# Patient Record
Sex: Male | Born: 1935 | Race: White | Hispanic: No | Smoking: Former smoker
Health system: Southern US, Community
[De-identification: ages and names within clinical notes are randomized; demographics above are authoritative.]

## PROBLEM LIST (undated history)

## (undated) DIAGNOSIS — M47812 Spondylosis without myelopathy or radiculopathy, cervical region: Secondary | ICD-10-CM

## (undated) DIAGNOSIS — M1990 Unspecified osteoarthritis, unspecified site: Secondary | ICD-10-CM

## (undated) DIAGNOSIS — I10 Essential (primary) hypertension: Secondary | ICD-10-CM

## (undated) DIAGNOSIS — K219 Gastro-esophageal reflux disease without esophagitis: Secondary | ICD-10-CM

## (undated) DIAGNOSIS — G4733 Obstructive sleep apnea (adult) (pediatric): Secondary | ICD-10-CM

## (undated) DIAGNOSIS — J449 Chronic obstructive pulmonary disease, unspecified: Secondary | ICD-10-CM

## (undated) DIAGNOSIS — I4891 Unspecified atrial fibrillation: Secondary | ICD-10-CM

## (undated) DIAGNOSIS — K5730 Diverticulosis of large intestine without perforation or abscess without bleeding: Secondary | ICD-10-CM

## (undated) DIAGNOSIS — C61 Malignant neoplasm of prostate: Secondary | ICD-10-CM

## (undated) HISTORY — DX: Spondylosis without myelopathy or radiculopathy, cervical region: M47.812

## (undated) HISTORY — DX: Essential (primary) hypertension: I10

## (undated) HISTORY — PX: COLONOSCOPY: SHX174

## (undated) HISTORY — PX: TOTAL KNEE ARTHROPLASTY: SHX125

## (undated) HISTORY — DX: Unspecified atrial fibrillation: I48.91

## (undated) HISTORY — DX: Chronic obstructive pulmonary disease, unspecified: J44.9

## (undated) HISTORY — DX: Malignant neoplasm of prostate: C61

## (undated) HISTORY — DX: Unspecified osteoarthritis, unspecified site: M19.90

## (undated) HISTORY — DX: Obstructive sleep apnea (adult) (pediatric): G47.33

## (undated) HISTORY — DX: Diverticulosis of large intestine without perforation or abscess without bleeding: K57.30

## (undated) HISTORY — DX: Gastro-esophageal reflux disease without esophagitis: K21.9

## (undated) HISTORY — PX: ROTATOR CUFF REPAIR: SHX139

---

## 2000-05-10 ENCOUNTER — Encounter: Payer: Self-pay | Admitting: Orthopedic Surgery

## 2000-05-13 ENCOUNTER — Inpatient Hospital Stay (HOSPITAL_COMMUNITY): Admission: RE | Admit: 2000-05-13 | Discharge: 2000-05-17 | Payer: Self-pay | Admitting: Orthopedic Surgery

## 2000-05-13 ENCOUNTER — Encounter: Payer: Self-pay | Admitting: Orthopedic Surgery

## 2004-06-05 ENCOUNTER — Ambulatory Visit: Payer: Self-pay | Admitting: Cardiology

## 2004-06-09 ENCOUNTER — Ambulatory Visit: Payer: Self-pay | Admitting: Cardiology

## 2004-06-11 ENCOUNTER — Ambulatory Visit: Payer: Self-pay | Admitting: Cardiology

## 2004-06-12 ENCOUNTER — Inpatient Hospital Stay (HOSPITAL_BASED_OUTPATIENT_CLINIC_OR_DEPARTMENT_OTHER): Admission: RE | Admit: 2004-06-12 | Discharge: 2004-06-12 | Payer: Self-pay | Admitting: Cardiology

## 2004-06-17 ENCOUNTER — Ambulatory Visit: Payer: Self-pay | Admitting: Cardiology

## 2004-07-09 ENCOUNTER — Inpatient Hospital Stay (HOSPITAL_COMMUNITY): Admission: RE | Admit: 2004-07-09 | Discharge: 2004-07-13 | Payer: Self-pay | Admitting: Orthopedic Surgery

## 2004-07-10 ENCOUNTER — Ambulatory Visit: Payer: Self-pay | Admitting: Physical Medicine & Rehabilitation

## 2007-03-07 ENCOUNTER — Ambulatory Visit: Payer: Self-pay | Admitting: Cardiology

## 2007-03-13 ENCOUNTER — Ambulatory Visit: Payer: Self-pay | Admitting: Cardiology

## 2007-04-20 ENCOUNTER — Ambulatory Visit: Payer: Self-pay | Admitting: Cardiology

## 2007-05-15 ENCOUNTER — Encounter: Payer: Self-pay | Admitting: Cardiology

## 2007-06-13 ENCOUNTER — Ambulatory Visit: Payer: Self-pay | Admitting: Cardiology

## 2007-07-21 ENCOUNTER — Encounter: Admission: RE | Admit: 2007-07-21 | Discharge: 2007-07-21 | Payer: Self-pay | Admitting: Orthopedic Surgery

## 2007-08-10 ENCOUNTER — Ambulatory Visit: Payer: Self-pay | Admitting: Cardiology

## 2007-08-16 ENCOUNTER — Encounter: Admission: RE | Admit: 2007-08-16 | Discharge: 2007-08-16 | Payer: Self-pay | Admitting: Orthopedic Surgery

## 2007-08-16 ENCOUNTER — Encounter: Payer: Self-pay | Admitting: Cardiology

## 2007-08-17 ENCOUNTER — Ambulatory Visit (HOSPITAL_BASED_OUTPATIENT_CLINIC_OR_DEPARTMENT_OTHER): Admission: RE | Admit: 2007-08-17 | Discharge: 2007-08-18 | Payer: Self-pay | Admitting: Orthopedic Surgery

## 2008-02-26 ENCOUNTER — Ambulatory Visit: Payer: Self-pay | Admitting: Cardiology

## 2008-09-09 ENCOUNTER — Encounter: Payer: Self-pay | Admitting: Cardiology

## 2008-10-11 ENCOUNTER — Ambulatory Visit: Payer: Self-pay | Admitting: Cardiology

## 2008-10-16 ENCOUNTER — Ambulatory Visit: Payer: Self-pay | Admitting: Cardiology

## 2008-10-21 ENCOUNTER — Encounter: Payer: Self-pay | Admitting: Cardiology

## 2008-10-30 ENCOUNTER — Encounter: Payer: Self-pay | Admitting: Cardiology

## 2008-11-01 ENCOUNTER — Encounter: Payer: Self-pay | Admitting: Physician Assistant

## 2008-12-26 ENCOUNTER — Ambulatory Visit (HOSPITAL_COMMUNITY): Admission: RE | Admit: 2008-12-26 | Discharge: 2008-12-26 | Payer: Self-pay | Admitting: Ophthalmology

## 2009-01-03 ENCOUNTER — Encounter: Payer: Self-pay | Admitting: Cardiology

## 2009-03-08 ENCOUNTER — Encounter: Payer: Self-pay | Admitting: Cardiology

## 2009-03-28 DIAGNOSIS — I4891 Unspecified atrial fibrillation: Secondary | ICD-10-CM

## 2009-04-28 ENCOUNTER — Ambulatory Visit: Payer: Self-pay | Admitting: Cardiology

## 2009-04-28 ENCOUNTER — Encounter: Payer: Self-pay | Admitting: Physician Assistant

## 2009-05-19 ENCOUNTER — Encounter: Payer: Self-pay | Admitting: Cardiology

## 2009-05-21 ENCOUNTER — Encounter: Payer: Self-pay | Admitting: Cardiology

## 2009-07-17 ENCOUNTER — Encounter: Payer: Self-pay | Admitting: Cardiology

## 2009-10-02 ENCOUNTER — Encounter: Payer: Self-pay | Admitting: Cardiology

## 2009-10-20 ENCOUNTER — Encounter: Payer: Self-pay | Admitting: Cardiology

## 2009-10-28 ENCOUNTER — Ambulatory Visit: Payer: Self-pay | Admitting: Cardiology

## 2009-11-20 ENCOUNTER — Encounter: Payer: Self-pay | Admitting: Cardiology

## 2009-12-03 ENCOUNTER — Encounter: Payer: Self-pay | Admitting: Cardiology

## 2010-02-16 ENCOUNTER — Ambulatory Visit: Payer: Self-pay | Admitting: Cardiology

## 2010-04-29 ENCOUNTER — Encounter: Payer: Self-pay | Admitting: Cardiology

## 2010-05-07 ENCOUNTER — Ambulatory Visit (HOSPITAL_COMMUNITY): Admission: RE | Admit: 2010-05-07 | Discharge: 2010-05-07 | Payer: Self-pay | Admitting: Ophthalmology

## 2010-07-27 ENCOUNTER — Ambulatory Visit
Admission: RE | Admit: 2010-07-27 | Discharge: 2010-07-27 | Payer: Self-pay | Source: Home / Self Care | Attending: Cardiology | Admitting: Cardiology

## 2010-07-27 ENCOUNTER — Encounter: Payer: Self-pay | Admitting: Cardiology

## 2010-07-28 NOTE — Assessment & Plan Note (Signed)
Summary: 3 MO FU AUG REMINDER-SRS   Visit Type:  Follow-up Primary Izaiha Lo:  Dr. Kirstie Peri   History of Present Illness: 77 year old male presents for followup. At his last visit, Lopressor was advanced for more optimal heart rate control. He has tolerated this change. Home blood pressure and heart rate checks show systolics ranging from 100 -110 in general, and heart rates in the 70s. He reports NYHA class II dyspnea on exertion, no chest pain, or progressive palpitations.  No reported bleeding problems on Coumadin.  He states he had pulmonary function tests done through the [facility] system, reportedly "okay," although Spiriva was suggested. He has preferred not to take this however, using Combivent as needed.  Mr. Bardon states that his wife passed away 3 weeks ago with cancer. We talked about this some day.   Preventive Screening-Counseling & Management  Alcohol-Tobacco     Smoking Status: quit     Year Quit: 1970  Current Medications (verified): 1)  Metoprolol Tartrate 50 Mg Tabs (Metoprolol Tartrate) .... Take 1/2 Tablet By Mouth Two Times A Day 2)  Allopurinol 300 Mg Tabs (Allopurinol) .... Take 1 Tablet By Mouth Once A Day 3)  Warfarin Sodium 5 Mg Tabs (Warfarin Sodium) .... Use As Directed 4)  Vitamin B-12 1000 Mcg Tabs (Cyanocobalamin) .... Take 1 Tablet By Mouth Once A Day 5)  Preservision Areds  Caps (Multiple Vitamins-Minerals) .... Take 1 Tablet By Mouth Once A Day 6)  Nexium 40 Mg Cpdr (Esomeprazole Magnesium) .... Take 1 Tablet By Mouth Once A Day 7)  Metoprolol Tartrate 100 Mg Tabs (Metoprolol Tartrate) .... Take 1/2 Tablet By Mouth Twice A Day in Addition To 1/2 of 50mg  Tablet Two Times A Day. 8)  Combivent 18-103 Mcg/act Aero (Ipratropium-Albuterol) .... Use As Directed As Needed  Allergies (verified): 1)  ! Sulfa  Comments:  Nurse/Medical Assistant: The patient's medication list and allergies were reviewed with the patient and were  updated in the Medication and Allergy Lists.  Past History:  Past Medical History: Last updated: 10/28/2009 Mild obstructive sleep apnea Probable COPD Osteoarthritis Atrial Fibrillation Normal coronaries by catheterization 2005  Social History: Last updated: 03/28/2009 Retired  Married  Tobacco Use - Former.  Alcohol Use - no  Review of Systems       The patient complains of dyspnea on exertion.  The patient denies anorexia, fever, chest pain, syncope, peripheral edema, hemoptysis, melena, and hematochezia.         Otherwise reviewed and negative.  Vital Signs:  Patient profile:   77 year old male Height:      70 inches Weight:      235 pounds BMI:     33.84 Pulse rate:   60 / minute BP sitting:   103 / 69  (left arm) Cuff size:   large  Vitals Entered By: Carlye Grippe (February 16, 2010 8:43 AM)  Nutrition Counseling: Patient's BMI is greater than 25 and therefore counseled on weight management options.  Physical Exam  Additional Exam:  Obese male in no acute distress.  HEENT: Conjunctiva and lids normal, oropharynx with moist mucosa. Neck: Supple, no elevated JVP. Lungs: Clear to auscultation, diminished, no wheezing. Cardiac: Irregularly irregular, no S3 or rub. Abdomen: Nontender, bowel sounds present, unable to palpate liver edge. Extremities: No pitting edema. Distal pulses are 2+.   Impression & Recommendations:  Problem # 1:  ATRIAL FIBRILLATION (ICD-427.31)  Symptomatically stable. Plan to continue Lopressor at 75 mg p.o. b.i.d. Refill given. No  other medication changes are made. I will see him back in 6 months.  His updated medication list for this problem includes:    Metoprolol Tartrate 25 Mg Tabs (Metoprolol tartrate) .[name]... Take one tablet by mouth twice a day    Warfarin Sodium 5 Mg Tabs (Warfarin sodium) ..... Use as directed    Metoprolol Tartrate 100 Mg Tabs (Metoprolol tartrate) .[name]... Take 1/2 tablet by mouth twice a day in addition to 1/2  of 50mg  tablet two times a day.  Patient Instructions: 1)  Your physician wants you to follow-up in: 6 months. You will receive a reminder letter in the mail one-two months in advance. If you don't receive a letter, please call our office to schedule the follow-up appointment. 2)  Your physician recommends that you continue on your current medications as directed. Please refer to the Current Medication list given to you today. Prescriptions: METOPROLOL TARTRATE 25 MG TABS (METOPROLOL TARTRATE) Take one tablet by mouth twice a day  #180 x 3   Entered by:   Cyril Loosen, RN, BSN   Authorized by:   Loreli Slot, MD, [facility]   Signed by:   Cyril Loosen, RN, BSN on 02/16/2010   Method used:   Electronically to        [facility]* (mail-order)             , [place]         Ph: [phone]       Fax: [phone]   RxID:   [id number]

## 2010-07-28 NOTE — Assessment & Plan Note (Signed)
Summary: 6 MO FU PER MAY REMINDER   Visit Type:  Follow-up Primary Provider:  Dr. Kirstie Peri   History of Present Illness: 77 year old male presents for a followup visit. He was seen at the [facility] system for followup evaluation, and is undergoing a battery of testing related to exertional fatigue. We have been managing him for persistent atrial fibrillation with the strategy of heart rate control and anticoagulation. He has previously documented normal coronary arteries.  He is to undergo pulmonary function testing. I note that he was referred for a standard treadmill test, with limited results indicating relatively rapid ventricular response in stage I limiting his functional capacity, however done off his beta blocker. Echocardiography results are not completely available, however preliminary findings suggest no significant left ventricular dysfunction. It was recommended that he increase his Lopressor to 100 mg p.o. b.i.d. He has not done this yet, being concerned about making such an adjustment.  Followup labs from 7 April revealed BUN 12, creatinine 0.8, AST 21, ALT 9, total cholesterol 141, triglycerides 83, HDL 37, LDL 87, potassium 3.8, TSH 1.2, hemoglobin 13.8.  Today I spoke with Nicholas [name] at length about his atrial fibrillation. It is certainly possible that rapid ventricular response with exertion it is at least partially responsible for his sensation of fatigue and shortness of breath. Over time he has not been shown to have any significant ischemic heart disease or left ventricular dysfunction. I asked him to go ahead with pulmonary function tests as planned, and we arrived at a plan to increase his Lopressor to 75 mg p.o. b.i.d. as an intermediate dose. He will keep an eye on his heart rate and blood pressure.   Current Medications (verified): 1)  Metoprolol Tartrate 50 Mg Tabs (Metoprolol Tartrate) .... Take 1 Tablet By Mouth Two Times A Day 2)   Allopurinol 300 Mg Tabs (Allopurinol) .... Take 1 Tablet By Mouth Once A Day 3)  Warfarin Sodium 5 Mg Tabs (Warfarin Sodium) .... Use As Directed 4)  Vitamin B-12 1000 Mcg Tabs (Cyanocobalamin) .... Take 1 Tablet By Mouth Once A Day 5)  Preservision Areds  Caps (Multiple Vitamins-Minerals) .... Take 1 Tablet By Mouth Once A Day 6)  Nexium 40 Mg Cpdr (Esomeprazole Magnesium) .... Take 1 Tablet By Mouth Once A Day 7)  Symbicort 80-4.5 Mcg/act Aero (Budesonide-Formoterol Fumarate) .... As Needed  Allergies (verified): 1)  ! Sulfa  Past History:  Social History: Last updated: 03/28/2009 Retired  Married  Tobacco Use - Former.  Alcohol Use - no  Past Medical History: Mild obstructive sleep apnea Probable COPD Osteoarthritis Atrial Fibrillation Normal coronaries by catheterization 2005  Review of Systems       The patient complains of dyspnea on exertion.  The patient denies anorexia, fever, chest pain, syncope, peripheral edema, prolonged cough, melena, and hematochezia.         Otherwise reviewed and negative.  Vital Signs:  Patient profile:   77 year old male Weight:      240 pounds Pulse rate:   82 / minute BP sitting:   101 / 70  (right arm)  Vitals Entered By: Dreama Saa, CNA (Oct 28, 2009 2:38 PM)  Physical Exam  Additional Exam:  Obese male in no acute distress.  HEENT: Conjunctiva and lids normal, oropharynx with moist mucosa. Neck: Supple, no elevated JVP. Lungs: Clear to auscultation, diminished, no wheezing. Cardiac: Irregularly irregular, no S3 or rub. Abdomen: Nontender, bowel sounds present, unable to palpate liver edge.  Extremities: No pitting edema. Distal pulses are 2+.   EKG  Procedure date:  10/28/2009  Findings:      Atrial fibrillation at 77 beats per minute with leftward axis, small R prime in lead V1, and nonspecific T-wave changes.  Impression & Recommendations:  Problem # 1:  ATRIAL FIBRILLATION (ICD-427.31)  Persistent/permanent  atrial fibrillation, rate control reasonable at rest, however possibly with rapid ventricular response on exertion contributing to fatigue and breathlessness. We have discussed this in the past. Recent testing done through the [facility] was reviewed. He does have pulmonary function tests pending. I asked him to increase his Lopressor to 75 mg p.o. b.i.d. using a combination of one half pills of his 50 mg and 100 mg doses, and keeping an eye on his blood pressure and heart rate over the next few weeks. He will let us know how he is doing, otherwise we will see him back in 3 months. He also plans to inform us of his final test results when available.  His updated medication list for this problem includes:    Metoprolol Tartrate 50 Mg Tabs (Metoprolol tartrate) .[name]... Take 1/2 tablet by mouth two times a day    Warfarin Sodium 5 Mg Tabs (Warfarin sodium) ..... Use as directed    Metoprolol Tartrate 100 Mg Tabs (Metoprolol tartrate) .[name]... Take 1/2 tablet by mouth twice a day in addition to 1/2 of 50mg  tablet two times a day.  Patient Instructions: 1)  Your physician wants you to follow-up in: 3 months. You will receive a reminder letter in the mail one-two months in advance. If you don't receive a letter, please call our office to schedule the follow-up appointment. 2)  Take Metoprolol 50mg  1/2 tablet two times a day in addition to 100mg  1/2 tablet two times a day for a total of 75mg  by mouth two times a day.

## 2010-07-28 NOTE — Letter (Signed)
Summary: [facility] Cardiology Consult  [facility] Cardiology Consult   Imported By: Cyril Loosen, RN, BSN 10/28/2009 15:36:04  _____________________________________________________________________  External Attachment:    Type:   Image     Comment:   External Document

## 2010-08-05 NOTE — Assessment & Plan Note (Signed)
Summary: 6 mo ful   Visit Type:  Follow-up Primary Provider:  Dr. Kirstie Peri   History of Present Illness: 77 year old male presents for followup. He was seen in August 2011. Continues to report somewhat decreased energy, however has stable NYHA class II dyspnea on exertion, no progressive palpitations, and no chest pain.  He reports no problems with bleeding on Coumadin, followed by Dr. Sherryll Burger. Also continue to followup with the [facility] medical system.  He presents home blood pressure and heart rate checks, no significant tachycardia or bradycardia. Generally good blood pressure control.  Current Medications (verified): 1)  Metoprolol Tartrate 25 Mg Tabs (Metoprolol Tartrate) .... Take One Tablet By Mouth Twice A Day 2)  Allopurinol 300 Mg Tabs (Allopurinol) .... Take 1 Tablet By Mouth Once A Day 3)  Warfarin Sodium 5 Mg Tabs (Warfarin Sodium) .... Use As Directed 4)  Vitamin B-12 1000 Mcg Tabs (Cyanocobalamin) .... Take 1 Tablet By Mouth Once A Day 5)  Preservision Areds  Caps (Multiple Vitamins-Minerals) .... Take 1 Tablet By Mouth Once A Day 6)  Nexium 40 Mg Cpdr (Esomeprazole Magnesium) .... Take 1 Tablet By Mouth Once A Day 7)  Metoprolol Tartrate 100 Mg Tabs (Metoprolol Tartrate) .... Take 1/2 Tablet By Mouth Twice A Day in Addition To 1/2 of 50mg  Tablet Two Times A Day. 8)  Combivent 18-103 Mcg/act Aero (Ipratropium-Albuterol) .... Use As Directed As Needed 9)  Vitamin D3 1000 Unit Tabs (Cholecalciferol) .... Take 1 Tablet By Mouth Once A Day 10)  Drisdol 54098 Unit Caps (Ergocalciferol) .... Take One By Mouth Weekly  Allergies (verified): 1)  ! Sulfa  Comments:  Nurse/Medical Assistant: The patient's medications and allergies were verbally reviewed with the patient and were updated in the Medication and Allergy Lists.  Past History:  Past Medical History: Last updated: 10/28/2009 Mild obstructive sleep apnea Probable COPD Osteoarthritis Atrial Fibrillation Normal  coronaries by catheterization 2005  Social History: Last updated: 03/28/2009 Retired  Married  Tobacco Use - Former.  Alcohol Use - no  Review of Systems       The patient complains of dyspnea on exertion.  The patient denies anorexia, fever, chest pain, syncope, peripheral edema, prolonged cough, hemoptysis, melena, hematochezia, and severe indigestion/heartburn.         Some knee pain status post bilateral knee replacements. Otherwise reviewed and negative.  Vital Signs:  Patient profile:   77 year old male Height:      70 inches Weight:      240 pounds Pulse rate:   90 / minute BP sitting:   118 / 83  (left arm) Cuff size:   large  Vitals Entered By: Carlye Grippe (July 27, 2010 8:43 AM)  Physical Exam  Additional Exam:  Obese male in no acute distress.  HEENT: Conjunctiva and lids normal, oropharynx with moist mucosa. Neck: Supple, no elevated JVP. Lungs: Clear to auscultation, diminished, no wheezing. Cardiac: Irregularly irregular, no S3 or rub. Abdomen: Nontender, bowel sounds present, unable to palpate liver edge. Extremities: No pitting edema. Distal pulses are 2+.   Impression & Recommendations:  Problem # 1:  ATRIAL FIBRILLATION (ICD-427.31)  Continue strategy of heart rate control and anticoagulation. No change in present dose of metoprolol. Followup in 6 months.  His updated medication list for this problem includes:    Metoprolol Tartrate 25 Mg Tabs (Metoprolol tartrate) .[name]... Take one tablet by mouth twice a day    Warfarin Sodium 5 Mg Tabs (Warfarin sodium) ..... Use as directed  Metoprolol Tartrate 100 Mg Tabs (Metoprolol tartrate) .[name]... Take 1/2 tablet by mouth twice a day in addition to 1/2 of 50mg  tablet two times a day.  Patient Instructions: 1)  Your physician wants you to follow-up in: 6 months. You will receive a reminder letter in the mail one-two months in advance. If you don't receive a letter, please call our office to schedule the  follow-up appointment. 2)  Your physician recommends that you continue on your current medications as directed. Please refer to the Current Medication list given to you today.

## 2010-08-19 NOTE — Progress Notes (Signed)
Summary: Office Visit/ BLOOD PRESURE READINGS  Office Visit/ BLOOD PRESURE READINGS   Imported By: Dorise Hiss 08/12/2010 15:35:46  _____________________________________________________________________  External Attachment:    Type:   Image     Comment:   External Document

## 2010-09-08 LAB — BASIC METABOLIC PANEL
CO2: 27 mEq/L (ref 19–32)
Chloride: 103 mEq/L (ref 96–112)
Creatinine, Ser: 0.93 mg/dL (ref 0.4–1.5)
GFR calc Af Amer: >60 mL/min (ref >60)
Potassium: 4.3 mEq/L (ref 3.5–5.1)

## 2010-09-08 LAB — HEMOGLOBIN AND HEMATOCRIT, BLOOD
HCT: 40.1 % (ref 39.0–52.0)
Hemoglobin: 13.3 g/dL (ref 13.0–17.0)

## 2010-10-05 LAB — BASIC METABOLIC PANEL
BUN: 14 mg/dL (ref 6–23)
Chloride: 102 mEq/L (ref 96–112)
GFR calc Af Amer: >60 mL/min (ref >60)
GFR calc non Af Amer: >60 mL/min (ref >60)
Potassium: 4.7 mEq/L (ref 3.5–5.1)
Sodium: 138 mEq/L (ref 135–145)

## 2010-10-05 LAB — HEMOGLOBIN AND HEMATOCRIT, BLOOD: Hemoglobin: 13.8 g/dL (ref 13.0–17.0)

## 2010-11-10 NOTE — Assessment & Plan Note (Signed)
[facility]                          [facility]ICE NOTE   Nicholas [name], Nicholas [name]                        MRN:          [id number]  DATE:02/26/2008                            DOB:          11/01/1928    REASON FOR VISIT:  Followup atrial fibrillation.   HISTORY OF PRESENT ILLNESS:  Nicholas [name] was last seen in the office back  in February.  He had a preoperative evaluation at that time prior to  right shoulder rotator cuff repair.  This was performed in the interim  and he is doing well.  He has NYHA class II dyspnea on exertion, which  has not changed and no significant chest pain.  He seems to be  tolerating his medicines well.  His electrocardiogram today shows atrial  fibrillation at 75 beats per minute with an old leftward axis and  decreased voltage.   ALLERGIES:  SULFA drugs.   MEDICATIONS:  1. Allopurinol 3 mg p.o. daily.  2. Coumadin as directed by Dr. Clelia Croft.  3. Vitamin B12 1000 mg p.o. daily.  4. PreserVision daily.  5. Nexium 40 mg p.o. daily.  6. Metoprolol 25 mg p.o. b.i.d.   REVIEW OF SYSTEMS:  As described in the history of present illness.  Otherwise, negative.  He is not having any bleeding problems.   PHYSICAL EXAMINATION:  VITAL SIGNS:  Blood pressure 112/71, heart rate  is 57 and irregular.  Weight is 249 pounds.  GENERAL:  He is an oversize and obese male, in no acute distress.  NECK:  No elevated jugular venous pressure.  No audible/loud bruits.  No  thyromegaly is noted.  LUNGS:  Clear without labored breathing at rest.  CARDIAC:  Irregularly irregular rhythm without pathologic murmur.  No S3  gallop.  EXTREMITIES:  Exhibit trace edema   IMPRESSION AND RECOMMENDATIONS:  Permanent atrial fibrillation with  preserved ejection fraction and previous documentation of normal  coronary arteries at catheterization in 2005.  He is doing well without  any major progressive symptomatology.  We will plan to continue  a  strategy of heart rate control and anticoagulation and I will see him  back over the next 6 months.     Jonelle Sidle, MD  Electronically Signed    SGM/MedQ  DD: 02/26/2008  DT: 02/27/2008  Job #: [id number]   cc:   Kirstie Peri, MD

## 2010-11-10 NOTE — Assessment & Plan Note (Signed)
[facility]                          [facility] NOTE   NAME:Nicholas [name], Nicholas [name]                        MRN:          [id number]  DATE:04/20/2007                            DOB:          Mar 08, 1929    PRIMARY CARE PHYSICIAN:  Dr. Kirstie Peri.   REASON FOR VISIT:  Followup atrial fibrillation.   HISTORY OF PRESENT ILLNESS:  Mr. Azzara comes back in for a routine  visit.  He has atrial fibrillation of uncertain duration, as discussed  in my previous note.  He continues on Coumadin and fairly low dose  metoprolol.  He tells me that, since his last visit, he saw a  cardiologist through the [facility], and  it was recommended that his metoprolol be increased.  They also  discussed the possibility of a cardioversion, although the patient was  really not willing to consider this.  He states that he still has  dyspnea on exertion, typically NYHA class II, but no chest pain.  I  spoke with him again about atrial fibrillation and discussed with him  more aggressive heart rate control.  We also reviewed the possibility of  cardioversion, which I think is not unreasonable, particularly if we are  not able to help his symptoms with heart rate control alone.  His  electrocardiogram today shows atrial fibrillation at 79 beats per minute  at rest, although his heart rate was over 100 after walking into the  room.   ALLERGIES:  Possibly SULFA drugs.   PRESENT MEDICATIONS:  1. Coumadin as directed for an INR of 2.0 to 3.0.  2. Allopurinol 300 mg p.o. daily.  3. Metoprolol 12.5 mg p.o. b.i.d.  4. Vitamin B12.  5. PreserVision.  6. Nexium 40 mg p.o. daily.   REVIEW OF SYSTEMS:  As described in the history of present illness.   EXAMINATION:  Blood pressure 132/84, heart rate 106 after walking into  the room, down to about 80 at rest.  Weight is 247 pounds.  The patient is comfortable and in no acute distress.  Examination of  the neck reveals no elevated jugular venous pressure.  LUNGS:  Clear without labored breathing.  CARDIAC:  Reveals an irregularly irregular rhythm without loud murmur or  gallop.  EXTREMITIES:  Exhibit no pitting edema.   IMPRESSION/RECOMMENDATIONS:  1. Persistent atrial fibrillation, not optimally rate-controlled,      associated with New York Heart Association class II dyspnea on      exertion.  I have asked Mr. Rands to increase his metoprolol to 25      mg p.o. b.i.d.  He already has a prescription for his.  He will      otherwise continue on Coumadin, and I would like to see him back      over the next few months for symptom review.  We could entertain an      elective cardioversion attempt if we are unable to get his symptoms      better controlled.  He is willing to consider this.  2. Otherwise, continue regular followup with Dr.  Vyas.  Of note, he      does mention sleeping poorly and also some degree of daytime      somnolence.  I wonder if a sleep study might be in order to exclude      obstructive sleep apnea.  This may also be contributing to problem      #1.  I asked him to discuss this with Dr. Sherryll Burger.     Jonelle Sidle, MD  Electronically Signed    SGM/MedQ  DD: 04/20/2007  DT: 04/21/2007  Job #: [id number]   cc:   Kirstie Peri, MD

## 2010-11-10 NOTE — Op Note (Signed)
NAME:  Nicholas [name], Nicholas [name] NO.:  [id number]   MEDICAL RECORD NO.:  [id number]          PATIENT TYPE:  AMB   LOCATION:  [facility]                          FACILITY:  [facility]   PHYSICIAN:  Loreta Ave, M.D. 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  Extensive,  chronic and retracted probably irreparable rotator cuff tear.   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  Extensive,  chronic and retracted probably irreparable rotator cuff tear with also  chronic long head biceps tendon rupture, grade II chondral changes  glenohumeral joint with degenerative labral tears of loose bodies.   PROCEDURE:  Right shoulder exam under anesthesia, arthroscopy,  debridement glenohumeral joint, rotator cuff labrum.  Acromioplasty CA  (coracoacromial) ligament release.  Open excision distal clavicle with  exuberant periarticular spurs from the acromion above and below as well  as clavicle.   SURGEON:  Loreta Ave, M.D.   ASSISTANT:  Genene Churn. Denton Meek.   ANESTHESIA:  General.   ESTIMATED BLOOD LOSS:  Minimal.   SPECIMENS:  None.   CULTURES:  None.   COMPLICATIONS:  None.   DRESSINGS:  Soft compressive sling.   PROCEDURE IN DETAIL:  The patient was brought to the operating room and  placed on the operating table in supine position.  After adequate  anesthesia had been obtained the right shoulder was examined.  Good  motion and good stability.  Placed in beach chair position on the  shoulder positioner, prepped and draped in the usual sterile fashion.  Three portals, anterior, posterior and lateral.  Shoulder entered with  blunt obturator.  Arthroscope introduced.  Shoulder  distended and  inspected.  Complete irreparable chronic retracted cuff tear entire  supraspinatus of the top off of the subscap and infraspinatus.  Debrided  back to healthy tissue level of the glenoid.  Grade II changes  throughout the shoulder debrided.  Chondral loose bodies, labral tears,  stump of the biceps removed from above type III acromion.  Bursa  resected.  Acromioplasty to a type I acromion releasing CA ligament with  cautery.  Distal clavicle grade IV changes.  Under side periarticular  spurs removed.  Adequacy of decompression confirmed.  Once that was  completed a longitudinal incision was made over the [facility] joint.  Deltopectoral fascia opened exposing the [facility] joint, distal clavicle as  well as the top the acromion.  Exuberant spurs all over the top of the  acromion.  They were pointing upward.  They were symptomatic.  They were  all removed.  Distal 1 cm of clavicle removed along with all  periarticular spurs there.  Adequacy of decompression and spur excision  confirmed.  The wound irrigated.  Deltopectoral fascia oversewn with  Vicryl.  Skin closed with subcutaneous subcuticular Vicryl.  Portals  closed with nylon.  Margins were injected with Marcaine.  Sterile  compressive dressing applied.  Sling applied.  Anesthesia reversed.  Brought to the recovery room.  Tolerated surgery well.  No  complications.      Loreta Ave, M.D.  Electronically Signed     DFM/MEDQ  D:  08/17/2007  T:  08/18/2007  Job:  [id number]

## 2010-11-10 NOTE — Assessment & Plan Note (Signed)
[facility]                          [facility]   NAME:[name], [name]                        MRN:          [id number]  DATE:03/07/2007                            DOB:          February 20, 1929    PRIMARY CARE PHYSICIAN:  Dr. Kirstie Peri.   REASON FOR VISIT:  Atrial fibrillation.   HISTORY OF PRESENT ILLNESS:  [name] is a pleasant 77 year old male,  last seen in our office back in December of 2005.  He was referred at  that time following an abnormal Cardiolite, and ultimately underwent a  cardiac catheterization demonstrating normal coronary arteries.  He was  felt to have, perhaps, some minimal degree of hypokinesis in the  inferior wall corresponding with his abnormality on Cardiolite, although  there was no evidence of obstructive disease and he was treated with  aspirin.  He has done well and we have not seen him back since that  time.   [name] is now referred following the diagnosis of atrial  fibrillation picked up during a routine visit back in June.  He has  noted some shortness of breath and irregularity to his heart rate at  times, but not consistently.  He has had no significant chest pain.  He  was initiated on Coumadin earlier in the summer, and also metoprolol.  He had an echocardiogram obtained through [facility] in July  demonstrating an ejection fraction of approximately 60% with no  segmental wall motion abnormalities.  Right ventricle was described as  being mildly hypokinetic.  There was mildly elevated pulmonary artery  systolic pressure.  Mild mitral and aortic regurgitation were noted as  well.   [name] has had no obvious bleeding problems on Coumadin, seems to be  tolerating it fairly well.  Today we discussed the pathophysiology of  atrial fibrillation, including thromboembolic risk factors, symptoms,  and potential for cardiomyopathy without adequate rate control.  I  reviewed his  medications which are fairly reasonable.  He did not take  his metoprolol this morning, and his heart rate at rest is in the 90-100  range.   ALLERGIES:  Possibly SULFA drugs.   PRESENT MEDICATIONS:  1. Coumadin as directed by Dr. Sherryll Burger.  2. Metoprolol 12.5 mg p.o. b.i.d.  3. Vitamin B12 supplements.  4. PreserVision.  5. Zantac p.r.n.   REVIEW OF SYSTEMS:  As described in the history of present illness,  otherwise negative.   PHYSICAL EXAMINATION:  Blood pressure is 148/87, heart rate is 100 and  irregular, weight is 244 pounds.  This is an obese male in no acute distress.  HEENT:  Conjunctivae are normal, oropharynx is clear.  NECK:  Supple, no elevated jugular venous pressure, no loud bruits, no  thyromegaly or thyroid tenderness.  LUNGS:  Clear without labored breathing.  CARDIAC EXAM:  Reveals an irregularly irregular rhythm with soft  systolic murmur, no S3 gallop or pericardial rub.  ABDOMEN:  Obese.  Normal bowel sounds, no tenderness.  EXTREMITIES:  Exhibit marked pitting edema, distal pulses are 1+.  SKIN:  Warm and dry.  MUSCULOSKELETAL:  No kyphosis is noted.  NEURO/PSYCHIATRIC:  The patient is alert and oriented x3.   A 12-lead electrocardiogram today shows atrial fibrillation with  decreased voltage, a heart rate of 99 beats per minute, decreased  anterior R-wave progression.   IMPRESSION AND RECOMMENDATIONS:  1. Atrial fibrillation of uncertain duration, diagnosed back in June      of this year.  Strategy of heart rate control and anticoagulation      is reasonable at this time.  I discussed this with the patient      today.  His heart rate control is not adequate although he did not      take his metoprolol today.  I have asked him to resume prior dose      of his metoprolol and come back in for a nurse check later in the      week.  We may need to advance his metoprolol further.      Symptomatically, he has noted an improvement in his shortness of       breath while on metoprolol.  Otherwise, he had previously normal      coronary arteries in 2005, so I doubt that we need to proceed with      any ischemic evaluation at this point.  His ejection fraction was      recently documented in the 60% range.  2. We will continue with the present plan and I will see him back over      the next month.     Jonelle Sidle, MD  Electronically Signed    SGM/MedQ  DD: 03/07/2007  DT: 03/07/2007  Job #: [id number]   cc:   Kirstie Peri, MD

## 2010-11-10 NOTE — Assessment & Plan Note (Signed)
[facility]                          [facility]   Lido, Maske AHMAAD NEIDHARDT                        MRN:          [id number]  DATE:10/11/2008                            DOB:          08-25-1928    PRIMARY CARE PHYSICIAN:  Kirstie Peri, MD   REASON FOR VISIT:  Followup atrial fibrillation.   HISTORY OF PRESENT ILLNESS:  Mr. Delillo returns for followup of his  permanent atrial fibrillation.  He continues on Coumadin without major  bleeding problems.  He has occasional bruising.  He reports generally  having decreased energy and fatigue, but no chest pain or major sense of  palpitations.  He has had previously documented normal coronary arteries  as of 2005.  He also reports some trouble with his balance, but he  stands up quickly.  He is remained on the present dose of metoprolol  with seemingly fairly good heart rate control.   ALLERGIES:  SULFA DRUGS.   PRESENT MEDICATIONS:  1. Allopurinol 300 mg p.o. daily.  2. Coumadin as directed by Dr. Sherryll Burger to achieve a therapeutic INR of      2.0-3.0.  3. Vitamin B12 1000 mg p.o. daily.  4. PreserVision once daily.  5. Nexium 40 mg p.o. daily.  6. Metoprolol 25 mg p.o. b.i.d.  7. Symbicort p.r.n.   REVIEW OF SYSTEMS:  Outlined above.  No dizziness or syncope.  Otherwise  reviewed and negative.   PHYSICAL EXAMINATION:  VITAL SIGNS:  Blood pressure is 128/79, heart  rate is 61 and irregular, weight is 251 pounds.  GENERAL:  An obese male in no acute distress.  HEENT:  Conjunctiva is normal.  Oropharynx is clear.  NECK:  Supple.  No elevated jugular venous pressure, no loud bruits, no  thyromegaly.  LUNGS:  Clear without labored breathing at rest.  CARDIOVASCULAR:  Irregularly irregular rhythm.  No S3 gallop.  PMI  indistinct.  ABDOMEN:  Obese.  Unable to palpate liver edge.  Bowel sounds present.  EXTREMITIES:  No Vonzell pitting edema.  Distal pulses are 1+.  SKIN:  Warm and dry.  MUSCULOSKELETAL:  No kyphosis noted.  NEUROPSYCHIATRIC:  The patient alert and oriented x3.  Affect is  appropriate.   IMPRESSION AND RECOMMENDATIONS:  Permanent atrial fibrillation,  seemingly well rate controlled on present dose of metoprolol and without  major bleeding problems on Coumadin.  He does complain of fatigue and  some balance issues.  I would like to exclude transient bradycardia or  pauses.  We will place a 24-hour Holter monitor to objectively document  heart rate variability with typical activities and can let him know if  we need to adjust his beta-blocker therapy.  Otherwise, we will have him  followup with Dr. Sherryll Burger and plan to see him back in 6 months.     Jonelle Sidle, MD  Electronically Signed    SGM/MedQ  DD: 10/11/2008  DT: 10/12/2008  Job #: [id number]   cc:   Kirstie Peri, MD

## 2010-11-10 NOTE — Assessment & Plan Note (Signed)
[facility]                          [facility]   Festus, Pursel COBEN GODSHALL                        MRN:          [id number]  DATE:08/10/2007                            DOB:          22-Apr-1929    REFERRING PHYSICIAN:  Loreta Ave, M.D.   PRIMARY CARDIOLOGIST:  Dr. Simona Huh.   PRIMARY CARE PHYSICIAN:  Dr. Doreen Beam.   REASON FOR REFERRAL:  Surgical clearance.   HISTORY OF PRESENT ILLNESS:  Mr. Knaus is a 77 year old male patient  with a history of permanent atrial fibrillation on chronic Coumadin  therapy who presents for further evaluation for upcoming right shoulder  surgery. He has severe osteoarthritis and is status post bilateral total  knee replacements and status post left rotator cuff surgery. He also has  significant arthritic changes in his hands. He has apparently been  plagued by significant right shoulder pain and the plan is to proceed  with right rotator cuff repair.   Mr. Colucci underwent cardiac catheterization in December 2005 after he  had a Cardiolite study that suggested an inferior defect. The cardiac  catheterization revealed normal coronary arteries. He was found to be in  atrial fibrillation on routine physical exam at the [place] a couple of years  ago. He has recently been studied for possible sleep apnea. He has yet  to followup with his primary care physician but a sleep study done  November 2008 revealed mild features of obstructive sleep apnea.   The patient notes no intercurrent development of chest pain since last  being seen by Dr. Diona Browner in December 2008. He does note dyspnea with  exertion. He describes NYHA class 2 symptoms. He denies orthopnea, PND  or pedal edema. He denies any exertional chest heaviness or tightness.  He denies any syncope or near syncope. For the most part, he denies any  palpitations. Occasionally when he climbs into bed at night he will feel  his heart beat for a few  brief seconds.   MEDICATIONS:  1. Allopurinol 300 mg daily.  2. Warfarin as directed by Dr. Clelia Croft.  3. Metoprolol 25 mg 1/2-tablet b.i.d.  4. B12.  5. PreserVision vitamin.  6. Nexium 40 mg daily.  7. Zantac p.r.n.  8. Combivent p.r.n.  9. Darvocet p.r.n.   ALLERGIES:  No reported allergies but there is a question of a history  of sulfa allergy.   SOCIAL HISTORY:  He is an ex-smoker. He quit many many years ago and  smoked for about 10 years.   FAMILY HISTORY:  Insignificant for premature CAD.   REVIEW OF SYSTEMS:  He denies fevers, chills, headache, sore throat,  melena, hematochezia, hematuria. The rest of his review of systems are  negative.   PHYSICAL EXAMINATION:  He is a well-nourished, well-nourished male in no  distress.  Blood pressure is 138/88, pulse 89, weight 251 pounds.  HEENT:  Normal.  NECK:  Without JVD.  LYMPH:  Without lymphadenopathy.  ENDOCRINE:  Without thyromegaly.  CARDIAC:  Normal S1, S2. Regular rate and rhythm without murmurs.  LUNGS:  With decreased breath  sounds bilaterally. No wheezing, no  rhonchi, no rales.  ABDOMEN:  Soft, nontender with normal active bowel sounds. No  organomegaly.  EXTREMITIES:  With trace ankle edema bilaterally.  NEUROLOGIC:  He is alert and oriented x3. Cranial nerves II-XII grossly  intact.  MUSCULOSKELETAL:  Reveals ulnar deviation at the metacarpal phalangeal  joints of the bilateral hands.    Electrocardiogram  reveals atrial fibrillation with a heart rate of 89.  Poor R wave progression, inferior Q waves. No significant change since  previous tracing dated April 20, 2007.   IMPRESSION:  1. Permanent atrial fibrillation.      a.     Fairly asymptomatic.      b.     Chronic Coumadin therapy - followed by Dr. Sherril Croon.  2. Good left ventricular function.  3. Normal coronary arteries by catheterization December 2005.      a.     Catheterization performed after a Cardiolite study revealing       an inferior  defect.  4. Mild obstructive sleep apnea.  5. Probable chronic obstructive pulmonary disease.  6. Osteoarthritis.      a.     Needs right rotator cuff surgery.   PLAN:  Mr. Beeney presents for preoperative clearance. From a  cardiovascular standpoint, he is doing well. He is having no symptoms  suggestive of ischemic heart disease. As noted above, he had a fairly  normal heart catheterization 3 years ago. He is treated with Coumadin  for permanent atrial fibrillation but is fairly asymptomatic. He has  chronic dyspnea with exertion that is probably multifactorial and  related to his obesity, debilitating arthritis and probable underlying  COPD. According to [facility] and AHA guidelines, he requires no further  cardiac workup prior to his noncardiac surgery. He should be at  acceptable risk. Regarding his Coumadin, there is no major indication  for bridging Lovenox. His Coumadin can be held preoperatively. This  should be restarted postoperatively when felt to be safe from a bleeding  standpoint by surgery. Our cardiology service will certainly be  available in the perioperative period as necessary. The patient was also  seen and examined by Dr. Diona Browner today. He will return to see Dr.  Diona Browner in this office in the next 6 months or sooner p.r.n.      Tereso Newcomer, PA-C  Electronically Signed      Jonelle Sidle, MD  Electronically Signed   SW/MedQ  DD: 08/10/2007  DT: 08/10/2007  Job #: [id number]   cc:   Doreen Beam, MD  Loreta Ave, M.D.  Lynne Logan, Surgery Coordinator

## 2010-11-10 NOTE — Assessment & Plan Note (Signed)
[facility]                          [facility]   Ervie, Mccard CAD[facility]                        MRN:          [id number]  DATE:06/13/2007                            DOB:          [birth year]    PRIMARY CARE PHYSICIAN:  Dr. Doreen Beam.   REASON FOR VISIT:  Follow up atrial fibrillation.   HISTORY OF PRESENT ILLNESS:  I saw Mr. O'Dell back in October.  He  remains in atrial fibrillation on examination today, although with a  controlled rate.  He has NYHA class-2 dyspnea on exertion which is  stable, and he has had no chest pain, as well as no sense of  palpitations.  He seems to be tolerating Coumadin and metoprolol at this  point.  We talked about the possibility of a cardioversion before,  although on revisiting this today, I do not get a strong sense that we  need to press forward, given his fairly mild symptoms.  He was  comfortable with this.   ALLERGIES:  Possibly to SULFA DRUGS.   CURRENT MEDICATIONS:  1. Allopurinol 300 mg p.o. daily.  2. Coumadin as directed by Dr. Sherril Croon.  3. Metoprolol 25 mg p.o. twice daily.  4. Vitamin B12, 1000 mg daily.  5. PreserVision.  6. Nexium 40 mg p.o. daily.   REVIEW OF SYSTEMS:  As described in the history of present illness.  The  patient states that he did undergo a sleep study and was told that this  was borderline.  He plans to follow up with Dr. Sherril Croon later this week  for a routine physical.   PHYSICAL EXAMINATION:  VITAL SIGNS:  Blood pressure 122/86, heart rate  78 and irregular, weight 250 pounds.  GENERAL:  This is an overweight male, in no acute distress.  NECK:  No elevated jugular venous pressure.  No loud bruits, no  thyromegaly, no nodes.  LUNGS:  Clear without labored breathing at rest.  HEART:  An irregularly irregular rhythm without S3 gallop.  EXTREMITIES:  No pitting edema.   IMPRESSION/PLAN:  !.  Persistent atrial fibrillation:  Heart rate is  better controlled on 25 mg  twice daily of metoprolol.  He will continue  his present regimen.  We will proceed with observation alone at this  point.  I do not anticipate an electrocardioversion at this time.  I  will see  him back in six months.  1. Continue followup with Dr. Sherril Croon.  He will plan to discuss his sleep      study at that time.     Jonelle Sidle, MD  Electronically Signed    SGM/MedQ  DD: 06/13/2007  DT: 06/13/2007  Job #: [id number]   cc:   Doreen Beam

## 2010-11-13 NOTE — Cardiovascular Report (Signed)
NAME:  Nicholas [name], Nicholas [name] NO.:  [id number]   MEDICAL RECORD NO.:  [id number]          PATIENT TYPE:  OIB   LOCATION:  6501                         FACILITY:  [facility]   PHYSICIAN:  Charlies Constable, M.D. [facility] DATE OF BIRTH:  14-Jan-1929   DATE OF PROCEDURE:  06/12/2004  DATE OF DISCHARGE:  06/12/2004                              CARDIAC CATHETERIZATION   CLINICAL HISTORY:  [name] is 77 years old and has a history of  hypertension and questionable history of remote heart attack by ECG.  He was  scheduled for a knee replacement by Dr. Richardson Landry and his EKG was abnormal  and he was referred for further evaluation.  Had a Cardiolite scan which  suggested and inferior defect with an overall ejection fraction of 57%.  He  was seen in consultation by Dr. Simona Huh and was set up for evaluation  with coronary angiography in the outpatient laboratory.   PROCEDURE:  The procedure was performed via the right femoral artery using  arterial sheath and 6 French preformed coronary catheters. A front wall  arterial puncture was performed and Omnipaque contrast was used.  Because of  dilated aortic root as assessed by passage of the catheters, we performed an  aortic root injection.  The patient tolerated the procedure well and left  the laboratory in satisfactory condition.   RESULTS:  Left main coronary artery:  The left main coronary was free of  significant disease.   Left anterior descending artery:  The left anterior descending artery gave  rise to diagonal branch and two septal perforators.  These and the LAD  proper were free of significant disease.   Circumflex artery:  The circumflex artery gave rise to a ramus branch,  marginal branch, and a posterior lateral branch.  These vessels were free of  significant disease.   Right coronary artery:  The right coronary artery was a small vessel that  gave rise to a conus branch, right ventricular branch, small posterior  descending branch, and small posterior lateral branch.  These vessels were  free of significant disease.   LEFT VENTRICULOGRAM:  The left ventriculogram performed in the RAO  projection showed overall very good wall motion.  There was very  questionable slight hypokinesis of the inferior wall although this could  have been normal.   HEMODYNAMIC DATA:  The aortic pressure was 111/66 with mean of 85 and left  ventricular pressure was 111/11.   CONCLUSIONS:  1.  Normal coronary angiography.  2.  Questionable very mild hypokinesis of the inferior wall.   RECOMMENDATIONS:  The patient has an ECG and a Cardiolite scan which  suggests and inferior infarction, but his coronary arteries do not have any  obstructive disease or visible plaque, and his inferior wall moves fairly  well.  There might be some very minimal hypokinesis.  I am not certain for the  reasons for his abnormal ECG scan, but I think his outlook after the  information we have from the catheterization is quite good and his cardiac  risk for knee surgery should be very  low.  I think it is okay to proceed  with knee surgery.       BB/MEDQ  D:  06/12/2004  T:  06/13/2004  Job:  [id number]   cc:   Jonelle Sidle, M.D. [facility], MD  Fax: [phone]   Regina Eck   Loreta Ave, M.D.  [address] [place]  [place] [address]  Fax: [phone]

## 2010-11-13 NOTE — Discharge Summary (Signed)
[place]  Patient:    Nicholas [name], Nicholas [name]                        MRN: [id number] Adm. Date:  [id number] Disc. Date: [id number] Attending:  Colbert Ewing Dictator:   Oris Drone. Petrarca, P.A.C.                           Discharge Summary  ADMISSION DIAGNOSIS:  Advanced degenerative joint disease of the right knee.  DISCHARGE DIAGNOSES: 1. Advanced degenerative joint disease of the right knee. 2. Hypertension.  PROCEDURE:  Right total knee replacement.  HISTORY OF PRESENT ILLNESS:  This is a 77 year old white male with long-standing pain in both knees, right greater than left.  He has failed conservative treatment.  Now has pain with activities of daily living as well as rest and nighttime pain.  Now indicated for right total knee replacement.  HOSPITAL COURSE:  A 77 year old white male admitted on May 13, 2000. After appropriate laboratory studies were obtained was taken to the operating room where he underwent a right total knee replacement.  He tolerated the procedure well.  He was placed pre and postoperatively on Ancef prophylaxis. Coumadin and heparin were started for antithrombotic prophylaxis.  Consults with PT, OT, and rehab were ordered.  Physical therapy for ambulation and weightbearing as tolerated on the right with his walker.  He had an unremarkable hospital course.  He had his PCA pump discontinued on May 15, 2000.  Case management was consulted for discharge planning.  Had an unremarkable hospital course, and was discharged on May 17, 2000, to return back to the office in 10 days for followup.  LABORATORY DATA:  Radiographic studies revealed a satisfactory appearance of post right total knee replacement.  Laboratory studies preoperatively showed a hemoglobin of 14.7, hematocrit 41.4%, white count 6800, platelets 249,000. Discharge hemoglobin was 10.5, hematocrit 30.6%, white count 7000, platelets 183,000.   Preoperative chemistries showed a sodium of 137, potassium 4.2, chloride 102, CO2 30, glucose 88, BUN 14, creatinine 0.9, calcium 9.2, total protein 6.3, albumin 3.8, AST 19, ALT 11, ALP 66, total bilirubin 0.8. Discharge chemistries showed a sodium of 139, potassium 4.0, chloride 100, CO2 32, glucose 106, BUN 9, creatinine 0.9, calcium 8.6.  Urinalysis was benign for voided urine.  Blood type was A+.  Antibody screen negative.  DISCHARGE MEDICATIONS: 1. Prescription for iron sulfate one tablet daily with meals. 2. Ambien 5 mg one p.o. q.h.s. p.r.n. sleep. 3. Percocet one or two q.4h. p.r.n. pain.  ACTIVITY:  Weightbearing as tolerated on the right leg with his walker.  DIET:  No restrictions.  WOUND CARE:  May change the dressing to the right knee on a daily basis.  FOLLOWUP:  In the office in 10 days for followup.  CONDITION ON DISCHARGE:  Improved. DD:  05/25/00 TD:  05/25/00 Job: [id number]

## 2010-11-13 NOTE — Op Note (Signed)
NAME:  Nicholas [name], Nicholas [name] NO.:  [id number]   MEDICAL RECORD NO.:  [id number]          PATIENT TYPE:  INP   LOCATION:  2899                         FACILITY:  [facility]   PHYSICIAN:  Loreta Ave, M.D. DATE OF BIRTH:  06/02/1929   DATE OF PROCEDURE:  07/09/2004  DATE OF DISCHARGE:                                 OPERATIVE REPORT   PREOPERATIVE DIAGNOSES:  End stage degenerative arthritis of left knee with  varus alignment and flexion contracture.   POSTOPERATIVE DIAGNOSES:  End stage degenerative arthritis of left knee with  varus alignment and flexion contracture.   OPERATION PERFORMED:  Left total knee replacement with soft tissue balancing  and medial capsular release.  Cemented #9 posterior stabilized Osteonics  femoral component.  Cemented #9 tibial component with a 12 mm posterior  stabilized Flex polyethylene insert.  Cemented recessed 28 mm patellar  component.   SURGEON:  Loreta Ave, M.D.   ASSISTANT:  Genene Churn. Denton Meek.   ANESTHESIA:  General.   ESTIMATED BLOOD LOSS:  Minimal.   TOURNIQUET TIME:  One hour and 25 minutes.   SPECIMENS:  Excised bone and soft tissue.   CULTURES:  None.   COMPLICATIONS:  None.   DRESSING:  Soft compressive with knee immobilizer.   DRAINS:  Hemovac times two.   DESCRIPTION OF PROCEDURE:  The patient was brought to the operating room and  after adequate anesthesia had been obtained, the left knee examined.  5  degree flexion contracture, 5 degrees varus correctable to neutral.  Flexion  about 100 degrees.  Stable ligaments.  Marked crepitus.  Tourniquet applied.  Leg prepped and draped in the usual sterile fashion.  Exsanguinated with  elevation and Esmarch.  Tourniquet inflated to 350 mmHg.  Straight incision  above the patella down to the tibial tubercle.  Medial parapatellar  arthrotomy.  Hemostasis with cautery.  Knee exposed.  Remnants of menisci,  cruciate ligaments, periarticular spurs, loose  bodies all removed.  Distal  femur exposed.  Intramedullary guide placed.  Distal cut removing 10 mm set  at 5 degrees of valgus.  Sized to a number 9 component.  Jigs put in place,  definitive cuts made.  Trial put in place and found to fit well.  Trial  removed.  Attention turned to the tibia.  The tibial spine removed with a  saw.  Intramedullary guide placed.  Proximal cut perpendicular to the shaft  with a 5 degree posterior slope cut removing 5 mm off the medial deficient  side.  This gave me a nice bed of good bone throughout.  All spurs removed.  Sized with #9 component.  Patella was then cleared of osteophytes, sized,  reamed and drilled for 28 mm recessed component.  All trials put in place.  With the 12 mm Flex insert on the tibia and medial capsular release, I had  full extension, full flexion, good stability, good patellofemoral tracking,  full flexion without liftoff.  Tibia was marked for rotation then hand  reamed.  All trials removed.  Copious irrigation with a pulse irrigating  device.  All recesses cleared of debris, spurs, loose fragments.  Cement was  prepared, placed on all components which was then firmly seated with  excessive cement removed.  Knee was reduced.  Once the cement had hardened,  the knee was re-examined.  Full extension, full flexion, good stability,  good patellofemoral tracking.  Hemovac was placed and brought out through  separate stab wound.  Arthrotomy was closed with #1 Vicryl.  Skin and  subcutaneous tissue with Vicryl and staples.  Margins of the wound and the  knee injected with Marcaine.  Hemovac clamped.  Sterile compressive dressing  applied.  Tourniquet deflated and removed.  Knee immobilizer applied.  Anesthesia reversed.  Brought to recovery room.  Tolerated surgery well.  No  complications.      Dani   DFM/MEDQ  D:  07/09/2004  T:  07/09/2004  Job:  [id number]

## 2010-11-13 NOTE — Op Note (Signed)
[place]  Patient:    Nicholas [name], Nicholas [name]                        MRN: [id number] Proc. Date: 05/13/00 Adm. Date:  [id number] Attending:  Colbert Ewing                           Operative Report  PREOPERATIVE DIAGNOSIS:  End-stage degenerative joint disease right knee with varus alignment and flexion contracture.  POSTOPERATIVE DIAGNOSIS:  End-stage degenerative joint disease right knee with varus alignment and flexion contracture.  PROCEDURE:  Right total knee replacement utilizing Osteonics prosthesis. Pressfit #9 femoral component, posterior-stabilizing type; cemented #9 tibial component with 12 mm polyethylene insert, posterior-stabilizing type; cemented recessed nonmetal-backed 26 mm patellar component; appropriate soft tissue balancing.  SURGEON:  Loreta Ave, M.D.  ASSISTANT:  Arlys John D. Petrarca, P.A.-C.  ANESTHESIA:  General.  ESTIMATED BLOOD LOSS:  Minimal.  SPECIMENS:  Excised bone and soft tissue.  CULTURES:  None.  COMPLICATIONS:  None.  DRESSING:  Soft compressive.  DRAIN:  x 2.  TOURNIQUET TIME:  1 hour 10 minutes.  DESCRIPTION OF PROCEDURE:  Patient brought to the operating room and placed on the operating table in the supine position.  After adequate anesthesia had been obtained, right knee examined.  A 5 degree flexion contracture, further flexion to 100.  Alignment in varus with grossly stable ligaments and relatively stiff knee.  Tourniquet applied, prepped and draped in the usual sterile fashion.  Exsanguinated with elevation and Esmarch, tourniquet inflated to 350 mmHg.  Straight incision above the patella, down to the tibial tubercle.  Medial parapatellar arthrotomy.  Knee exposed.  Marked grade 4 changes throughout.  Extensive periarticular spurring, remnants of cruciate ligament and menisci and excessive fat pad all removed.  Hypertrophic synovitis was removed.  Popliteal tendon preserved.  Distal  femur exposed. Intramedullary guide placed.  Distal cut removing 10 mm.  Sized for a #9 component.  Jigs put in place, definitive cuts made, protecting surrounding structures.  Cuts made for the posterior stabilizing component.  Trials put in place and found to fit well.  Recess was all examined to be sure all spurs had been removed.  Trials removed.  Tibia exposed.  Tibial spine removed with a saw.  Intramedullary guide placed.  Proximal cut set with a 5 degree posterior slope cut, removing 4 mm off the deficient medial sides.  All spurs removed. Sized for a #9 component.  Trials put in place on the tibia and femur.  With the 12 mm insert, I had excellent stability, alignment, and no lift-off with flexion.  Tibia was marked for rotation and then hand-reamed.  Patella was then sized, reamed, and drilled for a 26 mm component.  All trials put in place.  Excellent stability, alignment, and patellofemoral tracking after appropriate soft tissue release on the medial side to correct for the varus seen preoperatively.  All trials removed.  Copious irrigation with the pulse irrigating device.  Cement prepared, placed on tibial and patellar components, which were well-seated.  The excessive cement removed.  Polyethylene attached to the tibia.  Femoral component seated.  Knee reduced.  Good stability, alignment, and fixation.  Once the cement had hardened, the knee was re-examined with good alignment and stability and motion.  Wound irrigated. Hemovac was placed, brought out through a separate stab wounds.  Arthrotomy closed with #1 Vicryl, skin and  subcutaneous tissue with Vicryl and staples. Margins of the wound and knee injected with Marcaine.  Sterile compressive dressing applied.  Tourniquet deflated and removed.  Hemovac was clamped. Knee immobilizer applied.  Anesthesia reversed, brought to recovery room. Tolerated the surgery well with no complications. DD:  05/13/00 TD:  05/13/00 Job:  [id number]

## 2010-11-13 NOTE — Discharge Summary (Signed)
NAME:  Nicholas [name], Nicholas [name] NO.:  [id number]   MEDICAL RECORD NO.:  [id number]          PATIENT TYPE:  INP   LOCATION:  5036                         FACILITY:  [facility]   PHYSICIAN:  Loreta Ave, M.D. DATE OF BIRTH:  [birth year]   DATE OF ADMISSION:  07/09/2004  DATE OF DISCHARGE:  07/13/2004                                 DISCHARGE SUMMARY   DISCHARGE DIAGNOSES:  1.  Status post left total knee replacement on July 09, 2004.  2.  Hypertension.  3.  Gastroesophageal reflux disease.  4.  Gout.  5.  History of tobacco use.   HISTORY OF PRESENT ILLNESS:  This 77 year old white male with a history of  end-stage degenerative joint disease of the left knee and chronic pain,  presented to our office for a preoperative evaluation for a left total knee  replacement.  He had progressive worsening pain with a failed response to  conservative treatment.  A significant decrease in his daily activities due  to the ongoing complaint.  Preoperative x-rays showed an end-stage  degenerative joint disease with joint space narrowing, marginal spur  formation and subchondral sclerotic changes.   PRE-ADMISSION LABORATORY DATA:  WBC 6.8, hemoglobin 14.8, hematocrit 44.2,  platelets 241.  PT 13.0, INR 1.0, PTT 27.  Sodium 139, potassium 3.9,  chloride 101, CO2 of 29, glucose 87, BUN 15, creatinine 1.0.  Calcium 11.4,  total protein 6.8, albumin 4.0, AST 19, ALT 11, alkaline phosphatase 66, T-  bilirubin 0.9.  Urinalysis negative.   HOSPITAL COURSE:  On July 09, 2004, the patient was taken to the [facility] Operating Room, and a left total knee replacement  procedure was performed.  The surgeon was Loreta Ave, M.D. with  assistant Charlott Rakes, P.A.-C.  Anesthesia was general.  The EBL was  minimal.  The tourniquet time was 85 minutes.  Two Hemovac drains were  placed.  There were no surgical or anesthesia complications, and the patient  was transferred  to the recovery room in stable condition.   On July 10, 2004, the patient had good pain control.  No specific  complaints.  Temperature 98.8 degrees, pulse 87, respirations 20, blood  pressure 105/67.  Hemoglobin 11.6.  Electrolytes stable.  INR 1.1.  The  dressing was clean, dry and intact.  Neurovascularly intact.  The calf was  nontender.  Started on pharmacy protocol Coumadin.  Evaluated by PT and OT.  On July 11, 2004, the patient doing well.  No specific complaints.  Temperature 100.1 degrees, pulse 90, respirations 20, blood pressure 113/59.  Hemoglobin 10.5, lytes stable.  Minimal calf discomfort.  Dressing changed.  The Hemovac pulled.  The wound looks good.  No signs of infection.  Discontinue Foley and the morphine PCA.  On July 12, 2004, the patient  doing well.  Some nausea.  Voiding without difficulty.  Temperature 98.9  degrees, pulse 80, respirations 20, blood pressure 100/54.  INR 1.4.  Hemoglobin 10.2, lytes stable.  Discontinue Percocet and started on [employer], one tab p.o. q.4-6h. p.r.n.  Started K-Dur.  On July 13, 2004, good pain control.  No complaints of nausea.  Ambulated  down the hall.  No BM yet.  Temperature 98.6 degrees, respirations 20, pulse  80, blood pressure 116/58.  Sodium 131, K of 3.2, chloride 90, CO2 of 33,  BUN 8, creatinine 0.9, glucose 105.  INR 1.9.  The wound looked good.  Staples intact.  No signs of infection.  The calf is nontender.  Dulcolax  suppository given.  He is ready for discharge home after completing stairs.   DISCHARGE MEDICATIONS:  1.  Mepergan Forte one tab p.o. q.4-6h. p.r.n.  2.  Kay-Ciel 20 mEq p.o. daily.  3.  Coumadin x4 weeks for postoperative deep venous thrombosis prophylaxis.  4.  Resume previous home medications.   DISPOSITION:  Discharge home.   CONDITION ON DISCHARGE:  Good and stable.   DISCHARGE INSTRUCTIONS:  The patient will work with home health PT to  improve range of motion and  strengthening and ambulation.  Home health R.N.  to monitor PT and INR.  Dressing changes p.r.n.  Staples to be removed two  weeks postoperatively.   FOLLOWUP:  Will follow him up in the office in two weeks for a recheck.  Return sooner if needed.      DFM/MEDQ  D:  10/05/2004  T:  10/05/2004  Job:  [id number]

## 2010-12-09 ENCOUNTER — Encounter: Payer: Self-pay | Admitting: Cardiology

## 2011-01-06 ENCOUNTER — Encounter: Payer: Self-pay | Admitting: Cardiology

## 2011-01-06 ENCOUNTER — Ambulatory Visit (INDEPENDENT_AMBULATORY_CARE_PROVIDER_SITE_OTHER): Payer: Medicare Other | Admitting: Cardiology

## 2011-01-06 VITALS — BP 120/83 | HR 73 | Ht 69.0 in | Wt 240.8 lb

## 2011-01-06 DIAGNOSIS — I359 Nonrheumatic aortic valve disorder, unspecified: Secondary | ICD-10-CM

## 2011-01-06 DIAGNOSIS — R0602 Shortness of breath: Secondary | ICD-10-CM | POA: Insufficient documentation

## 2011-01-06 DIAGNOSIS — I4891 Unspecified atrial fibrillation: Secondary | ICD-10-CM

## 2011-01-06 MED ORDER — METOPROLOL TARTRATE 25 MG PO TABS: 25.0000 mg | ORAL_TABLET | Freq: Two times a day (BID) | ORAL | Status: DC

## 2011-01-06 NOTE — Assessment & Plan Note (Signed)
Overall stable. LVEF is normal. Mildly increased RVSP is not clearly implicated in his symptoms. Also some degree of COPD as likely. Coronaries were normal as of 2005.

## 2011-01-06 NOTE — Progress Notes (Signed)
Clinical Summary [name] is a 77 y.o.male presenting for followup. He was seen in January.  He reports general fatigue, stable despite exertion. No significant palpitations or chest pain. States he tries to go to [employer] and walk 3 laps around the store, at least 3 times a week for exercise.  Denies any major bleeding problems on Coumadin.  Recent lab work with Dr. Sherril Croon showed LDL 100, sodium 141, potassium 4.6, TSH 1.6, hemoglobin 13.7, platelets 179, BUN 14, creatinine 0.8.   Allergies  Allergen Reactions  . Sulfonamide Derivatives     REACTION: rash    Current outpatient prescriptions:albuterol-ipratropium (COMBIVENT) 18-103 MCG/ACT inhaler, Inhale 2 puffs into the lungs every 6 (six) hours as needed. UAD PRN , Disp: , Rfl: ;  allopurinol (ZYLOPRIM) 300 MG tablet, Take 300 mg by mouth daily.  , Disp: , Rfl: ;  Cholecalciferol (VITAMIN D3) 1000 UNITS tablet, Take 1,000 Units by mouth daily.  , Disp: , Rfl: ;  esomeprazole (NEXIUM) 40 MG capsule, Take 40 mg by mouth daily.  , Disp: , Rfl:  metoprolol (LOPRESSOR) 50 MG tablet, Take 50 mg by mouth 2 (two) times daily. Takes along with the 25mg  tablet to equal 75mg  total so he does not have to break in half. , Disp: , Rfl: ;  metoprolol tartrate (LOPRESSOR) 25 MG tablet, Take 1 tablet (25 mg total) by mouth 2 (two) times daily. Takes along with 50mg  tablet to equal 75mg  total., Disp: 180 tablet, Rfl: 3 Multiple Vitamins-Minerals (PRESERVISION AREDS) CAPS, Take 1 capsule by mouth daily.  , Disp: , Rfl: ;  vitamin B-12 (CYANOCOBALAMIN) 1000 MCG tablet, Take 1,000 mcg by mouth daily.  , Disp: , Rfl: ;  warfarin (COUMADIN) 5 MG tablet, Take 5 mg by mouth daily. UAD - managed by Dr. Sherryll Burger, Disp: , Rfl:   Past Medical History  Diagnosis Date  . Obstructive sleep apnea   . COPD (chronic obstructive pulmonary disease)   . Osteoarthritis   . Atrial fibrillation   . Dyspnea     Normal coronaries 2005    Social History Mr. Giannone reports that he  quit smoking about 45 years ago. His smoking use included Cigarettes. He has never used smokeless tobacco. Mr. Hackenberg reports that he does not drink alcohol.  Review of Systems Arthritic complaints, no orthopnea or PND, no significant peripheral edema. Otherwise negative.  Physical Examination Filed Vitals:   01/06/11 1258  BP: 120/83  Pulse: 73   Obese male in no acute distress.  HEENT: Conjunctiva and lids normal, oropharynx with moist mucosa.  Neck: Supple, no elevated JVP.  Lungs: Clear to auscultation, diminished, no wheezing.  Cardiac: Irregularly irregular, no S3 or rub.  Abdomen: Nontender, bowel sounds present, unable to palpate liver edge.  Extremities: No pitting edema. Distal pulses are 2+.   ECG Tracing from 12/10/2010 showed atrial fibrillation at 73 beats per minute with decreased R-wave progression, nonspecific T-wave changes.  Studies Echocardiogram through [facility] internal medicine 11/16/2010: LVEF 60-65%, mildly thickened mitral valve with mild mitral regurgitation, mild aortic valve sclerosis with mild aortic regurgitation, mild right atrial enlargement, mild tricuspid and pulmonic regurgitation with RVSP 40 mm mercury, mildly dilated ascending aorta, no effusion.  Problem List and Plan

## 2011-01-06 NOTE — Patient Instructions (Signed)
Your physician wants you to follow-up in: 6 months. You will receive a reminder letter in the mail one-two months in advance. If you don't receive a letter, please call our office to schedule the follow-up appointment. Your physician recommends that you continue on your current medications as directed. Please refer to the Current Medication list given to you today. 

## 2011-01-06 NOTE — Assessment & Plan Note (Signed)
Continue strategy of heart rate control and anticoagulation. No significant change in symptomatology.

## 2011-01-06 NOTE — Assessment & Plan Note (Signed)
Mildly sclerotic aortic valve without stenosis, mild aortic regurgitation noted with mild ascending aortic dilatation. Followup echocardiogram in one year.

## 2011-03-19 LAB — BASIC METABOLIC PANEL
BUN: 14
GFR calc non Af Amer: >60
Glucose, Bld: 95
Potassium: 4.4

## 2011-03-19 LAB — POCT HEMOGLOBIN-HEMACUE: Hemoglobin: 14

## 2011-07-29 ENCOUNTER — Encounter (INDEPENDENT_AMBULATORY_CARE_PROVIDER_SITE_OTHER): Payer: Self-pay | Admitting: *Deleted

## 2011-08-03 ENCOUNTER — Encounter: Payer: Self-pay | Admitting: Cardiology

## 2011-08-03 ENCOUNTER — Ambulatory Visit (INDEPENDENT_AMBULATORY_CARE_PROVIDER_SITE_OTHER): Payer: Medicare Other | Admitting: Cardiology

## 2011-08-03 VITALS — BP 110/73 | HR 63 | Ht 69.0 in | Wt 242.0 lb

## 2011-08-03 DIAGNOSIS — I4891 Unspecified atrial fibrillation: Secondary | ICD-10-CM

## 2011-08-03 MED ORDER — METOPROLOL TARTRATE 50 MG PO TABS: 50.0000 mg | ORAL_TABLET | Freq: Two times a day (BID) | ORAL | Status: DC

## 2011-08-03 MED ORDER — METOPROLOL TARTRATE 25 MG PO TABS: 25.0000 mg | ORAL_TABLET | Freq: Two times a day (BID) | ORAL | Status: DC

## 2011-08-03 NOTE — Assessment & Plan Note (Signed)
Symptomatically stable, heart rate control good, continues on Coumadin. No major changes to current regimen. Continue observation.

## 2011-08-03 NOTE — Patient Instructions (Signed)
Your physician wants you to follow-up in: 6 months. You will receive a reminder letter in the mail one-two months in advance. If you don't receive a letter, please call our office to schedule the follow-up appointment. Your physician recommends that you continue on your current medications as directed. Please refer to the Current Medication list given to you today. 

## 2011-08-03 NOTE — Progress Notes (Signed)
Clinical Summary Mr. Matsuura is a 78 y.o.male presenting for followup. He was seen in July. He denies any sense of palpitations, has had no exertional chest pain. Reports stable NYHA class II-III dyspnea on exertion at times. He admits that he has been less active during the winter months.  Reports a recent cough and cold-like symptoms, no fevers or chills. Reports compliance with medications, no major bleeding problems on Coumadin.  He does state that he has had difficulty sleeping, this is been a chronic problem by report. States he had a sleep study a few years ago with inconclusive results. He plans to discuss this further with Dr. Sherryll Burger.   Allergies  Allergen Reactions  . Sulfonamide Derivatives     REACTION: rash    Current Outpatient Prescriptions  Medication Sig Dispense Refill  . albuterol-ipratropium (COMBIVENT) 18-103 MCG/ACT inhaler Inhale 2 puffs into the lungs every 6 (six) hours as needed. UAD PRN       . allopurinol (ZYLOPRIM) 300 MG tablet Take 300 mg by mouth daily.        . Cholecalciferol (VITAMIN D3) 1000 UNITS tablet Take 1,000 Units by mouth daily.        [name] esomeprazole (NEXIUM) 40 MG capsule Take 40 mg by mouth daily.        [name] loratadine (CLARITIN) 10 MG tablet Take 10 mg by mouth daily.      . metoprolol (LOPRESSOR) 50 MG tablet Take 1 tablet (50 mg total) by mouth 2 (two) times daily. Take in addition to 25 mg tablets for total of 75 mg.  180 tablet  3  . metoprolol tartrate (LOPRESSOR) 25 MG tablet Take 1 tablet (25 mg total) by mouth 2 (two) times daily. Take in addition to 50 mg tablets for total of 75 mg.  180 tablet  3  . Multiple Vitamins-Minerals (PRESERVISION AREDS) CAPS Take 1 capsule by mouth 2 (two) times daily.       . vitamin B-12 (CYANOCOBALAMIN) 1000 MCG tablet Take 1,000 mcg by mouth daily.        [name] warfarin (COUMADIN) 5 MG tablet Take 5 mg by mouth daily. UAD - managed by Dr. Sherryll Burger        Past Medical History  Diagnosis Date  . Obstructive  sleep apnea   . COPD (chronic obstructive pulmonary disease)   . Osteoarthritis   . Atrial fibrillation   . Dyspnea     Normal coronaries 2005    Social History Mr. Mullany reports that he quit smoking about 46 years ago. His smoking use included Cigarettes. He has a 18 pack-year smoking history. He quit smokeless tobacco use about 44 years ago. His smokeless tobacco use included Chew. Mr. Galentine reports that he does not drink alcohol.  Review of Systems Otherwise negative except as outlined.  Physical Examination Filed Vitals:   08/03/11 0852  BP: 110/73  Pulse: 63    Obese male in no acute distress.  HEENT: Conjunctiva and lids normal, oropharynx with moist mucosa.  Neck: Supple, no elevated JVP.  Lungs: Clear to auscultation, diminished, no wheezing.  Cardiac: Irregularly irregular, no S3 or rub.  Abdomen: Nontender, bowel sounds present, unable to palpate liver edge.  Extremities: No pitting edema. Distal pulses are 2+.   ECG Atrial fibrillation at 57 with low voltage, nonspecific T wave changes.  Studies  Echocardiogram through [facility] internal medicine 11/16/2010:  LVEF 60-65%, mildly thickened mitral valve with mild mitral regurgitation, mild aortic valve sclerosis with mild aortic regurgitation,  mild right atrial enlargement, mild tricuspid and pulmonic regurgitation with RVSP 40 mm mercury, mildly dilated ascending aorta, no effusion.   Problem List and Plan

## 2011-08-19 ENCOUNTER — Encounter (INDEPENDENT_AMBULATORY_CARE_PROVIDER_SITE_OTHER): Payer: Self-pay | Admitting: *Deleted

## 2011-08-31 ENCOUNTER — Other Ambulatory Visit: Payer: Self-pay

## 2011-09-21 ENCOUNTER — Other Ambulatory Visit: Payer: Self-pay | Admitting: Urology

## 2011-09-21 ENCOUNTER — Ambulatory Visit (INDEPENDENT_AMBULATORY_CARE_PROVIDER_SITE_OTHER): Payer: Medicare Other | Admitting: Urology

## 2011-09-21 DIAGNOSIS — C61 Malignant neoplasm of prostate: Secondary | ICD-10-CM

## 2011-10-12 ENCOUNTER — Ambulatory Visit (HOSPITAL_COMMUNITY)
Admission: RE | Admit: 2011-10-12 | Discharge: 2011-10-12 | Disposition: A | Discharge status: Home / Self Care | Payer: Medicare Other | Source: Ambulatory Visit | Attending: Urology | Admitting: Urology

## 2011-10-12 ENCOUNTER — Other Ambulatory Visit: Payer: Self-pay | Admitting: Urology

## 2011-10-12 ENCOUNTER — Encounter: Payer: Self-pay | Admitting: Urology

## 2011-10-12 DIAGNOSIS — C61 Malignant neoplasm of prostate: Secondary | ICD-10-CM

## 2011-10-12 NOTE — Progress Notes (Unsigned)
Patient ID: Nicholas [name], male   DOB: December 14, 1928, 78 y.o.   MRN: [id number] The patient presents for transrectal ultrasound and biopsy of the prostate to followup prostate cancer/provide active surveillance. He has received preoperative instructions, fleets enema and has been off of his Coumadin. He has been prepared with Levaquin as well. He is aware of risks and complications of the procedure.  The patient was placed in the left lateral decubitus position with his knees flexed. Transrectal ultrasound probe was admitted into his rectum and serial scanning was performed. Prostate was approximately 68 cc in size with scattered cysts and hypoechoic areas. Seminal vesicles were normal bilaterally. Following scanning and infiltration of 12 cc of 2% plain lidocaine just lateral to his seminal vesicles, biopsies were taken x12 in the usual manner, 6 from each prostatic side, with 2biopsies each on the base of the prostate, in the mid and apex of the prostate. The patient tolerated the procedure well.

## 2011-10-12 NOTE — Discharge Instructions (Signed)
Prostate Biopsy  TRUS Biopsy  BEFORE THE TEST    Do not take aspirin. Do not take any medicine that has aspirin in it 7 days before your biopsy.    You may be given a medicine to take on the day of your biopsy.    You may also be given a medicine or treatment to help you go poop (laxative or enema).   AFTER THE TEST   Only take medicine as told by your doctor.    It is normal to have some bleeding from your rectum for the first 5 days.    You may have blood in your pee (urine) or sperm.   Finding out the results of your test  Ask when your test results will be ready. Make sure you get your test results.  GET HELP RIGHT AWAY IF:   You have a temperature by mouth above 102 F (38.9 C), not controlled by medicine.    You have blood in your pee for more than 5 days.    You have a lot of blood in your pee.    You have bleeding from your rectum for more than 5 days or have a lot of blood in your poop (feces).    You have severe pain.   Document Released: 06/02/2009 Document Revised: 06/03/2011 Document Reviewed: 06/02/2009  [employer] Patient Information 2012 [employer].

## 2011-10-12 NOTE — Progress Notes (Signed)
Twelve prostate biopsies taken by Dr. Retta Diones

## 2011-10-15 ENCOUNTER — Ambulatory Visit (INDEPENDENT_AMBULATORY_CARE_PROVIDER_SITE_OTHER): Payer: Medicare Other | Admitting: Urology

## 2011-10-15 DIAGNOSIS — R310 Gross hematuria: Secondary | ICD-10-CM

## 2011-10-15 DIAGNOSIS — N401 Enlarged prostate with lower urinary tract symptoms: Secondary | ICD-10-CM

## 2011-10-15 DIAGNOSIS — C61 Malignant neoplasm of prostate: Secondary | ICD-10-CM

## 2011-10-20 ENCOUNTER — Other Ambulatory Visit: Payer: Self-pay | Admitting: Radiation Oncology

## 2011-11-09 ENCOUNTER — Encounter: Payer: Self-pay | Admitting: Cardiology

## 2011-12-29 ENCOUNTER — Other Ambulatory Visit: Payer: Self-pay | Admitting: Orthopaedic Surgery

## 2011-12-29 DIAGNOSIS — M545 Low back pain: Secondary | ICD-10-CM

## 2012-01-07 ENCOUNTER — Ambulatory Visit
Admission: RE | Admit: 2012-01-07 | Discharge: 2012-01-07 | Disposition: A | Discharge status: Home / Self Care | Payer: Medicare Other | Source: Ambulatory Visit | Attending: Orthopaedic Surgery | Admitting: Orthopaedic Surgery

## 2012-01-07 DIAGNOSIS — M545 Low back pain: Secondary | ICD-10-CM

## 2012-02-11 ENCOUNTER — Encounter: Payer: Self-pay | Admitting: Cardiology

## 2012-02-11 ENCOUNTER — Ambulatory Visit (INDEPENDENT_AMBULATORY_CARE_PROVIDER_SITE_OTHER): Payer: Medicare Other | Admitting: Cardiology

## 2012-02-11 VITALS — BP 117/76 | HR 60 | Ht 69.0 in | Wt 242.8 lb

## 2012-02-11 DIAGNOSIS — I4891 Unspecified atrial fibrillation: Secondary | ICD-10-CM

## 2012-02-11 NOTE — Assessment & Plan Note (Signed)
Symptomatically stable on current regimen including beta blocker and Coumadin. Continue observation. No changes made today.

## 2012-02-11 NOTE — Progress Notes (Signed)
Clinical Summary [name] is an 78 y.o.male presenting for followup. He was seen in February. He reports no problems with progressive palpitations or shortness of breath over baseline. No reported bleeding problems on Coumadin.  He is currently completing radiation treatments for prostate cancer. He was given Flomax to use, has not started as yet. He does check his blood pressure at home, typically is in the normal range. We discussed possibility that his blood pressure may decrease on Flomax, possibility of orthostasis.   Allergies  Allergen Reactions  . Sulfonamide Derivatives     REACTION: rash    Current Outpatient Prescriptions  Medication Sig Dispense Refill  . albuterol-ipratropium (COMBIVENT) 18-103 MCG/ACT inhaler Inhale 2 puffs into the lungs every 6 (six) hours as needed. UAD PRN       . allopurinol (ZYLOPRIM) 300 MG tablet Take 300 mg by mouth daily.        . Cholecalciferol (VITAMIN D3) 1000 UNITS tablet Take 1,000 Units by mouth daily.        [name] esomeprazole (NEXIUM) 40 MG capsule Take 40 mg by mouth daily.        [name] loratadine (CLARITIN) 10 MG tablet Take 10 mg by mouth daily.      . metoprolol (LOPRESSOR) 50 MG tablet Take 1 tablet (50 mg total) by mouth 2 (two) times daily. Take in addition to 25 mg tablets for total of 75 mg.  180 tablet  3  . metoprolol tartrate (LOPRESSOR) 25 MG tablet Take 1 tablet (25 mg total) by mouth 2 (two) times daily. Take in addition to 50 mg tablets for total of 75 mg.  180 tablet  3  . Multiple Vitamins-Minerals (PRESERVISION AREDS) CAPS Take 1 capsule by mouth 2 (two) times daily.       . Tamsulosin HCl (FLOMAX) 0.4 MG CAPS Take 0.4 mg by mouth daily after supper.      . vitamin B-12 (CYANOCOBALAMIN) 1000 MCG tablet Take 1,000 mcg by mouth daily.        [name] warfarin (COUMADIN) 5 MG tablet Take 5 mg by mouth daily. UAD - managed by Dr. Sherryll Burger        Past Medical History  Diagnosis Date  . Obstructive sleep apnea   . COPD (chronic  obstructive pulmonary disease)   . Osteoarthritis   . Atrial fibrillation   . Malignant neoplasm of prostate   . Essential hypertension, benign   . Esophageal reflux   . Cervical spondylosis without myelopathy   . Diverticulosis of colon (without mention of hemorrhage)     Social History Mr. Emily reports that he quit smoking about 46 years ago. His smoking use included Cigarettes. He has a 18 pack-year smoking history. He quit smokeless tobacco use about 44 years ago. His smokeless tobacco use included Chew. Mr. Seehafer reports that he does not drink alcohol.  Review of Systems Has had trouble with leg pain and weakness, although states this has been better with physical therapy. Otherwise reviewed and negative.  Physical Examination Filed Vitals:   02/11/12 1432  BP: 117/76  Pulse: 60    Obese male in no acute distress.  HEENT: Conjunctiva and lids normal, oropharynx with moist mucosa.  Neck: Supple, no elevated JVP.  Lungs: Clear to auscultation, diminished, no wheezing.  Cardiac: Irregularly irregular, no S3 or rub.  Abdomen: Nontender, bowel sounds present, unable to palpate liver edge.  Extremities: No pitting edema. Distal pulses are 2+.    Problem List and Plan  ATRIAL FIBRILLATION Symptomatically stable on current regimen including beta blocker and Coumadin. Continue observation. No changes made today.    Jonelle Sidle, M.D., F.A.C.C.

## 2012-02-11 NOTE — Patient Instructions (Addendum)

## 2012-03-14 ENCOUNTER — Ambulatory Visit (INDEPENDENT_AMBULATORY_CARE_PROVIDER_SITE_OTHER): Payer: Medicare Other | Admitting: Urology

## 2012-03-14 DIAGNOSIS — R300 Dysuria: Secondary | ICD-10-CM

## 2012-03-28 ENCOUNTER — Ambulatory Visit (INDEPENDENT_AMBULATORY_CARE_PROVIDER_SITE_OTHER): Payer: Medicare Other | Admitting: Urology

## 2012-03-28 DIAGNOSIS — R300 Dysuria: Secondary | ICD-10-CM

## 2012-03-28 DIAGNOSIS — C61 Malignant neoplasm of prostate: Secondary | ICD-10-CM

## 2012-07-11 ENCOUNTER — Ambulatory Visit (INDEPENDENT_AMBULATORY_CARE_PROVIDER_SITE_OTHER): Payer: Medicare Other | Admitting: Urology

## 2012-07-11 DIAGNOSIS — N529 Male erectile dysfunction, unspecified: Secondary | ICD-10-CM

## 2012-07-11 DIAGNOSIS — C61 Malignant neoplasm of prostate: Secondary | ICD-10-CM

## 2012-07-28 ENCOUNTER — Other Ambulatory Visit: Payer: Self-pay | Admitting: Cardiology

## 2012-08-10 ENCOUNTER — Ambulatory Visit: Payer: Medicare Other | Admitting: Cardiology

## 2012-08-17 ENCOUNTER — Encounter: Payer: Self-pay | Admitting: Physician Assistant

## 2012-08-17 ENCOUNTER — Encounter: Payer: Self-pay | Admitting: *Deleted

## 2012-08-17 ENCOUNTER — Ambulatory Visit (INDEPENDENT_AMBULATORY_CARE_PROVIDER_SITE_OTHER): Payer: Medicare Other | Admitting: Physician Assistant

## 2012-08-17 ENCOUNTER — Telehealth: Payer: Self-pay | Admitting: Physician Assistant

## 2012-08-17 VITALS — BP 131/77 | HR 72 | Ht 69.0 in | Wt 243.4 lb

## 2012-08-17 DIAGNOSIS — R0602 Shortness of breath: Secondary | ICD-10-CM

## 2012-08-17 DIAGNOSIS — I4891 Unspecified atrial fibrillation: Secondary | ICD-10-CM

## 2012-08-17 DIAGNOSIS — R5381 Other malaise: Secondary | ICD-10-CM

## 2012-08-17 NOTE — Patient Instructions (Signed)
   Labs for CMET, CBC, TSH   Echo  GXT (Stress) Echo  Office will contact with results Continue all current medications. Follow up in  1-2 weeks

## 2012-08-17 NOTE — Assessment & Plan Note (Signed)
Adequately rate controlled on current medication regimen. On Coumadin anticoagulation, followed by Dr. Sherryll Burger.

## 2012-08-17 NOTE — Assessment & Plan Note (Signed)
Will evaluate further with a baseline echocardiogram as well as a GXT echocardiogram stress test. Patient has history of normal coronary arteries by prior cardiac catheterization in 2005. However, he has not had a repeat ischemic evaluation. Although he denies Chirag exertional CP, his exertional fatigue and dyspnea may suggest an anginal equivalent. On the other hand, it may be suggestive of chronotropic incompetence, which we will assess during the treadmill phase of the study. We'll also order extensive blood work, including TSH level, and arrange early followup with Dr. Diona Browner in the following 1-2 weeks.

## 2012-08-17 NOTE — Telephone Encounter (Signed)
GXT (Stress) Echo Weight 243  Diagnosis 427.31, 786.05, 780.79 Checking percert

## 2012-08-17 NOTE — Telephone Encounter (Signed)
Pt has Medicare and [employer] no precert required

## 2012-08-17 NOTE — Progress Notes (Signed)
Primary Cardiologist: Simona Huh, MD   HPI: Patient presents for 6 month followup.  He denies any interim development of exertional CP. However, he suggests progressive DOE and easy fatigueability. He denies significant palpitations.   12 lead EKG today, reviewed by me, reveals AF 70 bpm. No significant change from prior study, 07/2011  Allergies  Allergen Reactions  . Sulfonamide Derivatives     REACTION: rash    Current Outpatient Prescriptions  Medication Sig Dispense Refill  . albuterol-ipratropium (COMBIVENT) 18-103 MCG/ACT inhaler Inhale 2 puffs into the lungs every 6 (six) hours as needed. UAD PRN       . allopurinol (ZYLOPRIM) 300 MG tablet Take 300 mg by mouth daily.        . Cholecalciferol (VITAMIN D3) 1000 UNITS tablet Take 1,000 Units by mouth daily.        [name] esomeprazole (NEXIUM) 40 MG capsule Take 40 mg by mouth daily.        [name] loratadine (CLARITIN) 10 MG tablet Take 10 mg by mouth daily.      . metoprolol (LOPRESSOR) 50 MG tablet TAKE 1 TABLET BY MOUTH TWICE DAILY IN ADDITION TO 25 MG TABLET FOR A TOTAL OF 75 MG  180 tablet  1  . metoprolol tartrate (LOPRESSOR) 25 MG tablet Take 1 tablet (25 mg total) by mouth 2 (two) times daily. Take in addition to 50 mg tablets for total of 75 mg.  180 tablet  3  . Multiple Vitamins-Minerals (PRESERVISION AREDS) CAPS Take 1 capsule by mouth 2 (two) times daily.       . Tamsulosin HCl (FLOMAX) 0.4 MG CAPS Take 0.4 mg by mouth at bedtime.       . vitamin B-12 (CYANOCOBALAMIN) 1000 MCG tablet Take 1,000 mcg by mouth daily.        [name] warfarin (COUMADIN) 5 MG tablet Take 5 mg by mouth daily. UAD - managed by Dr. Sherryll Burger       No current facility-administered medications for this visit.    Past Medical History  Diagnosis Date  . Obstructive sleep apnea   . COPD (chronic obstructive pulmonary disease)   . Osteoarthritis   . Atrial fibrillation   . Malignant neoplasm of prostate   . Essential hypertension, benign   . Esophageal reflux    . Cervical spondylosis without myelopathy   . Diverticulosis of colon (without mention of hemorrhage)     Past Surgical History  Procedure Laterality Date  . Total knee arthroplasty    . Rotator cuff repair      History   Social History  . Marital Status: Widowed    Spouse Name: N/A    Number of Children: N/A  . Years of Education: N/A   Occupational History  . Retired    Social History Main Topics  . Smoking status: Former Smoker -- 1.00 packs/day for 18 years    Types: Cigarettes    Quit date: 06/28/1965  . Smokeless tobacco: Former Neurosurgeon    Types: Chew    Quit date: 06/29/1967  . Alcohol Use: No  . Drug Use: No  . Sexually Active: Not on file   Other Topics Concern  . Not on file   Social History Narrative  . No narrative on file    No family history on file.  ROS: no nausea, vomiting; no fever, chills; no melena, hematochezia; no claudication  PHYSICAL EXAM: BP 131/77  Pulse 72  Ht 5\' 9"  (1.753 m)  Wt 243 lb 6.4 oz (  110.406 kg)  BMI 35.93 kg/m2 GENERAL: 79 yo male; NAD HEENT: NCAT, PERRLA, EOMI; sclera clear; no xanthelasma NECK: palpable bilateral carotid pulses, no bruits; no JVD; no TM LUNGS: CTA bilaterally CARDIAC: RRR (S1, S2); no significant murmurs; no rubs or gallops ABDOMEN: soft, non-tender; intact BS EXTREMETIES: 2+ FPs, no femoral bruits; 2+ bilateral pitting edema SKIN: warm/dry; no obvious rash/lesions MUSCULOSKELETAL: no joint deformity NEURO: no focal deficit; NL affect   EKG: reviewed and available in Electronic Records   ASSESSMENT & PLAN:  Shortness of breath Will evaluate further with a baseline echocardiogram as well as a GXT echocardiogram stress test. Patient has history of normal coronary arteries by prior cardiac catheterization in 2005. However, he has not had a repeat ischemic evaluation. Although he denies Dencil exertional CP, his exertional fatigue and dyspnea may suggest an anginal equivalent. On the other hand,  it may be suggestive of chronotropic incompetence, which we will assess during the treadmill phase of the study. We'll also order extensive blood work, including TSH level, and arrange early followup with Dr. Diona Browner in the following 1-2 weeks.  ATRIAL FIBRILLATION Adequately rate controlled on current medication regimen. On Coumadin anticoagulation, followed by Dr. Sherryll Burger.    Gene Margree Gimbel, PAC

## 2012-08-24 ENCOUNTER — Other Ambulatory Visit (INDEPENDENT_AMBULATORY_CARE_PROVIDER_SITE_OTHER): Payer: Medicare Other

## 2012-08-24 ENCOUNTER — Other Ambulatory Visit: Payer: Self-pay

## 2012-08-24 DIAGNOSIS — I4891 Unspecified atrial fibrillation: Secondary | ICD-10-CM

## 2012-08-28 ENCOUNTER — Encounter (INDEPENDENT_AMBULATORY_CARE_PROVIDER_SITE_OTHER): Payer: Self-pay

## 2012-08-28 ENCOUNTER — Encounter (INDEPENDENT_AMBULATORY_CARE_PROVIDER_SITE_OTHER): Payer: Self-pay | Admitting: *Deleted

## 2012-08-28 ENCOUNTER — Telehealth: Payer: Self-pay | Admitting: *Deleted

## 2012-08-28 NOTE — Telephone Encounter (Signed)
Message copied by Lesle Chris on Mon Aug 28, 2012 11:58 AM ------      Message from: Rande Brunt      Created: Fri Aug 25, 2012  3:18 PM       EF 60-65%; no WMAs; mild MR/AR ------

## 2012-08-28 NOTE — Telephone Encounter (Signed)
Notes Recorded by Lesle Chris, LPN on 07/01/863 at 11:58 AM Patient notified. OV scheduled this Friday, 3/7 with Dr. Diona Browner.

## 2012-09-01 ENCOUNTER — Ambulatory Visit: Payer: Medicare Other | Admitting: Cardiology

## 2012-09-28 ENCOUNTER — Encounter: Payer: Self-pay | Admitting: Cardiology

## 2012-09-28 ENCOUNTER — Ambulatory Visit (INDEPENDENT_AMBULATORY_CARE_PROVIDER_SITE_OTHER): Payer: Medicare Other | Admitting: Cardiology

## 2012-09-28 VITALS — BP 122/72 | HR 68 | Ht 69.0 in | Wt 241.0 lb

## 2012-09-28 DIAGNOSIS — R0602 Shortness of breath: Secondary | ICD-10-CM

## 2012-09-28 DIAGNOSIS — I4891 Unspecified atrial fibrillation: Secondary | ICD-10-CM

## 2012-09-28 MED ORDER — METOPROLOL TARTRATE 25 MG PO TABS: 25.0000 mg | ORAL_TABLET | Freq: Two times a day (BID) | ORAL | Status: DC

## 2012-09-28 MED ORDER — METOPROLOL TARTRATE 50 MG PO TABS: ORAL_TABLET | ORAL | Status: DC

## 2012-09-28 NOTE — Assessment & Plan Note (Signed)
Continue strategy of heart rate control and anticoagulation. 

## 2012-09-28 NOTE — Assessment & Plan Note (Signed)
Chronic in nature. Recent ischemic workup reviewed, LVEF stable without major valvular abnormalities either. Lab work reviewed. Recommend continued activity as tolerated. No specific change in medical regimen.

## 2012-09-28 NOTE — Patient Instructions (Addendum)

## 2012-09-28 NOTE — Progress Notes (Signed)
Clinical Summary Mr. Eriksson is an 79 y.o.male last seen in the office in February by Mr. Serpe. At that time an echocardiogram was arranged as well as lab work.  Echocardiogram on 2/27 revealed LVEF 60-65%, mild aortic regurgitation, mildly dilated aortic root and ascending aorta, mild mitral regurgitation, moderate left atrial enlargement, moderately dilated right ventricle with normal function, moderate to severe right atrial enlargement without obvious PFO, mild tricuspid regurgitation, PASP 31 mm mercury. Exercise echocardiogram revealed limited exercise tolerance at adequate heart rate response with no diagnostic ST segment depression. Echocardiographic images revealed no ischemia in the apical 2 chamber view, other views were not adequate. Generally felt to be low risk on review.  Lab work at that time showed BUN 13, creatinine 0.9, normal AST and ALT, TSH 1.6, hemoglobin 14.1, platelets 157.  He reports chronic fatigue and stable dyspnea on exertion. He admits that there really has been no significant change over the last few years. We discussed his tests. He states that he is able to function in his ADLs, just has to take breaks with some chores. He is not reporting any major limiting palpitations or chest pain.   Allergies  Allergen Reactions  . Sulfonamide Derivatives     REACTION: rash    Current Outpatient Prescriptions  Medication Sig Dispense Refill  . albuterol-ipratropium (COMBIVENT) 18-103 MCG/ACT inhaler Inhale 2 puffs into the lungs every 6 (six) hours as needed. UAD PRN       . allopurinol (ZYLOPRIM) 300 MG tablet Take 300 mg by mouth daily.        . Cholecalciferol (VITAMIN D3) 1000 UNITS tablet Take 1,000 Units by mouth daily.        [name] esomeprazole (NEXIUM) 40 MG capsule Take 40 mg by mouth daily.        [name] loratadine (CLARITIN) 10 MG tablet Take 10 mg by mouth daily.      . metoprolol (LOPRESSOR) 50 MG tablet TAKE 1 TABLET BY MOUTH TWICE DAILY IN ADDITION TO 25 MG  TABLET FOR A TOTAL OF 75 MG  180 tablet  3  . metoprolol tartrate (LOPRESSOR) 25 MG tablet Take 1 tablet (25 mg total) by mouth 2 (two) times daily. Take in addition to 50 mg tablets for total of 75 mg.  180 tablet  3  . Multiple Vitamins-Minerals (PRESERVISION AREDS) CAPS Take 1 capsule by mouth 2 (two) times daily.       . Tamsulosin HCl (FLOMAX) 0.4 MG CAPS Take 0.4 mg by mouth at bedtime.       . vitamin B-12 (CYANOCOBALAMIN) 1000 MCG tablet Take 1,000 mcg by mouth daily.        [name] warfarin (COUMADIN) 5 MG tablet Take 5 mg by mouth daily. UAD - managed by Dr. Sherryll Burger       No current facility-administered medications for this visit.    Past Medical History  Diagnosis Date  . Obstructive sleep apnea   . COPD (chronic obstructive pulmonary disease)   . Osteoarthritis   . Atrial fibrillation   . Malignant neoplasm of prostate   . Essential hypertension, benign   . Esophageal reflux   . Cervical spondylosis without myelopathy   . Diverticulosis of colon (without mention of hemorrhage)     Social History Mr. Vanderweide reports that he quit smoking about 47 years ago. His smoking use included Cigarettes. He has a 18 pack-year smoking history. He quit smokeless tobacco use about 45 years ago. His smokeless tobacco use included Chew. Mr.  Elster reports that he does not drink alcohol.  Review of Systems Arthritic pains. Stable appetite. Trouble sleeping. Otherwise negative.  Physical Examination Filed Vitals:   09/28/12 0816  BP: 122/72  Pulse: 68   Filed Weights   09/28/12 0816  Weight: 241 lb (109.317 kg)    Obese male in no acute distress.  HEENT: Conjunctiva and lids normal, oropharynx with moist mucosa.  Neck: Supple, no elevated JVP.  Lungs: Clear to auscultation, diminished, no wheezing.  Cardiac: Irregularly irregular, no S3 or rub.  Abdomen: Nontender, bowel sounds present, unable to palpate liver edge.  Extremities: No pitting edema. Distal pulses are 2+.   Problem List  and Plan   ATRIAL FIBRILLATION Continue strategy of heart rate control and anticoagulation.  Shortness of breath Chronic in nature. Recent ischemic workup reviewed, LVEF stable without major valvular abnormalities either. Lab work reviewed. Recommend continued activity as tolerated. No specific change in medical regimen.    Jonelle Sidle, M.D., F.A.C.C.

## 2012-10-10 ENCOUNTER — Ambulatory Visit (INDEPENDENT_AMBULATORY_CARE_PROVIDER_SITE_OTHER): Payer: Medicare Other | Admitting: Urology

## 2012-10-10 DIAGNOSIS — C61 Malignant neoplasm of prostate: Secondary | ICD-10-CM

## 2012-10-10 DIAGNOSIS — N529 Male erectile dysfunction, unspecified: Secondary | ICD-10-CM

## 2013-02-13 ENCOUNTER — Ambulatory Visit (INDEPENDENT_AMBULATORY_CARE_PROVIDER_SITE_OTHER): Payer: Medicare Other | Admitting: Urology

## 2013-02-13 DIAGNOSIS — N529 Male erectile dysfunction, unspecified: Secondary | ICD-10-CM

## 2013-02-13 DIAGNOSIS — C61 Malignant neoplasm of prostate: Secondary | ICD-10-CM

## 2013-02-13 DIAGNOSIS — R39198 Other difficulties with micturition: Secondary | ICD-10-CM

## 2013-03-19 ENCOUNTER — Encounter: Payer: Self-pay | Admitting: Cardiology

## 2013-03-19 ENCOUNTER — Ambulatory Visit (INDEPENDENT_AMBULATORY_CARE_PROVIDER_SITE_OTHER): Payer: Medicare Other | Admitting: Cardiology

## 2013-03-19 VITALS — BP 118/76 | HR 62 | Ht 69.0 in | Wt 244.0 lb

## 2013-03-19 DIAGNOSIS — I4891 Unspecified atrial fibrillation: Secondary | ICD-10-CM

## 2013-03-19 DIAGNOSIS — I359 Nonrheumatic aortic valve disorder, unspecified: Secondary | ICD-10-CM

## 2013-03-19 MED ORDER — METOPROLOL TARTRATE 50 MG PO TABS: 50.0000 mg | ORAL_TABLET | Freq: Two times a day (BID) | ORAL | Status: DC

## 2013-03-19 NOTE — Progress Notes (Signed)
Clinical Summary Nicholas [name] is an 79 y.o.male last seen in April. He still reports fatigue and chronic shortness of breath. States that he had an incomplete sleep test through the [place] system, plans to have a followup study to investigate possible sleep apnea. He indicates that he does not sleep well. Interestingly, also reports seems to feel worse after he takes his medications, including Lopressor. Question whether some of his fatigue could be medication related.  Echocardiogram on 2/27 revealed LVEF 60-65%, mild aortic regurgitation, mildly dilated aortic root and ascending aorta, mild mitral regurgitation, moderate left atrial enlargement, moderately dilated right ventricle with normal function, moderate to severe right atrial enlargement without obvious PFO, mild tricuspid regurgitation, PASP 31 mm mercury. Exercise echocardiogram revealed limited exercise tolerance at adequate heart rate response with no diagnostic ST segment depression. Echocardiographic images revealed no ischemia in the apical 2 chamber view, other views were not adequate. Generally felt to be low risk on review.   Allergies  Allergen Reactions  . Sulfonamide Derivatives     REACTION: rash    Current Outpatient Prescriptions  Medication Sig Dispense Refill  . albuterol-ipratropium (COMBIVENT) 18-103 MCG/ACT inhaler Inhale 2 puffs into the lungs every 6 (six) hours as needed. UAD PRN       . allopurinol (ZYLOPRIM) 300 MG tablet Take 300 mg by mouth daily.        . Cholecalciferol (VITAMIN D3) 1000 UNITS tablet Take 1,000 Units by mouth daily.        [name] esomeprazole (NEXIUM) 40 MG capsule Take 40 mg by mouth daily.        [name] loratadine (CLARITIN) 10 MG tablet Take 10 mg by mouth daily.      . metoprolol (LOPRESSOR) 50 MG tablet TAKE 1 TABLET BY MOUTH TWICE DAILY IN ADDITION TO 25 MG TABLET FOR A TOTAL OF 75 MG  180 tablet  3  . metoprolol tartrate (LOPRESSOR) 25 MG tablet Take 1 tablet (25 mg total) by mouth 2 (two) times  daily. Take in addition to 50 mg tablets for total of 75 mg.  180 tablet  3  . Multiple Vitamins-Minerals (PRESERVISION AREDS) CAPS Take 1 capsule by mouth 2 (two) times daily.       . Tamsulosin HCl (FLOMAX) 0.4 MG CAPS Take 0.4 mg by mouth at bedtime.       . vitamin B-12 (CYANOCOBALAMIN) 1000 MCG tablet Take 1,000 mcg by mouth daily.        [name] warfarin (COUMADIN) 5 MG tablet Take 5 mg by mouth daily. UAD - managed by Dr. Sherryll Burger       No current facility-administered medications for this visit.    Past Medical History  Diagnosis Date  . Obstructive sleep apnea   . COPD (chronic obstructive pulmonary disease)   . Osteoarthritis   . Atrial fibrillation   . Malignant neoplasm of prostate   . Essential hypertension, benign   . Esophageal reflux   . Cervical spondylosis without myelopathy   . Diverticulosis of colon (without mention of hemorrhage)     Social History Nicholas [name] reports that he quit smoking about 47 years ago. His smoking use included Cigarettes. He has a 18 pack-year smoking history. He quit smokeless tobacco use about 45 years ago. His smokeless tobacco use included Chew. Nicholas [name] reports that he does not drink alcohol.  Review of Systems Otherwise negative.  Physical Examination Filed Vitals:   03/19/13 0950  BP: 118/76  Pulse: 62   Filed Weights  03/19/13 0950  Weight: 244 lb (110.678 kg)    Obese male in no acute distress.  HEENT: Conjunctiva and lids normal, oropharynx with moist mucosa.  Neck: Supple, no elevated JVP.  Lungs: Clear to auscultation, diminished, no wheezing.  Cardiac: Irregularly irregular, no S3 or rub.  Abdomen: Nontender, bowel sounds present,.  Extremities: No pitting edema. Distal pulses are 2+.   Problem List and Plan   Atrial fibrillation Will continue current regimen and observation, except reduce Lopressor to 50 mg twice daily from 75 mg twice daily. Question whether some of his fatigue may be medication related. I asked him  to call us back to let us know how he was doing. Continue anticoagulation. Followup arranged.  Aortic valve disorders Mild aortic regurgitation.    Jonelle Sidle, M.D., F.A.C.C.

## 2013-03-19 NOTE — Assessment & Plan Note (Signed)
Mild aortic regurgitation

## 2013-03-19 NOTE — Patient Instructions (Addendum)
   Decrease Lopressor to 50mg  twice a day   Continue all other medications.   Call office in 2 weeks to report how doing with medication change Isabelle Course - Dr.McDowell nurse) Your physician wants you to follow up in: 6 months.  You will receive a reminder letter in the mail one-two months in advance.  If you don't receive a letter, please call our office to schedule the follow up appointment

## 2013-03-19 NOTE — Assessment & Plan Note (Signed)
Will continue current regimen and observation, except reduce Lopressor to 50 mg twice daily from 75 mg twice daily. Question whether some of his fatigue may be medication related. I asked him to call us back to let us know how he was doing. Continue anticoagulation. Followup arranged.

## 2013-04-03 ENCOUNTER — Telehealth: Payer: Self-pay | Admitting: *Deleted

## 2013-04-03 NOTE — Telephone Encounter (Signed)
Patient called to inform MD how is doing since decrease in lopressor. Patient states that he feels good with dose decrease and hasn't had any problems with his blood pressure or heart rate. Nurse advised patient to continue monitoring symptoms and BP, HR numbers and if he notice any changes to give our office a call. Patient verbalized understanding.

## 2013-04-13 ENCOUNTER — Encounter (INDEPENDENT_AMBULATORY_CARE_PROVIDER_SITE_OTHER): Payer: Self-pay | Admitting: *Deleted

## 2013-05-21 ENCOUNTER — Encounter (INDEPENDENT_AMBULATORY_CARE_PROVIDER_SITE_OTHER): Payer: Self-pay | Admitting: Internal Medicine

## 2013-05-21 ENCOUNTER — Other Ambulatory Visit (INDEPENDENT_AMBULATORY_CARE_PROVIDER_SITE_OTHER): Payer: Self-pay | Admitting: *Deleted

## 2013-05-21 ENCOUNTER — Ambulatory Visit (INDEPENDENT_AMBULATORY_CARE_PROVIDER_SITE_OTHER): Payer: Medicare Other | Admitting: Internal Medicine

## 2013-05-21 VITALS — BP 120/70 | HR 68 | Temp 98.0°F | Ht 69.0 in | Wt 240.7 lb

## 2013-05-21 DIAGNOSIS — R1310 Dysphagia, unspecified: Secondary | ICD-10-CM

## 2013-05-21 NOTE — Progress Notes (Signed)
Subjective:     Patient ID: Nicholas [name], male   DOB: 06/26/1929, 79 y.o.   MRN: [id number]  HPI  Referred to our office by Dr. Sherryll Burger for dysphagia.  He says he feels raw in his esophagus. He has a lot of gas.  He says foods are lodging in his esophagus. He will stand up and walk around for the bolus to go down. Chicken breast and crackers feel like they are lodging. Beef also feels like it is lodging.  Hx of same and underwent an EGD in 2011.  See below. Appetite is good. No weight loss.  He usually has a BM 2-3 times a day. No melena or bright red rectal bleeding.   His last EGD/ED was in 2011. Small sliding hiatal hernia with wide open esophageal lumen and small hypoplastic appearing gastric polyps at fundus.  Dilated but no mucosal disruption noted. Suspect esophageal motility disorder.  Family hx of colon cancer in a sister age 85 or 34. 01/17/2013 Colonoscopy: Dr. Daphene Calamity colonic or rectal polyps. Pandiverticulosis. Redundant colon.   09/04/2008 Colonoscopy Dr Cleotis Nipper:  Family hx of colon cancer. Personal hx of colonic polyps. 5mm sessile polyp in the sigmoid colon. 8mm four polyps in the ascending colon. 8mm sessile polyp in the cecum. Repeat in 2 yrs. Biopsy: Tubular adenoma.  Review of Systems   Retired from [employer]. One son. Wife deceased from cancer.  Current Outpatient Prescriptions  Medication Sig Dispense Refill  . albuterol-ipratropium (COMBIVENT) 18-103 MCG/ACT inhaler Inhale 2 puffs into the lungs every 6 (six) hours as needed. UAD PRN       . allopurinol (ZYLOPRIM) 300 MG tablet Take 300 mg by mouth daily.        . Cholecalciferol (VITAMIN D3) 1000 UNITS tablet Take 1,000 Units by mouth daily.        [name] esomeprazole (NEXIUM) 40 MG capsule Take 40 mg by mouth daily.        [name] loratadine (CLARITIN) 10 MG tablet Take 10 mg by mouth daily.      . metoprolol (LOPRESSOR) 50 MG tablet Take 1 tablet (50 mg total) by mouth 2 (two) times daily. Decreased dose 03/19/2013       . Multiple Vitamins-Minerals (PRESERVISION AREDS) CAPS Take 1 capsule by mouth 2 (two) times daily.       . Tamsulosin HCl (FLOMAX) 0.4 MG CAPS Take 0.4 mg by mouth at bedtime.       . vitamin B-12 (CYANOCOBALAMIN) 1000 MCG tablet Take 1,000 mcg by mouth daily.        [name] warfarin (COUMADIN) 5 MG tablet Take 5 mg by mouth daily. UAD - managed by Dr. Sherryll Burger       No current facility-administered medications for this visit.   Past Medical History  Diagnosis Date  . Obstructive sleep apnea   . COPD (chronic obstructive pulmonary disease)   . Osteoarthritis   . Atrial fibrillation   . Malignant neoplasm of prostate   . Essential hypertension, benign   . Esophageal reflux   . Cervical spondylosis without myelopathy   . Diverticulosis of colon (without mention of hemorrhage)    Past Surgical History  Procedure Laterality Date  . Total knee arthroplasty    . Rotator cuff repair     Allergies  Allergen Reactions  . Sulfonamide Derivatives     REACTION: rash       Objective:   Physical Exam  Filed Vitals:   05/21/13 1447  BP: 120/70  Pulse:  68  Temp: 98 F (36.7 C)  Height: 5\' 9"  (1.753 m)  Weight: 240 lb 11.2 oz (109.181 kg)  Alert and oriented. Skin warm and dry. Oral mucosa is moist.   . Sclera anicteric, conjunctivae is pink. Thyroid not enlarged. No cervical lymphadenopathy. Lungs clear. Heart regular rate and rhythm.  Abdomen is soft. Bowel sounds are positive. No hepatomegaly. No abdominal masses felt. No tenderness.  No edema to lower extremities. Patient is alert and oriented.      Assessment:   Dysphagia to solids. Esophageal web needs to be ruled out.     Plan:    EGD/ED with Dr. Karilyn Cota. The risks and benefits such as perforation, bleeding, and infection were reviewed with the patient and is agreeable.

## 2013-05-21 NOTE — Patient Instructions (Signed)
EGD/ED. The risks and benefits such as perforation, bleeding, and infection were reviewed with the patient and is agreeable. 

## 2013-06-07 ENCOUNTER — Encounter (HOSPITAL_COMMUNITY): Payer: Self-pay | Admitting: Pharmacy Technician

## 2013-06-18 ENCOUNTER — Encounter (HOSPITAL_COMMUNITY): Payer: Self-pay

## 2013-06-18 ENCOUNTER — Ambulatory Visit (HOSPITAL_COMMUNITY)
Admission: RE | Admit: 2013-06-18 | Discharge: 2013-06-18 | Disposition: A | Discharge status: Home / Self Care | Payer: Medicare Other | Source: Ambulatory Visit | Attending: Internal Medicine | Admitting: Internal Medicine

## 2013-06-18 ENCOUNTER — Encounter (HOSPITAL_COMMUNITY)
Admission: RE | Disposition: A | Discharge status: Home / Self Care | Payer: Self-pay | Source: Ambulatory Visit | Attending: Internal Medicine

## 2013-06-18 DIAGNOSIS — K449 Diaphragmatic hernia without obstruction or gangrene: Secondary | ICD-10-CM

## 2013-06-18 DIAGNOSIS — D131 Benign neoplasm of stomach: Secondary | ICD-10-CM | POA: Insufficient documentation

## 2013-06-18 DIAGNOSIS — R1310 Dysphagia, unspecified: Secondary | ICD-10-CM

## 2013-06-18 DIAGNOSIS — K219 Gastro-esophageal reflux disease without esophagitis: Secondary | ICD-10-CM | POA: Insufficient documentation

## 2013-06-18 HISTORY — PX: ESOPHAGOGASTRODUODENOSCOPY (EGD) WITH ESOPHAGEAL DILATION: SHX5812

## 2013-06-18 SURGERY — ESOPHAGOGASTRODUODENOSCOPY (EGD) WITH ESOPHAGEAL DILATION
Anesthesia: Moderate Sedation

## 2013-06-18 MED ORDER — ONDANSETRON HCL 4 MG/2ML IJ SOLN
INTRAMUSCULAR | Status: DC | PRN
  Administered 2013-06-18: 4 mg via INTRAVENOUS

## 2013-06-18 MED ORDER — MIDAZOLAM HCL 5 MG/5ML IJ SOLN
INTRAMUSCULAR | Status: DC | PRN
  Administered 2013-06-18: 2 mg via INTRAVENOUS
  Administered 2013-06-18 (×3): 1 mg via INTRAVENOUS

## 2013-06-18 MED ORDER — BUTAMBEN-TETRACAINE-BENZOCAINE 2-2-14 % EX AERO
INHALATION_SPRAY | CUTANEOUS | Status: DC | PRN
  Administered 2013-06-18: 2 via TOPICAL

## 2013-06-18 MED ORDER — MEPERIDINE HCL 50 MG/ML IJ SOLN
INTRAMUSCULAR | Status: AC
  Filled 2013-06-18: qty 1

## 2013-06-18 MED ORDER — MEPERIDINE HCL 25 MG/ML IJ SOLN
INTRAMUSCULAR | Status: DC | PRN
  Administered 2013-06-18 (×2): 25 mg via INTRAVENOUS

## 2013-06-18 MED ORDER — SODIUM CHLORIDE 0.9 % IV SOLN
INTRAVENOUS | Status: DC
  Administered 2013-06-18: 07:00:00 via INTRAVENOUS

## 2013-06-18 MED ORDER — ONDANSETRON HCL 4 MG/2ML IJ SOLN
INTRAMUSCULAR | Status: AC
  Filled 2013-06-18: qty 2

## 2013-06-18 MED ORDER — STERILE WATER FOR IRRIGATION IR SOLN
Status: DC | PRN
  Administered 2013-06-18: 08:00:00

## 2013-06-18 MED ORDER — MIDAZOLAM HCL 5 MG/5ML IJ SOLN
INTRAMUSCULAR | Status: AC
  Filled 2013-06-18: qty 10

## 2013-06-18 NOTE — Op Note (Addendum)
EGD PROCEDURE REPORT  PATIENT:  Nicholas [name]  MR#:  [id number] Birthdate:  1928-08-18, 79 y.o., male Endoscopist:  Dr. Malissa Hippo, MD Referred By:  Dr. Kirstie Peri, MD Procedure Date: 06/18/2013  Procedure:   EGD with ED.  Indications:  Patient is a 79 year old Caucasian male who has chronic GERD who presents with intermittent solid food dysphagia.            Informed Consent:  The risks, benefits, alternatives & imponderables which include, but are not limited to, bleeding, infection, perforation, drug reaction and potential missed lesion have been reviewed.  The potential for biopsy, lesion removal, esophageal dilation, etc. have also been discussed.  Questions have been answered.  All parties agreeable.  Please see history & physical in medical record for more information.  Medications:  Demerol 50 mg IV Versed 5 mg IV Ondansetron 4 mg IV for nausea. Cetacaine spray topically for oropharyngeal anesthesia  Description of procedure:  The endoscope was introduced through the mouth and advanced to the second portion of the duodenum without difficulty or limitations. The mucosal surfaces were surveyed very carefully during advancement of the scope and upon withdrawal.  Findings:  Esophagus:  Mucosa of the esophagus was normal. GE junction was unremarkable without ring or stricture formation. GEJ:  38 cm Hiatus:  40 cm Stomach:  Stomach was empty and distended very well with insufflation. Few small hyperplastic appearing polyps are noted at gastric body along with one at fundus and one at antrum. No erosions or ulcers were present. Pyloric channel was patent. Angularis was unremarkable. Duodenum:  Normal bulbar and post bulbar mucosa.  Therapeutic/Diagnostic Maneuvers Performed:   Esophagus dilated by passing 56 French Maloney dilator to full insertion. As the dilator was withdrawn endoscope was passed again and mucosa was reexamined. No mucosal disruption noted.  Complications:   None  Impression: Small sliding hiatal hernia. No evidence of esophageal stricture or ring. Few small hyperplastic appearing gastric polyps. These were left alone. Esophagus dilated by passing 56 French Maloney dilator to full insertion but no mucosal disruption noted.  Comment; Since no esophageal ring or stricture identified he may have esophageal motility disorder. Need for further workup will depend on clinical course.  Recommendations:  Standard instructions given. Patient advised to resume warfarin at usual dose and get INR checked in 7-10 days. Patient advised to eat slowly and chew his food thoroughly. Patient advised to call office with progress report next week.  REHMAN,NAJEEB U  06/18/2013  8:26 AM  CC: Dr. Kirstie Peri, MD & Dr. Bonnetta Barry ref. provider found

## 2013-06-18 NOTE — H&P (Signed)
Nicholas [name] is an 79 y.o. male.   Chief Complaint: Patient's here for EGD and ED. HPI: Patient is a-year-old Caucasian male who presents with several month history of dysphagia primarily to solids particularly to meats. He points to mid sternal area as site of bolus obstruction. Patient states his heartburn is well controlled with Nexium. He denies nausea vomiting abdominal pain melena anorexia or weight loss. Patient has been off warfarin for 5 days. Patient's last EGD and ED was over 3 years at  [facility].  Past Medical History  Diagnosis Date  . Obstructive sleep apnea   . COPD (chronic obstructive pulmonary disease)   . Osteoarthritis   . Atrial fibrillation   . Malignant neoplasm of prostate   . Essential hypertension, benign   . Esophageal reflux   . Cervical spondylosis without myelopathy   . Diverticulosis of colon (without mention of hemorrhage)     Past Surgical History  Procedure Laterality Date  . Total knee arthroplasty    . Rotator cuff repair      History reviewed. No pertinent family history. Social History:  reports that he quit smoking about 48 years ago. His smoking use included Cigarettes. He has a 18 pack-year smoking history. He quit smokeless tobacco use about 46 years ago. His smokeless tobacco use included Chew. He reports that he does not drink alcohol or use illicit drugs.  Allergies:  Allergies  Allergen Reactions  . Sulfonamide Derivatives     REACTION: rash    Medications Prior to Admission  Medication Sig Dispense Refill  . albuterol-ipratropium (COMBIVENT) 18-103 MCG/ACT inhaler Inhale 2 puffs into the lungs every 6 (six) hours as needed. UAD PRN       . allopurinol (ZYLOPRIM) 300 MG tablet Take 300 mg by mouth daily.        . Cholecalciferol (VITAMIN D3) 1000 UNITS tablet Take 1,000 Units by mouth daily.        [name] esomeprazole (NEXIUM) 40 MG capsule Take 40 mg by mouth daily.        . metoprolol (LOPRESSOR) 50 MG tablet Take 1 tablet (50 mg total)  by mouth 2 (two) times daily. Decreased dose 03/19/2013      . Multiple Vitamins-Minerals (PRESERVISION AREDS) CAPS Take 1 capsule by mouth 2 (two) times daily.       . Tamsulosin HCl (FLOMAX) 0.4 MG CAPS Take 0.4 mg by mouth at bedtime.       . vitamin B-12 (CYANOCOBALAMIN) 1000 MCG tablet Take 1,000 mcg by mouth daily.        [name] warfarin (COUMADIN) 5 MG tablet Take 2.5-5 mg by mouth See admin instructions. Take 5 mg on Mondays and Fridays.  Take 2.5 mg all other days.  UAD - managed by Dr. Sherryll Burger        No results found for this or any previous visit (from the past 48 hour(s)). No results found.  ROS  Blood pressure 137/87, pulse 66, temperature 97.6 F (36.4 C), temperature source Oral, resp. rate 19, height 5\' 9"  (1.753 m), weight 240 lb (108.863 kg), SpO2 96.00%. Physical Exam  Constitutional: He appears well-developed and well-nourished.  HENT:  Mouth/Throat: Oropharynx is clear and moist.  Eyes: Conjunctivae are normal. No scleral icterus.  Neck: No thyromegaly present.  Cardiovascular:  !rregular rhythm normal S1 and S2 and no murmur noted   Respiratory: Effort normal and breath sounds normal.  GI: Soft. He exhibits no distension and no mass. There is no tenderness.  Musculoskeletal: He  exhibits no edema.  Lymphadenopathy:    He has no cervical adenopathy.  Neurological: He is alert.  Skin: Skin is warm and dry.     Assessment/Plan Solid food dysphagia. Chronic GERD. EGD an ED.  REHMAN,NAJEEB U 06/18/2013, 7:49 AM

## 2013-06-19 ENCOUNTER — Encounter (INDEPENDENT_AMBULATORY_CARE_PROVIDER_SITE_OTHER): Payer: Self-pay

## 2013-06-25 ENCOUNTER — Encounter (HOSPITAL_COMMUNITY): Payer: Self-pay | Admitting: Internal Medicine

## 2013-07-09 ENCOUNTER — Telehealth (INDEPENDENT_AMBULATORY_CARE_PROVIDER_SITE_OTHER): Payer: Self-pay | Admitting: *Deleted

## 2013-07-09 NOTE — Telephone Encounter (Signed)
Patient reports improvement in dysphagia since esophageal dilation. Patient complains of postprandial diarrhea not better with probiotic Advised he try Metamucil 4 g by mouth daily. Can take Imodium OTC 2 mg daily when necessary. If not better will need office visit.

## 2013-07-09 NOTE — Telephone Encounter (Signed)
Nicholas [name] would like to get the test results from his procedure done on 06/18/14. The return phone number is [phone].

## 2013-07-09 NOTE — Telephone Encounter (Signed)
Spoke with pt- informed him that NUR did not do any biopsies when his egd was done. Also informed him that NUR had wanted a progress report 1 week after his procedure. Pt stated he was doing good with his swallowing but he was having problems with diarrhea after eating, R side pain at times, and a lot of gas. No blood in his stool, no N/V, no fever. He stated this has been going on for at least a year. He wants to know if he should just come in for a follow up appointment with NUR?

## 2013-08-21 ENCOUNTER — Ambulatory Visit: Payer: Medicare Other | Admitting: Urology

## 2013-08-28 ENCOUNTER — Ambulatory Visit (INDEPENDENT_AMBULATORY_CARE_PROVIDER_SITE_OTHER): Payer: Medicare Other | Admitting: Urology

## 2013-08-28 DIAGNOSIS — C61 Malignant neoplasm of prostate: Secondary | ICD-10-CM

## 2013-09-04 ENCOUNTER — Other Ambulatory Visit: Payer: Self-pay | Admitting: Cardiology

## 2013-09-19 ENCOUNTER — Ambulatory Visit (INDEPENDENT_AMBULATORY_CARE_PROVIDER_SITE_OTHER): Payer: Medicare Other | Admitting: Cardiology

## 2013-09-19 ENCOUNTER — Encounter: Payer: Self-pay | Admitting: Cardiology

## 2013-09-19 VITALS — BP 118/77 | HR 72 | Ht 69.0 in | Wt 240.0 lb

## 2013-09-19 DIAGNOSIS — I359 Nonrheumatic aortic valve disorder, unspecified: Secondary | ICD-10-CM

## 2013-09-19 DIAGNOSIS — I4891 Unspecified atrial fibrillation: Secondary | ICD-10-CM

## 2013-09-19 NOTE — Assessment & Plan Note (Signed)
Mild aortic regurgitation. Asymptomatic. 

## 2013-09-19 NOTE — Assessment & Plan Note (Signed)
Continue current regimen including Lopressor and Coumadin. Followup in 6 months.

## 2013-09-19 NOTE — Progress Notes (Signed)
Clinical Summary Nicholas [name] is an 80 y.o.male last seen in September 2014. At the last visit we reduced Lopressor dose to see if this may have been contributing to any fatigue. He does report some improvement. Review of his blood pressure and heart rate check at home shows that he still has adequate control. No tachycardia or palpitations on lower dose Lopressor.  Denies any bleeding problems on Coumadin which is followed by [name].  Echocardiogram in February 2014 revealed LVEF 60-65%, mild aortic regurgitation, mildly dilated aortic root and ascending aorta, mild mitral regurgitation, moderate left atrial enlargement, moderately dilated right ventricle with normal function, moderate to severe right atrial enlargement without obvious PFO, mild tricuspid regurgitation, PASP 31 mm mercury. Exercise echocardiogram revealed limited exercise tolerance at adequate heart rate response with no diagnostic ST segment depression. Echocardiographic images revealed no ischemia in the apical 2 chamber view, other views were not adequate. Generally felt to be low risk on review.   Allergies  Allergen Reactions  . Sulfonamide Derivatives     REACTION: rash    Current Outpatient Prescriptions  Medication Sig Dispense Refill  . albuterol-ipratropium (COMBIVENT) 18-103 MCG/ACT inhaler Inhale 2 puffs into the lungs every 6 (six) hours as needed. UAD PRN       . allopurinol (ZYLOPRIM) 300 MG tablet Take 300 mg by mouth daily.        . Cholecalciferol (VITAMIN D3) 1000 UNITS tablet Take 1,000 Units by mouth daily.        [name] esomeprazole (NEXIUM) 40 MG capsule Take 40 mg by mouth daily.        [name] loratadine (CLARITIN) 10 MG tablet Take 10 mg by mouth daily.      . metoprolol (LOPRESSOR) 50 MG tablet Take 1 tablet (50 mg total) by mouth 2 (two) times daily.  180 tablet  3  . Multiple Vitamins-Minerals (PRESERVISION AREDS) CAPS Take 1 capsule by mouth 2 (two) times daily.       . Tamsulosin HCl (FLOMAX) 0.4 MG  CAPS Take 0.4 mg by mouth at bedtime.       . vitamin B-12 (CYANOCOBALAMIN) 1000 MCG tablet Take 1,000 mcg by mouth daily.        [name] warfarin (COUMADIN) 5 MG tablet Take 2.5-5 mg by mouth See admin instructions. Take 5 mg on Mondays and Fridays.  Take 2.5 mg all other days.  UAD - managed by [name]       No current facility-administered medications for this visit.    Past Medical History  Diagnosis Date  . Obstructive sleep apnea   . COPD (chronic obstructive pulmonary disease)   . Osteoarthritis   . Atrial fibrillation   . Malignant neoplasm of prostate   . Essential hypertension, benign   . Esophageal reflux   . Cervical spondylosis without myelopathy   . Diverticulosis of colon (without mention of hemorrhage)     Social History Nicholas [name] reports that he quit smoking about 48 years ago. His smoking use included Cigarettes. He has a 18 pack-year smoking history. He quit smokeless tobacco use about 46 years ago. His smokeless tobacco use included Chew. Nicholas [name] reports that he does not drink alcohol.  Review of Systems Negative except as outlined.  Physical Examination Filed Vitals:   09/19/13 1127  BP: 118/77  Pulse: 72   Filed Weights   09/19/13 1127  Weight: 240 lb (108.863 kg)    Obese male in no acute distress.  HEENT: Conjunctiva and lids  normal, oropharynx with moist mucosa.  Neck: Supple, no elevated JVP.  Lungs: Clear to auscultation, diminished, no wheezing.  Cardiac: Irregularly irregular, no S3 or rub.  Abdomen: Nontender, bowel sounds present,.  Extremities: No pitting edema. Distal pulses are 2+.   Problem List and Plan   Atrial fibrillation Continue current regimen including Lopressor and Coumadin. Followup in 6 months.  Aortic valve disorders Mild aortic regurgitation. Asymptomatic.    Satira Sark, M.D., F.A.C.C.

## 2013-09-19 NOTE — Patient Instructions (Signed)
Your physician recommends that you schedule a follow-up appointment in: 6 months with Dr. Domenic Polite. You should receive a letter in the mail in 4 months. If you do not receive this letter by July 2015 call our office to schedule this appointment.   Your physician recommends that you continue on your current medications as directed. Please refer to the Current Medication list given to you today.

## 2013-11-12 ENCOUNTER — Telehealth: Payer: Self-pay | Admitting: *Deleted

## 2013-11-12 NOTE — Telephone Encounter (Signed)
Home health nurse left message on nurse's vm r/e BP on patient this morning of 90/60. She also said that when patient went to hospital the last time, it was 90/60 as well. Called and spoke with patient to get more details. Patient said he felt fine. No c/o dizziness, chest pain or sob. Patient said that about 45 minutes after nurse took BP this morning, his BP went up to 130/70. Patient encouraged to continue monitoring his BP and that MD would be notified.

## 2013-11-20 ENCOUNTER — Telehealth: Payer: Self-pay | Admitting: *Deleted

## 2013-11-20 NOTE — Telephone Encounter (Signed)
Patient seen this morning by home health and nurse called to report BP left arm 100/58 & right arm 104/66. No c/o dizziness, chest pain or sob. Patient's only complaint today is that he felt tired. Per nurse, on 11/13/13, PCP decreased metoprolol tartrate to 25 mg bid.

## 2013-11-26 ENCOUNTER — Telehealth: Payer: Self-pay | Admitting: *Deleted

## 2013-11-26 NOTE — Telephone Encounter (Signed)
Spoke with patient to check on his symptoms. Patient said he does have dizziness at times about 2-3 times per day. Patient c/o sob sometimes after getting ready for bed. No chest pain. Nurse gave patient an appointment to come in for an evaluation.

## 2013-11-26 NOTE — Telephone Encounter (Signed)
BP right arm 88/50  HR 76 BP left arm    110/70 HR 76  BP recheck right arm 94/56    BP recheck right arm after wound care 88/58  Metoprolol 25 mg twice daily  Patient did c/o of dizziness sometimes.

## 2013-11-26 NOTE — Telephone Encounter (Signed)
Home Health nurse informed.

## 2013-12-12 ENCOUNTER — Encounter: Payer: Self-pay | Admitting: Cardiology

## 2013-12-12 ENCOUNTER — Ambulatory Visit (INDEPENDENT_AMBULATORY_CARE_PROVIDER_SITE_OTHER): Payer: Medicare Other | Admitting: Cardiology

## 2013-12-12 VITALS — BP 114/70 | HR 86 | Ht 69.0 in | Wt 240.4 lb

## 2013-12-12 DIAGNOSIS — I359 Nonrheumatic aortic valve disorder, unspecified: Secondary | ICD-10-CM

## 2013-12-12 DIAGNOSIS — I4891 Unspecified atrial fibrillation: Secondary | ICD-10-CM

## 2013-12-12 NOTE — Assessment & Plan Note (Signed)
Mild aortic regurgitation. Asymptomatic.

## 2013-12-12 NOTE — Patient Instructions (Signed)

## 2013-12-12 NOTE — Assessment & Plan Note (Signed)
Continue Lopressor 25 mg twice daily (prescription updated), and Coumadin. Keep an eye on blood pressure and heart rate at home.

## 2013-12-12 NOTE — Progress Notes (Signed)
Clinical Summary Nicholas [name] is an 80 y.o.male last seen in March of this year. He presents to the office with recorded blood pressures in the low normal to low range on medical therapy and being assessed by home PT/nursing. He's had chronic problems with weakness and fatigue, not necessarily any worse, and he tells me he tries to stay as active as possible with his ADLs. He did have a reduction in Lopressor dose down to 25 mg twice daily, and seems to be tolerating this well. I reviewed subsequent blood pressure checks finding normal ranges, and no significant tachycardia.  Today's blood pressure is normal, and he was not orthostatic.  Echocardiogram in February 2014 revealed LVEF 60-65%, mild aortic regurgitation, mildly dilated aortic root and ascending aorta, mild mitral regurgitation, moderate left atrial enlargement, moderately dilated right ventricle with normal function, moderate to severe right atrial enlargement without obvious PFO, mild tricuspid regurgitation, PASP 31 mm mercury. Exercise echocardiogram revealed limited exercise tolerance at adequate heart rate response with no diagnostic ST segment depression. Echocardiographic images revealed no ischemia in the apical 2 chamber view, other views were not adequate. Generally felt to be low risk on review.   Allergies  Allergen Reactions  . Sulfonamide Derivatives     REACTION: rash    Current Outpatient Prescriptions  Medication Sig Dispense Refill  . albuterol-ipratropium (COMBIVENT) 18-103 MCG/ACT inhaler Inhale 2 puffs into the lungs every 6 (six) hours as needed (uses rarely). UAD PRN      . allopurinol (ZYLOPRIM) 300 MG tablet Take 300 mg by mouth daily.        . Cholecalciferol (VITAMIN D3) 1000 UNITS tablet Take 1,000 Units by mouth daily.        [name] esomeprazole (NEXIUM) 40 MG capsule Take 40 mg by mouth daily.        [name] loratadine (CLARITIN) 10 MG tablet Take 10 mg by mouth daily.      . metoprolol tartrate (LOPRESSOR) 25  MG tablet Take 25 mg by mouth 2 (two) times daily.      . Multiple Vitamins-Minerals (PRESERVISION AREDS) CAPS Take 1 capsule by mouth 2 (two) times daily.       . vitamin B-12 (CYANOCOBALAMIN) 1000 MCG tablet Take 1,000 mcg by mouth daily.        [name] warfarin (COUMADIN) 5 MG tablet Take 2.5-5 mg by mouth See admin instructions. Take 5 mg on Mondays and Fridays.  Take 2.5 mg all other days.  UAD - managed by [name]       No current facility-administered medications for this visit.    Past Medical History  Diagnosis Date  . Obstructive sleep apnea   . COPD (chronic obstructive pulmonary disease)   . Osteoarthritis   . Atrial fibrillation   . Malignant neoplasm of prostate   . Essential hypertension, benign   . Esophageal reflux   . Cervical spondylosis without myelopathy   . Diverticulosis of colon (without mention of hemorrhage)     Social History Nicholas [name] reports that he quit smoking about 48 years ago. His smoking use included Cigarettes. He has a 18 pack-year smoking history. He quit smokeless tobacco use about 46 years ago. His smokeless tobacco use included Chew. Nicholas [name] reports that he does not drink alcohol.  Review of Systems He did have a fall, lost his balance on a step and injured his left arm. Stable appetite. No bleeding problems. Other systems reviewed and negative.  Physical Examination Filed Vitals:  12/12/13 1348  BP: 114/70  Pulse: 86   Filed Weights   12/12/13 1337  Weight: 240 lb 6.4 oz (109.045 kg)    Obese male in no acute distress.  HEENT: Conjunctiva and lids normal, oropharynx with moist mucosa.  Neck: Supple, no elevated JVP.  Lungs: Clear to auscultation, diminished, no wheezing.  Cardiac: Irregularly irregular, no S3 or rub.  Abdomen: Nontender, bowel sounds present,.  Extremities: No pitting edema. Distal pulses are 2+. Dressing on left forearm.   Problem List and Plan   Atrial fibrillation Continue Lopressor 25 mg twice daily  (prescription updated), and Coumadin. Keep an eye on blood pressure and heart rate at home.  Aortic valve disorders Mild aortic regurgitation. Asymptomatic.    Satira Sark, M.D., F.A.C.C.

## 2014-01-14 ENCOUNTER — Encounter (INDEPENDENT_AMBULATORY_CARE_PROVIDER_SITE_OTHER): Payer: Self-pay | Admitting: *Deleted

## 2014-01-14 ENCOUNTER — Ambulatory Visit (INDEPENDENT_AMBULATORY_CARE_PROVIDER_SITE_OTHER): Payer: Medicare Other | Admitting: Internal Medicine

## 2014-01-14 ENCOUNTER — Encounter (INDEPENDENT_AMBULATORY_CARE_PROVIDER_SITE_OTHER): Payer: Self-pay | Admitting: Internal Medicine

## 2014-01-14 VITALS — BP 118/70 | HR 68 | Temp 98.0°F | Ht 69.0 in | Wt 232.5 lb

## 2014-01-14 DIAGNOSIS — R110 Nausea: Secondary | ICD-10-CM

## 2014-01-14 DIAGNOSIS — R634 Abnormal weight loss: Secondary | ICD-10-CM

## 2014-01-14 MED ORDER — ONDANSETRON HCL 4 MG PO TABS: 4.0000 mg | ORAL_TABLET | Freq: Three times a day (TID) | ORAL | Status: DC | PRN

## 2014-01-14 NOTE — Progress Notes (Addendum)
Subjective:     Patient ID: Nicholas [name], male   DOB: 1928-07-30, 80 y.o.   MRN: [id number]  HPI Presents today with c/o of nausea. He has been nauseated for about 8 weeks. He makes himself eat. He tells me he fell about 8 weeks ago. Abrasion to his left arm.   He has lost 8 pounds since his last visit in November. In November his weight 240. Today his weight is 232.5.  He occasionally has a lot of flatus. Sometimes he has lower rt abdominal pain. Usually has a BM daily.  His appetite is okay. No dysphagia. Denies seeing blood or black stools.  He also tells me he gave a stool sample 3 months ago at Dr. Trena Platt and it was positive. His last colonoscopy was in July of 2014 for rectal bleeding and person hx of colonic polyps. This was a follow up colonoscopy. States previous colonoscopy was piece meal  .  No active bleeding noted. No colonic neoplasm noted. No colonic or rectal polyps, pan diverticulosis redundant sigmoid colon.  Small hemorrhoids. Dr. Truddie Hidden.   05/09/2012: Colonoscopy with snare polypectomy: No active bleeding noted. No colonic neoplasms noted. Internal hemorrhoids were found. Pandiverticulosis. A sessile polyp was found i the rt colon, removed via cold snare polypectomy. A sessile polyp was found in the rt rt colon, just proximal to hepatic flexure remove via ot snare. A sessile polyp was found in the proximal/mid transverse colon.  Sunday Shams assisted in removal of polyp via multiple passes of hot snare.  Polyp posterior to fold raised with saline injection and removed via hot snare. Endospot marked site. A sessile polyp was found in the proximal transverse colon just proximal to proximal  endospot removed via cold biopsy forceps. A sessile polyp was found in the distal transverse colon just proximal to distal endospot removed via hot snare. A tortuous and redundant colon was found. Retroflexed views in the rectum revealed small hemorrhoids.  Recently admitted to [facility] with chest.  Troponin's were normal.  12/19/2013 Glucose 109, BUN 10, Creatinine 0.83, NA 137, K 3.9, PT?INR 19.9 and 1.7  12/18/2013 H and H 12.1 and 35.9, MCV 102.7   06/18/2013 EGD/ED: GERD, dysphagia, chronic GERD Impression:  Small sliding hiatal hernia.  No evidence of esophageal stricture or ring.  Few small hyperplastic appearing gastric polyps. These were left alone.  Esophagus dilated by passing 56 French Maloney dilator to full insertion but no mucosal disruption noted.   12/17/2013 Chest Xray: Cardiac enlargement with possible early perihilar edema.    Review of Systems Past Medical History  Diagnosis Date  . Obstructive sleep apnea   . COPD (chronic obstructive pulmonary disease)   . Osteoarthritis   . Atrial fibrillation   . Malignant neoplasm of prostate   . Essential hypertension, benign   . Esophageal reflux   . Cervical spondylosis without myelopathy   . Diverticulosis of colon (without mention of hemorrhage)     Past Surgical History  Procedure Laterality Date  . Total knee arthroplasty      2000, 2005.   [name] Rotator cuff repair    . Esophagogastroduodenoscopy (egd) with esophageal dilation N/A 06/18/2013    Procedure: ESOPHAGOGASTRODUODENOSCOPY (EGD) WITH ESOPHAGEAL DILATION;  Surgeon: [name], MD;  Location: [facility];  Service: Endoscopy;  Laterality: N/A;  220-rescheduled to 730 Ann to notify pt    Allergies  Allergen Reactions  . Sulfonamide Derivatives     REACTION: rash    Current Outpatient Prescriptions  on File Prior to Visit  Medication Sig Dispense Refill  . albuterol-ipratropium (COMBIVENT) 18-103 MCG/ACT inhaler Inhale 2 puffs into the lungs every 6 (six) hours as needed (uses rarely). UAD PRN      . allopurinol (ZYLOPRIM) 300 MG tablet Take 300 mg by mouth daily.        . Cholecalciferol (VITAMIN D3) 1000 UNITS tablet Take 1,000 Units by mouth daily.        [name] esomeprazole (NEXIUM) 40 MG capsule Take 40 mg by mouth daily.        [name]  loratadine (CLARITIN) 10 MG tablet Take 10 mg by mouth daily.      . metoprolol tartrate (LOPRESSOR) 25 MG tablet Take 25 mg by mouth 2 (two) times daily.      . Multiple Vitamins-Minerals (PRESERVISION AREDS) CAPS Take 1 capsule by mouth 2 (two) times daily.       . vitamin B-12 (CYANOCOBALAMIN) 1000 MCG tablet Take 1,000 mcg by mouth daily.        [name] warfarin (COUMADIN) 5 MG tablet Take 2.5-5 mg by mouth See admin instructions. Take 5 mg on Mondays and Fridays.  Take 2.5 mg all other days.  UAD - managed by [name]       No current facility-administered medications on file prior to visit.        Objective:   Physical Exam  Filed Vitals:   01/14/14 1520  BP: 118/70  Pulse: 68  Temp: 98 F (36.7 C)  Height: 5\' 9"  (1.753 m)  Weight: 232 lb 8 oz (105.461 kg)  Alert and oriented. Skin warm and dry. Oral mucosa is moist.   . Sclera anicteric, conjunctivae is pink. Thyroid not enlarged. No cervical lymphadenopathy. Lungs clear. Heart regular rate and rhythm.  Abdomen is soft. Bowel sounds are positive. No hepatomegaly. No abdominal masses felt. No tenderness.  No edema to lower extremities. Stool brown and guaiac negative.       Assessment:     Nausea ? Etiology.  Weight loss. Recent EGD and colonoscopy were normal.      Plan:    Rx for Zofran, CT  Abdomen/pelvis with CM.  Further recommendations to follow. I am going to discuss with Dr. Laural Golden.

## 2014-01-14 NOTE — Patient Instructions (Signed)
CT abdomen/pelvis with CM.  

## 2014-01-15 ENCOUNTER — Ambulatory Visit (HOSPITAL_COMMUNITY)
Admission: RE | Admit: 2014-01-15 | Discharge: 2014-01-15 | Disposition: A | Discharge status: Home / Self Care | Payer: Medicare Other | Source: Ambulatory Visit | Attending: Internal Medicine | Admitting: Internal Medicine

## 2014-01-15 ENCOUNTER — Encounter (HOSPITAL_COMMUNITY): Payer: Self-pay

## 2014-01-15 DIAGNOSIS — K5730 Diverticulosis of large intestine without perforation or abscess without bleeding: Secondary | ICD-10-CM | POA: Insufficient documentation

## 2014-01-15 DIAGNOSIS — R634 Abnormal weight loss: Secondary | ICD-10-CM | POA: Insufficient documentation

## 2014-01-15 DIAGNOSIS — C61 Malignant neoplasm of prostate: Secondary | ICD-10-CM | POA: Insufficient documentation

## 2014-01-15 DIAGNOSIS — R110 Nausea: Secondary | ICD-10-CM | POA: Diagnosis not present

## 2014-01-15 MED ORDER — IOHEXOL 300 MG/ML  SOLN
100.0000 mL | Freq: Once | INTRAMUSCULAR | Status: AC | PRN
  Administered 2014-01-15: 100 mL via INTRAVENOUS

## 2014-01-18 ENCOUNTER — Telehealth (INDEPENDENT_AMBULATORY_CARE_PROVIDER_SITE_OTHER): Payer: Self-pay | Admitting: Internal Medicine

## 2014-01-18 DIAGNOSIS — R634 Abnormal weight loss: Secondary | ICD-10-CM

## 2014-01-18 DIAGNOSIS — R110 Nausea: Secondary | ICD-10-CM

## 2014-01-18 NOTE — Telephone Encounter (Signed)
I have spoken with patient concerning [place].  [place], US abdomen

## 2014-01-21 LAB — COMPREHENSIVE METABOLIC PANEL
ALBUMIN: 3.6 g/dL (ref 3.5–5.2)
ALK PHOS: 54 U/L (ref 39–117)
ALT: <8 U/L (ref 0–53)
AST: 17 U/L (ref 0–37)
BUN: 12 mg/dL (ref 6–23)
CHLORIDE: 104 meq/L (ref 96–112)
CO2: 28 mEq/L (ref 19–32)
CREATININE: 0.93 mg/dL (ref 0.50–1.35)
Calcium: 8.7 mg/dL (ref 8.4–10.5)
Glucose, Bld: 138 mg/dL — ABNORMAL HIGH (ref 70–99)
POTASSIUM: 4.6 meq/L (ref 3.5–5.3)
Sodium: 138 mEq/L (ref 135–145)
Total Bilirubin: 0.5 mg/dL (ref 0.2–1.2)
Total Protein: 5.6 g/dL — ABNORMAL LOW (ref 6.0–8.3)

## 2014-01-24 ENCOUNTER — Ambulatory Visit (HOSPITAL_COMMUNITY)
Admission: RE | Admit: 2014-01-24 | Discharge: 2014-01-24 | Disposition: A | Discharge status: Home / Self Care | Payer: Medicare Other | Source: Ambulatory Visit | Attending: Internal Medicine | Admitting: Internal Medicine

## 2014-01-24 DIAGNOSIS — R110 Nausea: Secondary | ICD-10-CM | POA: Insufficient documentation

## 2014-01-24 DIAGNOSIS — R634 Abnormal weight loss: Secondary | ICD-10-CM | POA: Insufficient documentation

## 2014-01-29 ENCOUNTER — Telehealth (INDEPENDENT_AMBULATORY_CARE_PROVIDER_SITE_OTHER): Payer: Self-pay | Admitting: *Deleted

## 2014-01-29 ENCOUNTER — Telehealth (INDEPENDENT_AMBULATORY_CARE_PROVIDER_SITE_OTHER): Payer: Self-pay | Admitting: Internal Medicine

## 2014-01-29 NOTE — Telephone Encounter (Signed)
He came to office. He has gained 2 pounds.  Nexium 20mg  Weight in 2 weeks.

## 2014-01-29 NOTE — Telephone Encounter (Signed)
This has been addressed.

## 2014-01-29 NOTE — Telephone Encounter (Signed)
Patient states that he was called by terri this morning. She ask about his weight at home 224 lbs.  She ask that he come to out office and weigh , he weighed 234.7lbs  An appointment was given for 1 month per Terri noted on his lab result.  Patient states that he has seen [name] for the same thing that he came to see her for. He put him on Nexium 40 mg daily. Since this time [name] called and told patient that he needed to take 80 mg daily. Patient does not nor has he done so. He would like to know if he could take 20 mg of Nexium , to see if this would help. He is concerned about his bones.

## 2014-01-29 NOTE — Telephone Encounter (Signed)
error 

## 2014-01-30 ENCOUNTER — Other Ambulatory Visit (INDEPENDENT_AMBULATORY_CARE_PROVIDER_SITE_OTHER): Payer: Self-pay | Admitting: Internal Medicine

## 2014-01-30 DIAGNOSIS — K219 Gastro-esophageal reflux disease without esophagitis: Secondary | ICD-10-CM

## 2014-01-30 MED ORDER — ESOMEPRAZOLE MAGNESIUM 20 MG PO CPDR: 20.0000 mg | DELAYED_RELEASE_CAPSULE | Freq: Every day | ORAL | Status: DC

## 2014-01-30 NOTE — Telephone Encounter (Signed)
Patient request we drop in Nexium to 20mg  so his insurance will pay. Worried about his bones. I told him we would try for a month to see how he does. He has an OV in 2 weeks.

## 2014-02-11 ENCOUNTER — Other Ambulatory Visit (INDEPENDENT_AMBULATORY_CARE_PROVIDER_SITE_OTHER): Payer: Self-pay | Admitting: Internal Medicine

## 2014-02-11 DIAGNOSIS — K219 Gastro-esophageal reflux disease without esophagitis: Secondary | ICD-10-CM

## 2014-02-11 MED ORDER — ESOMEPRAZOLE MAGNESIUM 20 MG PO CPDR: 20.0000 mg | DELAYED_RELEASE_CAPSULE | Freq: Two times a day (BID) | ORAL | Status: DC

## 2014-02-11 NOTE — Telephone Encounter (Signed)
Weight 234.2. No weight loss.

## 2014-02-28 ENCOUNTER — Ambulatory Visit (INDEPENDENT_AMBULATORY_CARE_PROVIDER_SITE_OTHER): Payer: Medicare Other | Admitting: Internal Medicine

## 2014-02-28 ENCOUNTER — Encounter (INDEPENDENT_AMBULATORY_CARE_PROVIDER_SITE_OTHER): Payer: Self-pay | Admitting: Internal Medicine

## 2014-02-28 VITALS — BP 104/54 | HR 64 | Temp 97.7°F | Ht 69.0 in | Wt 237.0 lb

## 2014-02-28 DIAGNOSIS — R110 Nausea: Secondary | ICD-10-CM

## 2014-02-28 LAB — CBC WITH DIFFERENTIAL/PLATELET
BASOS ABS: 0 10*3/uL (ref 0.0–0.1)
BASOS PCT: 0 % (ref 0–1)
EOS ABS: 0.1 10*3/uL (ref 0.0–0.7)
EOS PCT: 3 % (ref 0–5)
HEMATOCRIT: 38.5 % — AB (ref 39.0–52.0)
HEMOGLOBIN: 13.2 g/dL (ref 13.0–17.0)
Lymphocytes Relative: 29 % (ref 12–46)
Lymphs Abs: 1.4 10*3/uL (ref 0.7–4.0)
MCH: 33.2 pg (ref 26.0–34.0)
MCHC: 34.3 g/dL (ref 30.0–36.0)
MCV: 96.7 fL (ref 78.0–100.0)
MONOS PCT: 7 % (ref 3–12)
Monocytes Absolute: 0.3 10*3/uL (ref 0.1–1.0)
Neutro Abs: 3 10*3/uL (ref 1.7–7.7)
Neutrophils Relative %: 61 % (ref 43–77)
Platelets: 178 10*3/uL (ref 150–400)
RBC: 3.98 MIL/uL — ABNORMAL LOW (ref 4.22–5.81)
RDW: 15.7 % — AB (ref 11.5–15.5)
WBC: 4.9 10*3/uL (ref 4.0–10.5)

## 2014-02-28 LAB — HEPATIC FUNCTION PANEL
ALBUMIN: 3.6 g/dL (ref 3.5–5.2)
AST: 18 U/L (ref 0–37)
Alkaline Phosphatase: 60 U/L (ref 39–117)
Bilirubin, Direct: 0.1 mg/dL (ref 0.0–0.3)
Indirect Bilirubin: 0.5 mg/dL (ref 0.2–1.2)
TOTAL PROTEIN: 5.8 g/dL — AB (ref 6.0–8.3)
Total Bilirubin: 0.6 mg/dL (ref 0.2–1.2)

## 2014-02-28 NOTE — Patient Instructions (Signed)
Continue present medications. CBC and Hepatic function today

## 2014-02-28 NOTE — Progress Notes (Signed)
Subjective:    Patient ID: Nicholas [name], male    DOB: [birth year], 80 y.o.   MRN: [id number]  HPIHere today for f/u of his nausea. Seen in July for nausea which had been ongoing for 8 weeks. Nausea started after falling and had an laceration to his left  arm.  He has gained about 5 pounds since his last visit in July. He tells me he feels better. He does have nausea sometimes in the morning. He says after he eats, the nausea get better. Weight today is 237.  Appetite is good. No abdominal pain.  He usually has a BM daily. No melena or BRRB. 01/24/2014 US abdomen:  IMPRESSION:  Mild to moderate gallbladder sludge versus small non shadowing  stones. No additional sonographic evidence to suggest acute  cholecystitis.  Mild diffuse hepatic steatosis without focal mass.   His last colonoscopy was in July of 2014 for rectal bleeding and person hx of colonic polyps. This was a follow up colonoscopy. States previous colonoscopy was piece meal . No active bleeding noted. No colonic neoplasm noted. No colonic or rectal polyps, pan diverticulosis redundant sigmoid colon. Small hemorrhoids. Dr. Truddie Hidden.  05/09/2012: Colonoscopy with snare polypectomy: No active bleeding noted. No colonic neoplasms noted. Internal hemorrhoids were found. Pandiverticulosis. A sessile polyp was found i the rt colon, removed via cold snare polypectomy. A sessile polyp was found in the rt rt colon, just proximal to hepatic flexure remove via ot snare. A sessile polyp was found in the proximal/mid transverse colon. Nicholas [name] assisted in removal of polyp via multiple passes of hot snare. Polyp posterior to fold raised with saline injection and removed via hot snare. Endospot marked site. A sessile polyp was found in the proximal transverse colon just proximal to proximal endospot removed via cold biopsy forceps. A sessile polyp was found in the distal transverse colon just proximal to distal endospot removed via hot snare. A  tortuous and redundant colon was found. Retroflexed views in the rectum revealed small hemorrhoids.  Hx of prostate cancer and underwent radiation.   12/19/2013  Glucose 109, BUN 10, Creatinine 0.83, NA 137, K 3.9,  PT/INR 19.9 and 1.7  12/18/2013 H and H 12.1 and 35.9, MCV 102.7  06/18/2013 EGD/ED: GERD, dysphagia, chronic GERD  Impression:  Small sliding hiatal hernia.  No evidence of esophageal stricture or ring.  Few small hyperplastic appearing gastric polyps. These were left alone.  Esophagus dilated by passing 56 French Maloney dilator to full insertion but no mucosal disruption noted.     Review of Systems Past Medical History  Diagnosis Date  . Obstructive sleep apnea   . COPD (chronic obstructive pulmonary disease)   . Osteoarthritis   . Atrial fibrillation   . Malignant neoplasm of prostate   . Essential hypertension, benign   . Esophageal reflux   . Cervical spondylosis without myelopathy   . Diverticulosis of colon (without mention of hemorrhage)     Past Surgical History  Procedure Laterality Date  . Total knee arthroplasty      2000, 2005.   [name] Rotator cuff repair    . Esophagogastroduodenoscopy (egd) with esophageal dilation N/A 06/18/2013    Procedure: ESOPHAGOGASTRODUODENOSCOPY (EGD) WITH ESOPHAGEAL DILATION;  Surgeon: [name], MD;  Location: [facility];  Service: Endoscopy;  Laterality: N/A;  220-rescheduled to 730 Ann to notify pt    Allergies  Allergen Reactions  . Sulfonamide Derivatives     REACTION: rash    Current Outpatient  Prescriptions on File Prior to Visit  Medication Sig Dispense Refill  . albuterol-ipratropium (COMBIVENT) 18-103 MCG/ACT inhaler Inhale 2 puffs into the lungs every 6 (six) hours as needed (uses rarely). UAD PRN      . allopurinol (ZYLOPRIM) 300 MG tablet Take 300 mg by mouth daily.        . Cholecalciferol (VITAMIN D3) 1000 UNITS tablet Take 1,000 Units by mouth daily.        [name] loratadine (CLARITIN) 10 MG tablet  Take 10 mg by mouth daily.      . metoprolol tartrate (LOPRESSOR) 25 MG tablet Take 25 mg by mouth 2 (two) times daily.      . Multiple Vitamins-Minerals (PRESERVISION AREDS) CAPS Take 1 capsule by mouth 2 (two) times daily.       . ondansetron (ZOFRAN) 4 MG tablet Take 1 tablet (4 mg total) by mouth every 8 (eight) hours as needed for nausea or vomiting.  30 tablet  1  . vitamin B-12 (CYANOCOBALAMIN) 1000 MCG tablet Take 1,000 mcg by mouth daily.        [name] warfarin (COUMADIN) 5 MG tablet Take 2.5-5 mg by mouth See admin instructions. Take 5 mg on Mondays and Fridays.  Take 2.5 mg all other days.  UAD - managed by [name]       No current facility-administered medications on file prior to visit.        Objective:   Physical Exam  Filed Vitals:   02/28/14 1048  BP: 104/54  Pulse: 64  Temp: 97.7 F (36.5 C)  Height: 5\' 9"  (1.753 m)  Weight: 237 lb (107.502 kg)  Alert and oriented. Skin warm and dry. Oral mucosa is moist.   . Sclera anicteric, conjunctivae is pink. Thyroid not enlarged. No cervical lymphadenopathy. Lungs clear. Heart regular rate and rhythm.  Abdomen is soft. Bowel sounds are positive. No hepatomegaly. No abdominal masses felt. No tenderness.  No edema to lower extremities. Rectal exam negative for blood (no stool).         Assessment & Plan:  Nausea which is better now.  Nausea occurred after sustaining a laceration to left arm.  CBC and hepatic function today.  OV in 3 months with Dr. Laural Golden.

## 2014-03-05 ENCOUNTER — Ambulatory Visit (INDEPENDENT_AMBULATORY_CARE_PROVIDER_SITE_OTHER): Payer: Medicare Other | Admitting: Urology

## 2014-03-05 DIAGNOSIS — Z8546 Personal history of malignant neoplasm of prostate: Secondary | ICD-10-CM

## 2014-03-05 DIAGNOSIS — N529 Male erectile dysfunction, unspecified: Secondary | ICD-10-CM

## 2014-03-20 ENCOUNTER — Ambulatory Visit: Payer: Medicare Other | Admitting: Cardiology

## 2014-06-03 ENCOUNTER — Encounter (INDEPENDENT_AMBULATORY_CARE_PROVIDER_SITE_OTHER): Payer: Self-pay | Admitting: Internal Medicine

## 2014-06-03 ENCOUNTER — Encounter (INDEPENDENT_AMBULATORY_CARE_PROVIDER_SITE_OTHER): Payer: Self-pay | Admitting: *Deleted

## 2014-06-03 ENCOUNTER — Ambulatory Visit (INDEPENDENT_AMBULATORY_CARE_PROVIDER_SITE_OTHER): Payer: Medicare Other | Admitting: Internal Medicine

## 2014-06-03 VITALS — BP 108/66 | HR 64 | Temp 97.7°F | Resp 18 | Ht 69.0 in | Wt 236.2 lb

## 2014-06-03 DIAGNOSIS — R140 Abdominal distension (gaseous): Secondary | ICD-10-CM

## 2014-06-03 DIAGNOSIS — R110 Nausea: Secondary | ICD-10-CM

## 2014-06-03 MED ORDER — SIMETHICONE 125 MG PO CHEW: 125.0000 mg | CHEWABLE_TABLET | Freq: Four times a day (QID) | ORAL | Status: DC | PRN

## 2014-06-03 NOTE — Progress Notes (Signed)
Presenting complaint;  Nausea and bloating.  Subjective:  Patient is 80 year old Caucasian male who presents for reevaluation of nausea. His nausea started sometime in May this year. He was seen in her office in July and had upper abdominal ultrasound which reveals lodged there is question of small stones. No wall thickening was identified and bile duct was 4.3 mm. Return for follow visit 3 months ago and was having less nausea. LFTs were normal. He states nausea has gotten worse. He does not have nausea when he wakes up. Nausea occurs daily or every other day after meals and associated with bloating and burping and he also has right infrascapular pain. He feels like vomiting but never has been able to do so. Nausea subsides after 1-2 hours. He also complains of intermittent fleeting right lower quadrant abdominal pain which she describes as gas pain. He denies melena or rectal bleeding. He has occasional constipation. He has noted blood on wiping every now and then. He knows he has hemorrhoids.  He has dysphagia sometimes on bread. Last EGD with ED was in December 2014. He underwent colonoscopy in November 2013 with removal of 4 adenomas by Dr. Truddie Hidden in an colonoscopy in July 2014 was negative for polyps. He had pancolonic diverticulosis. He would like for me to do Hemoccult today.     Current Medications: Outpatient Encounter Prescriptions as of 06/03/2014  Medication Sig  . albuterol-ipratropium (COMBIVENT) 18-103 MCG/ACT inhaler Inhale 2 puffs into the lungs every 6 (six) hours as needed (uses rarely). UAD PRN  . allopurinol (ZYLOPRIM) 300 MG tablet Take 300 mg by mouth daily.    . Cholecalciferol (VITAMIN D3) 1000 UNITS tablet Take 1,000 Units by mouth daily.    [name] loratadine (CLARITIN) 10 MG tablet Take 10 mg by mouth daily.  . metoprolol tartrate (LOPRESSOR) 25 MG tablet Take 25 mg by mouth 2 (two) times daily.  . Multiple Vitamins-Minerals (PRESERVISION AREDS) CAPS Take 1 capsule by  mouth 2 (two) times daily.   . ondansetron (ZOFRAN) 4 MG tablet Take 1 tablet (4 mg total) by mouth every 8 (eight) hours as needed for nausea or vomiting.  . vitamin B-12 (CYANOCOBALAMIN) 1000 MCG tablet Take 1,000 mcg by mouth daily.    [name] warfarin (COUMADIN) 5 MG tablet Take 2.5-5 mg by mouth See admin instructions. Take 5 mg on Mondays . Take 2.5 mg all other days.  UAD - managed by [name]  . [DISCONTINUED] esomeprazole (NEXIUM) 20 MG capsule Take 20 mg by mouth daily.     Objective: Blood pressure 108/66, pulse 64, temperature 97.7 F (36.5 C), temperature source Oral, resp. rate 18, height 5\' 9"  (1.753 m), weight 236 lb 3.2 oz (107.14 kg).  Patient is alert and in no acute distress.  Conjunctiva is pink. Sclera is nonicteric Oropharyngeal mucosa is normal. No neck masses or thyromegaly noted. Cardiac exam with regular rhythm normal S1 and S2. No murmur or gallop noted. Lungs are clear to auscultation. Abdomen is full but soft and nontender without organomegaly or masses. No LE edema or clubbing noted.  Labs/studies Results: Ultrasound on 01/24/2014 revealed echogenic material in the gallbladder and small echogenic foci without shadowing likely sludge versus small stones. Bile duct measured 4.3 mm. Liver with diffuse hepatic steatosis.  LFTs normal and 02/28/2014   H&H was 13.2 and 38.5 on 02/28/2014. Assessment:  #1. Patient's symptom complex consisting of postprandial nausea and heaving bloating and right infrascapular pain are suspicious for papillary tract disease. Ultrasound 6 months ago suggested  sludge and possibly small stones. #2. History of colonic adenomas. Stool is guaiac negative today.   Plan:  Upper abdominal ultrasound. Simethicone 125 mg 4 times a day when necessary. Ondansetron 4 mg by mouth 3 times a day when necessary. Next surveillance colonoscopy would be in July 2017.

## 2014-06-03 NOTE — Patient Instructions (Addendum)
Physician will call with results of ultrasound and completed. Next colonoscopy in July 2017

## 2014-06-04 ENCOUNTER — Encounter: Payer: Self-pay | Admitting: Cardiology

## 2014-06-04 ENCOUNTER — Ambulatory Visit (INDEPENDENT_AMBULATORY_CARE_PROVIDER_SITE_OTHER): Payer: Medicare Other | Admitting: Cardiology

## 2014-06-04 VITALS — BP 110/70 | HR 80 | Ht 69.0 in | Wt 234.0 lb

## 2014-06-04 DIAGNOSIS — I359 Nonrheumatic aortic valve disorder, unspecified: Secondary | ICD-10-CM

## 2014-06-04 DIAGNOSIS — I482 Chronic atrial fibrillation, unspecified: Secondary | ICD-10-CM

## 2014-06-04 MED ORDER — METOPROLOL TARTRATE 25 MG PO TABS
25.0000 mg | ORAL_TABLET | Freq: Two times a day (BID) | ORAL | Status: DC
Start: 2014-06-04 — End: 2015-05-13

## 2014-06-04 NOTE — Assessment & Plan Note (Signed)
History of mild aortic regurgitation with mildly dilated aortic root.

## 2014-06-04 NOTE — Assessment & Plan Note (Signed)
Symptomatically stable on current medical regimen. Continue Lopressor 25 mg twice daily and Coumadin.

## 2014-06-04 NOTE — Progress Notes (Signed)
Reason for visit: Atrial fibrillation  Clinical Summary Nicholas [name] is an 80 y.o.male last seen in June. He presents for a routine visit. Recent follow-up with Dr. Laural Golden noted. He is undergoing workup for potential gallbladder disease. He reports no palpitations. I reviewed his home blood pressure and heart rate checks, no significant bradycardia or tachycardia. He continues to tolerate Lopressor at 25 mg twice daily. No bleeding issues with Coumadin.  Echocardiogram in February 2014 revealed LVEF 60-65%, mild aortic regurgitation, mildly dilated aortic root and ascending aorta, mild mitral regurgitation, moderate left atrial enlargement, moderately dilated right ventricle with normal function, moderate to severe right atrial enlargement without obvious PFO, mild tricuspid regurgitation, PASP 31 mm mercury. Exercise echocardiogram revealed limited exercise tolerance at adequate heart rate response with no diagnostic ST segment depression. Echocardiographic images revealed no ischemia in the apical 2 chamber view, other views were not adequate. Generally felt to be low risk on review.  Allergies  Allergen Reactions  . Sulfonamide Derivatives     REACTION: rash    Current Outpatient Prescriptions  Medication Sig Dispense Refill  . albuterol-ipratropium (COMBIVENT) 18-103 MCG/ACT inhaler Inhale 2 puffs into the lungs every 6 (six) hours as needed (uses rarely). UAD PRN    . allopurinol (ZYLOPRIM) 300 MG tablet Take 300 mg by mouth daily.      . Cholecalciferol (VITAMIN D3) 1000 UNITS tablet Take 1,000 Units by mouth daily.      . clonazePAM (KLONOPIN) 0.5 MG tablet Take 0.5 mg by mouth 2 (two) times daily as needed for anxiety.    [name] loratadine (CLARITIN) 10 MG tablet Take 10 mg by mouth daily.    . metoprolol tartrate (LOPRESSOR) 25 MG tablet Take 1 tablet (25 mg total) by mouth 2 (two) times daily. 180 tablet 3  . Multiple Vitamins-Minerals (PRESERVISION AREDS) CAPS Take 1 capsule by mouth 2  (two) times daily.     . ondansetron (ZOFRAN) 4 MG tablet Take 1 tablet (4 mg total) by mouth every 8 (eight) hours as needed for nausea or vomiting. 30 tablet 1  . simethicone (MYLICON) 659 MG chewable tablet Chew 125 mg by mouth every 6 (six) hours as needed for flatulence.    . vitamin B-12 (CYANOCOBALAMIN) 1000 MCG tablet Take 1,000 mcg by mouth daily.      [name] warfarin (COUMADIN) 5 MG tablet Take 2.5-5 mg by mouth See admin instructions. Take 5 mg on Mondays . Take 2.5 mg all other days.  UAD - managed by [name]     No current facility-administered medications for this visit.    Past Medical History  Diagnosis Date  . Obstructive sleep apnea   . COPD (chronic obstructive pulmonary disease)   . Osteoarthritis   . Atrial fibrillation   . Malignant neoplasm of prostate   . Essential hypertension, benign   . Esophageal reflux   . Cervical spondylosis without myelopathy   . Diverticulosis of colon (without mention of hemorrhage)     Social History Mr. Smestad reports that he quit smoking about 48 years ago. His smoking use included Cigarettes. He has a 18 pack-year smoking history. He quit smokeless tobacco use about 46 years ago. His smokeless tobacco use included Chew. Mr. Caudillo reports that he does not drink alcohol.  Review of Systems Complete review of systems negative except as otherwise outlined in the clinical summary and also the following. Intermittent abdominal bloating and postprandial nausea. No fevers or chills.  Physical Examination Filed Vitals:  06/04/14 1437  BP: 110/70  Pulse: 80   Filed Weights   06/04/14 1437  Weight: 234 lb (106.142 kg)    Obese male in no acute distress.  HEENT: Conjunctiva and lids normal, oropharynx with moist mucosa.  Neck: Supple, no elevated JVP.  Lungs: Clear to auscultation, diminished, no wheezing.  Cardiac: Irregularly irregular, no S3 or rub.  Abdomen: Nontender, bowel sounds present,.  Extremities: No pitting  edema. Distal pulses are 2+. Dressing on left forearm.   Problem List and Plan   Atrial fibrillation Symptomatically stable on current medical regimen. Continue Lopressor 25 mg twice daily and Coumadin.  Aortic valve disorder History of mild aortic regurgitation with mildly dilated aortic root.    Satira Sark, M.D., F.A.C.C.

## 2014-06-04 NOTE — Patient Instructions (Signed)

## 2014-06-12 ENCOUNTER — Ambulatory Visit (HOSPITAL_COMMUNITY)
Admission: RE | Admit: 2014-06-12 | Discharge: 2014-06-12 | Disposition: A | Discharge status: Home / Self Care | Payer: Medicare Other | Source: Ambulatory Visit | Attending: Internal Medicine | Admitting: Internal Medicine

## 2014-06-12 DIAGNOSIS — K8020 Calculus of gallbladder without cholecystitis without obstruction: Secondary | ICD-10-CM | POA: Insufficient documentation

## 2014-06-12 DIAGNOSIS — R112 Nausea with vomiting, unspecified: Secondary | ICD-10-CM | POA: Diagnosis present

## 2014-06-12 DIAGNOSIS — R110 Nausea: Secondary | ICD-10-CM

## 2014-06-14 ENCOUNTER — Encounter (INDEPENDENT_AMBULATORY_CARE_PROVIDER_SITE_OTHER): Payer: Self-pay

## 2014-06-27 ENCOUNTER — Encounter (HOSPITAL_COMMUNITY)
Admission: RE | Admit: 2014-06-27 | Discharge: 2014-06-27 | Disposition: A | Discharge status: Home / Self Care | Payer: Medicare Other | Source: Ambulatory Visit | Attending: General Surgery | Admitting: General Surgery

## 2014-06-27 ENCOUNTER — Encounter (HOSPITAL_COMMUNITY): Payer: Self-pay

## 2014-06-27 DIAGNOSIS — I10 Essential (primary) hypertension: Secondary | ICD-10-CM | POA: Diagnosis not present

## 2014-06-27 DIAGNOSIS — K811 Chronic cholecystitis: Secondary | ICD-10-CM | POA: Insufficient documentation

## 2014-06-27 DIAGNOSIS — Z01812 Encounter for preprocedural laboratory examination: Secondary | ICD-10-CM | POA: Insufficient documentation

## 2014-06-27 DIAGNOSIS — I4891 Unspecified atrial fibrillation: Secondary | ICD-10-CM | POA: Insufficient documentation

## 2014-06-27 DIAGNOSIS — Z7901 Long term (current) use of anticoagulants: Secondary | ICD-10-CM | POA: Diagnosis not present

## 2014-06-27 LAB — CBC WITH DIFFERENTIAL/PLATELET
Basophils Absolute: 0 10*3/uL (ref 0.0–0.1)
Basophils Relative: 1 % (ref 0–1)
Eosinophils Absolute: 0.1 10*3/uL (ref 0.0–0.7)
Eosinophils Relative: 1 % (ref 0–5)
HEMATOCRIT: 40.9 % (ref 39.0–52.0)
HEMOGLOBIN: 13.6 g/dL (ref 13.0–17.0)
LYMPHS ABS: 1.7 10*3/uL (ref 0.7–4.0)
Lymphocytes Relative: 26 % (ref 12–46)
MCH: 33.9 pg (ref 26.0–34.0)
MCHC: 33.3 g/dL (ref 30.0–36.0)
MCV: 102 fL — ABNORMAL HIGH (ref 78.0–100.0)
MONO ABS: 0.4 10*3/uL (ref 0.1–1.0)
MONOS PCT: 7 % (ref 3–12)
NEUTROS ABS: 4.2 10*3/uL (ref 1.7–7.7)
Neutrophils Relative %: 65 % (ref 43–77)
Platelets: 194 10*3/uL (ref 150–400)
RBC: 4.01 MIL/uL — AB (ref 4.22–5.81)
RDW: 14.6 % (ref 11.5–15.5)
WBC: 6.4 10*3/uL (ref 4.0–10.5)

## 2014-06-27 LAB — BASIC METABOLIC PANEL
Anion gap: 7 (ref 5–15)
BUN: 14 mg/dL (ref 6–23)
CALCIUM: 9.3 mg/dL (ref 8.4–10.5)
CO2: 28 mmol/L (ref 19–32)
Chloride: 104 mEq/L (ref 96–112)
Creatinine, Ser: 0.82 mg/dL (ref 0.50–1.35)
GFR calc Af Amer: >90 mL/min (ref >90)
GFR, EST NON AFRICAN AMERICAN: 80 mL/min — AB (ref >90)
Glucose, Bld: 88 mg/dL (ref 70–99)
Potassium: 4.4 mmol/L (ref 3.5–5.1)
Sodium: 139 mmol/L (ref 135–145)

## 2014-06-27 LAB — HEPATIC FUNCTION PANEL
ALBUMIN: 4 g/dL (ref 3.5–5.2)
ALT: 11 U/L (ref 0–53)
AST: 26 U/L (ref 0–37)
Alkaline Phosphatase: 78 U/L (ref 39–117)
Bilirubin, Direct: 0.1 mg/dL (ref 0.0–0.3)
Indirect Bilirubin: 0.5 mg/dL (ref 0.3–0.9)
TOTAL PROTEIN: 6.3 g/dL (ref 6.0–8.3)
Total Bilirubin: 0.6 mg/dL (ref 0.3–1.2)

## 2014-06-27 LAB — PROTIME-INR
INR: 2.05 — ABNORMAL HIGH (ref 0.00–1.49)
Prothrombin Time: 23.3 seconds — ABNORMAL HIGH (ref 11.6–15.2)

## 2014-06-27 MED ORDER — CHLORHEXIDINE GLUCONATE 4 % EX LIQD: 1.0000 "application " | Freq: Once | CUTANEOUS | Status: DC

## 2014-06-27 NOTE — Patient Instructions (Addendum)
MOMIN MISKO  06/27/2014   Your procedure is scheduled on:  Wednesday, Liliane Channel  Report to [facility] at 10:00AM.  Call this number if you have problems the morning of surgery: [phone]   Remember:   Do not eat food or drink liquids after midnight.   Take these medicines the morning of surgery with A SIP OF WATER: Use inhaler (Combivent) before coming to hospital. Take Metoprolol, Klonopin,Claritin, Zofran   Do not wear jewelry, make-up or nail polish.  Do not wear lotions, powders, or perfumes. You may wear deodorant.  Do not shave 48 hours prior to surgery. Men may shave face and neck.  Do not bring valuables to the hospital.  [facility] is not responsible for any belongings or valuables.               Contacts, dentures or bridgework may not be worn into surgery.  Leave suitcase in the car. After surgery it may be brought to your room.  For patients admitted to the hospital, discharge time is determined by your  treatment team.               Patients discharged the day of surgery will not be allowed to drive  home.  Name and phone number of your driver: FAMILY  Special Instructions: Shower using CHG 2 nights before surgery and the night before surgery.  If you shower the day of surgery use CHG.  Use special wash - you have one bottle of CHG for all showers.  You should use approximately 1/3 of the bottle for each shower.   Please read over the following fact sheets that you were given: Pain Booklet, Coughing and Deep Breathing, MRSA Information, Surgical Site Infection Prevention, Anesthesia Post-op Instructions and Care and Recovery After Surgery   Laparoscopic Cholecystectomy Laparoscopic cholecystectomy is surgery to remove the gallbladder. The gallbladder is located in the upper right part of the abdomen, behind the liver. It is a storage sac for bile produced in the liver. Bile aids in the digestion and absorption of fats. Cholecystectomy is often done for inflammation of  the gallbladder (cholecystitis). This condition is usually caused by a buildup of gallstones (cholelithiasis) in your gallbladder. Gallstones can block the flow of bile, resulting in inflammation and pain. In severe cases, emergency surgery may be required. When emergency surgery is not required, you will have time to prepare for the procedure. Laparoscopic surgery is an alternative to open surgery. Laparoscopic surgery has a shorter recovery time. Your common bile duct may also need to be examined during the procedure. If stones are found in the common bile duct, they may be removed. LET [facility] CARE PROVIDER KNOW ABOUT:  Any allergies you have.  All medicines you are taking, including vitamins, herbs, eye drops, creams, and over-the-counter medicines.  Previous problems you or members of your family have had with the use of anesthetics.  Any blood disorders you have.  Previous surgeries you have had.  Medical conditions you have. RISKS AND COMPLICATIONS Generally, this is a safe procedure. However, as with any procedure, complications can occur. Possible complications include:  Infection.  Damage to the common bile duct, nerves, arteries, veins, or other internal organs such as the stomach, liver, or intestines.  Bleeding.  A stone may remain in the common bile duct.  A bile leak from the cyst duct that is clipped when your gallbladder is removed.  The need to convert to open surgery, which requires a larger incision in  the abdomen. This may be necessary if your surgeon thinks it is not safe to continue with a laparoscopic procedure. BEFORE THE PROCEDURE  Ask your health care provider about changing or stopping any regular medicines. You will need to stop taking aspirin or blood thinners at least 5 days prior to surgery.  Do not eat or drink anything after midnight the night before surgery.  Let your health care provider know if you develop a cold or other infectious problem  before surgery. PROCEDURE   You will be given medicine to make you sleep through the procedure (general anesthetic). A breathing tube will be placed in your mouth.  When you are asleep, your surgeon will make several small cuts (incisions) in your abdomen.  A thin, lighted tube with a tiny camera on the end (laparoscope) is inserted through one of the small incisions. The camera on the laparoscope sends a picture to a TV screen in the operating room. This gives the surgeon a good view inside your abdomen.  A gas will be pumped into your abdomen. This expands your abdomen so that the surgeon has more room to perform the surgery.  Other tools needed for the procedure are inserted through the other incisions. The gallbladder is removed through one of the incisions.  After the removal of your gallbladder, the incisions will be closed with stitches, staples, or skin glue. AFTER THE PROCEDURE  You will be taken to a recovery area where your progress will be checked often.  You may be allowed to go home the same day if your pain is controlled and you can tolerate liquids.  PATIENT INSTRUCTIONS POST-ANESTHESIA  IMMEDIATELY FOLLOWING SURGERY:  Do not drive or operate machinery for the first twenty four hours after surgery.  Do not make any important decisions for twenty four hours after surgery or while taking narcotic pain medications or sedatives.  If you develop intractable nausea and vomiting or a severe headache please notify your doctor immediately.  FOLLOW-UP:  Please make an appointment with your surgeon as instructed. You do not need to follow up with anesthesia unless specifically instructed to do so.  WOUND CARE INSTRUCTIONS (if applicable):  Keep a dry clean dressing on the anesthesia/puncture wound site if there is drainage.  Once the wound has quit draining you may leave it open to air.  Generally you should leave the bandage intact for twenty four hours unless there is drainage.  If  the epidural site drains for more than 36-48 hours please call the anesthesia department.  QUESTIONS?:  Please feel free to call your physician or the hospital operator if you have any questions, and they will be happy to assist you.

## 2014-06-27 NOTE — H&P (Signed)
  NTS SOAP Note  Vital Signs:  Vitals as of: 74/94/4967: Systolic 591: Diastolic 87: Heart Rate 68: Temp 98.87F: Height 83ft 9in: Weight 242Lbs 0 Ounces: BMI 35.74  BMI : 35.74 kg/m2  Subjective: This 80 year old male presents for of right upper quadrant abdominal pain.  Has been present for several months now.  Made worse with eating.  Has right upper quadrant abdominal pain with radiation to right flank,  nausea,  and bloating.  No fever,  chills,  jaundice.  Has been followed by Dr. Laural Golden.  Workup shows gallbladder sludge.   Review of Symptoms:  Constitutional:fatigue Head:unremarkable Eyes:unremarkable   sinus problems Cardiovascular:  unremarkable Respiratory:dyspnea Gastrointestinabdominal pain, nausea, dyspepsia Genitourinary:unremarkable   joint pain Skin:unremarkable on coumadin Allergic/Immunologic:unremarkable   Past Medical History:  Reviewed  Past Medical History  Surgical History: knee,  shoulder,  prostate Medical Problems: Prostate cancer,  HTN,  atrial fibrillation,  gout,  chronic anticoagulation Allergies: sulfur Medications: allopurinol,  clonazepam,  loratidine,  metoprolol,  ondansteron,  coumadin,  mylecon   Social History:Reviewed  Social History  Preferred Language: English Race:  White Ethnicity: Not Hispanic / Latino Age: 25 year Marital Status:  M   Smoking Status: Never smoker reviewed on 06/27/2014 Functional Status reviewed on 06/27/2014 ------------------------------------------------ Bathing: Normal Cooking: Normal Dressing: Normal Driving: Normal Eating: Normal Managing Meds: Normal Oral Care: Normal Shopping: Normal Toileting: Normal Transferring: Normal Walking: Normal Cognitive Status reviewed on 06/27/2014 ------------------------------------------------ Attention: Normal Decision Making: Normal Language: Normal Memory: Normal Motor: Normal Perception: Normal Problem Solving: Normal Visual and  Spatial: Normal   Family History:Reviewed  Family Health History Mother, Deceased; Healthy;  Father, Deceased; Healthy;     Objective Information: General:Well appearing, well nourished in no distress. no scleral icterus Irregularly irregular rhythym. Lungs:  CTA bilaterally, no wheezes, rhonchi, rales.  Breathing unlabored. Abdomen:Soft, discomfort in right upper quadrant to deep palpation, ND, normal bowel sounds, no HSM, no masses.  No peritoneal signs.  Assessment:Biliary colic,  cholelithiasis  Diagnoses: 574.20  K80.20 Gallstone (Calculus of gallbladder without cholecystitis without obstruction)  Procedures: 63846 - OFFICE OUTPATIENT NEW 30 MINUTES    Plan:  Scheduled for laparoscopic cholecystectomy on 07/03/14.  Does not need to be bridged,  has stopped coumadin in past without difficulty.  To stop coumadin four days prior to surgery.   Patient Education:Alternative treatments to surgery were discussed with patient (and family).  Risks and benefits  of procedure including bleeding,  infection,  hepatobiliary injury,  and the possibility of an open procdedure were fully explained to the patient (and family) who gave informed consent. Patient/family questions were addressed.  Follow-up:Pending Surgery

## 2014-07-03 ENCOUNTER — Ambulatory Visit (HOSPITAL_COMMUNITY): Payer: Medicare Other | Admitting: Anesthesiology

## 2014-07-03 ENCOUNTER — Observation Stay (HOSPITAL_COMMUNITY)
Admission: RE | Admit: 2014-07-03 | Discharge: 2014-07-04 | Disposition: A | Discharge status: Home / Self Care | Payer: Medicare Other | Source: Ambulatory Visit | Attending: General Surgery | Admitting: General Surgery

## 2014-07-03 ENCOUNTER — Encounter (HOSPITAL_COMMUNITY)
Admission: RE | Disposition: A | Discharge status: Home / Self Care | Payer: Self-pay | Source: Ambulatory Visit | Attending: General Surgery

## 2014-07-03 ENCOUNTER — Encounter (HOSPITAL_COMMUNITY): Payer: Self-pay | Admitting: *Deleted

## 2014-07-03 DIAGNOSIS — Z7901 Long term (current) use of anticoagulants: Secondary | ICD-10-CM | POA: Diagnosis not present

## 2014-07-03 DIAGNOSIS — K8010 Calculus of gallbladder with chronic cholecystitis without obstruction: Secondary | ICD-10-CM | POA: Diagnosis not present

## 2014-07-03 DIAGNOSIS — K811 Chronic cholecystitis: Secondary | ICD-10-CM | POA: Diagnosis present

## 2014-07-03 DIAGNOSIS — G4730 Sleep apnea, unspecified: Secondary | ICD-10-CM | POA: Insufficient documentation

## 2014-07-03 DIAGNOSIS — J449 Chronic obstructive pulmonary disease, unspecified: Secondary | ICD-10-CM | POA: Diagnosis not present

## 2014-07-03 DIAGNOSIS — R0602 Shortness of breath: Secondary | ICD-10-CM | POA: Insufficient documentation

## 2014-07-03 DIAGNOSIS — I482 Chronic atrial fibrillation: Secondary | ICD-10-CM | POA: Insufficient documentation

## 2014-07-03 HISTORY — PX: CHOLECYSTECTOMY: SHX55

## 2014-07-03 SURGERY — LAPAROSCOPIC CHOLECYSTECTOMY
Anesthesia: General

## 2014-07-03 MED ORDER — CIPROFLOXACIN IN D5W 400 MG/200ML IV SOLN
INTRAVENOUS | Status: DC | PRN
  Administered 2014-07-03: 400 mg via INTRAVENOUS

## 2014-07-03 MED ORDER — ROCURONIUM BROMIDE 100 MG/10ML IV SOLN
INTRAVENOUS | Status: DC | PRN
  Administered 2014-07-03 (×2): 5 mg via INTRAVENOUS
  Administered 2014-07-03: 30 mg via INTRAVENOUS

## 2014-07-03 MED ORDER — LACTATED RINGERS IV SOLN
INTRAVENOUS | Status: DC
  Administered 2014-07-03: 11:00:00 via INTRAVENOUS

## 2014-07-03 MED ORDER — KETOROLAC TROMETHAMINE 30 MG/ML IJ SOLN
30.0000 mg | Freq: Once | INTRAMUSCULAR | Status: AC
  Administered 2014-07-03: 30 mg via INTRAVENOUS

## 2014-07-03 MED ORDER — POVIDONE-IODINE 10 % OINT PACKET
TOPICAL_OINTMENT | CUTANEOUS | Status: DC | PRN
  Administered 2014-07-03: 1 via TOPICAL

## 2014-07-03 MED ORDER — BUPIVACAINE HCL (PF) 0.5 % IJ SOLN
INTRAMUSCULAR | Status: AC
  Filled 2014-07-03: qty 30

## 2014-07-03 MED ORDER — IPRATROPIUM-ALBUTEROL 18-103 MCG/ACT IN AERO: 2.0000 | INHALATION_SPRAY | Freq: Four times a day (QID) | RESPIRATORY_TRACT | Status: DC | PRN

## 2014-07-03 MED ORDER — METOPROLOL TARTRATE 25 MG PO TABS
25.0000 mg | ORAL_TABLET | Freq: Two times a day (BID) | ORAL | Status: DC
  Administered 2014-07-03 – 2014-07-04 (×2): 25 mg via ORAL
  Filled 2014-07-03 (×2): qty 1

## 2014-07-03 MED ORDER — ONDANSETRON HCL 4 MG/2ML IJ SOLN: 4.0000 mg | Freq: Four times a day (QID) | INTRAMUSCULAR | Status: DC | PRN

## 2014-07-03 MED ORDER — ALLOPURINOL 300 MG PO TABS
300.0000 mg | ORAL_TABLET | Freq: Every day | ORAL | Status: DC
  Administered 2014-07-03: 300 mg via ORAL
  Filled 2014-07-03: qty 1

## 2014-07-03 MED ORDER — GLYCOPYRROLATE 0.2 MG/ML IJ SOLN
INTRAMUSCULAR | Status: AC
  Filled 2014-07-03: qty 3

## 2014-07-03 MED ORDER — NEOSTIGMINE METHYLSULFATE 10 MG/10ML IV SOLN
INTRAVENOUS | Status: DC | PRN
  Administered 2014-07-03: 4 mg via INTRAVENOUS

## 2014-07-03 MED ORDER — IPRATROPIUM-ALBUTEROL 0.5-2.5 (3) MG/3ML IN SOLN: 3.0000 mL | Freq: Four times a day (QID) | RESPIRATORY_TRACT | Status: DC | PRN

## 2014-07-03 MED ORDER — CLONAZEPAM 0.5 MG PO TABS
0.5000 mg | ORAL_TABLET | Freq: Two times a day (BID) | ORAL | Status: DC | PRN
  Administered 2014-07-04: 0.5 mg via ORAL
  Filled 2014-07-03: qty 1

## 2014-07-03 MED ORDER — HEMOSTATIC AGENTS (NO CHARGE) OPTIME
TOPICAL | Status: DC | PRN
  Administered 2014-07-03: 1 via TOPICAL

## 2014-07-03 MED ORDER — FENTANYL CITRATE 0.05 MG/ML IJ SOLN
INTRAMUSCULAR | Status: DC | PRN
  Administered 2014-07-03 (×5): 50 ug via INTRAVENOUS

## 2014-07-03 MED ORDER — MORPHINE SULFATE 2 MG/ML IJ SOLN: 1.0000 mg | INTRAMUSCULAR | Status: DC | PRN

## 2014-07-03 MED ORDER — HYDROCODONE-ACETAMINOPHEN 5-325 MG PO TABS: 1.0000 | ORAL_TABLET | Freq: Four times a day (QID) | ORAL | Status: AC | PRN

## 2014-07-03 MED ORDER — FENTANYL CITRATE 0.05 MG/ML IJ SOLN
25.0000 ug | INTRAMUSCULAR | Status: DC | PRN
  Administered 2014-07-03: 50 ug via INTRAVENOUS
  Administered 2014-07-03: 25 ug via INTRAVENOUS
  Administered 2014-07-03: 50 ug via INTRAVENOUS
  Administered 2014-07-03: 25 ug via INTRAVENOUS
  Filled 2014-07-03 (×2): qty 2

## 2014-07-03 MED ORDER — POLYVINYL ALCOHOL 1.4 % OP SOLN: 1.0000 [drp] | Freq: Every day | OPHTHALMIC | Status: DC | PRN

## 2014-07-03 MED ORDER — PHENYLEPHRINE HCL 10 MG/ML IJ SOLN
INTRAMUSCULAR | Status: AC
  Filled 2014-07-03: qty 1

## 2014-07-03 MED ORDER — SODIUM CHLORIDE 0.9 % IR SOLN
Status: DC | PRN
  Administered 2014-07-03: 1000 mL

## 2014-07-03 MED ORDER — ONDANSETRON HCL 4 MG/2ML IJ SOLN
4.0000 mg | Freq: Once | INTRAMUSCULAR | Status: AC
  Administered 2014-07-03: 4 mg via INTRAVENOUS
  Filled 2014-07-03: qty 2

## 2014-07-03 MED ORDER — FENTANYL CITRATE 0.05 MG/ML IJ SOLN
INTRAMUSCULAR | Status: AC
  Filled 2014-07-03: qty 5

## 2014-07-03 MED ORDER — KETOROLAC TROMETHAMINE 30 MG/ML IJ SOLN
INTRAMUSCULAR | Status: AC
  Filled 2014-07-03: qty 1

## 2014-07-03 MED ORDER — ENOXAPARIN SODIUM 60 MG/0.6ML ~~LOC~~ SOLN
50.0000 mg | SUBCUTANEOUS | Status: DC
  Administered 2014-07-04: 50 mg via SUBCUTANEOUS
  Filled 2014-07-03: qty 0.6

## 2014-07-03 MED ORDER — SIMETHICONE 80 MG PO CHEW
80.0000 mg | CHEWABLE_TABLET | Freq: Four times a day (QID) | ORAL | Status: DC | PRN
  Administered 2014-07-03: 80 mg via ORAL
  Filled 2014-07-03: qty 1

## 2014-07-03 MED ORDER — BUPIVACAINE HCL (PF) 0.5 % IJ SOLN
INTRAMUSCULAR | Status: DC | PRN
  Administered 2014-07-03: 10 mL

## 2014-07-03 MED ORDER — POVIDONE-IODINE 10 % EX OINT
TOPICAL_OINTMENT | CUTANEOUS | Status: AC
  Filled 2014-07-03: qty 1

## 2014-07-03 MED ORDER — LIDOCAINE HCL (CARDIAC) 20 MG/ML IV SOLN
INTRAVENOUS | Status: DC | PRN
  Administered 2014-07-03: 50 mg via INTRAVENOUS

## 2014-07-03 MED ORDER — PHENYLEPHRINE HCL 10 MG/ML IJ SOLN
INTRAMUSCULAR | Status: DC | PRN
  Administered 2014-07-03: 50 ug via INTRAVENOUS

## 2014-07-03 MED ORDER — EPHEDRINE SULFATE 50 MG/ML IJ SOLN
INTRAMUSCULAR | Status: AC
  Filled 2014-07-03: qty 1

## 2014-07-03 MED ORDER — ONDANSETRON HCL 4 MG PO TABS: 4.0000 mg | ORAL_TABLET | Freq: Four times a day (QID) | ORAL | Status: DC | PRN

## 2014-07-03 MED ORDER — ONDANSETRON HCL 4 MG/2ML IJ SOLN: 4.0000 mg | Freq: Once | INTRAMUSCULAR | Status: DC | PRN

## 2014-07-03 MED ORDER — MIDAZOLAM HCL 2 MG/2ML IJ SOLN
1.0000 mg | INTRAMUSCULAR | Status: DC | PRN
  Administered 2014-07-03 (×2): 2 mg via INTRAVENOUS
  Filled 2014-07-03 (×2): qty 2

## 2014-07-03 MED ORDER — SODIUM CHLORIDE 0.9 % IJ SOLN
INTRAMUSCULAR | Status: AC
  Filled 2014-07-03: qty 30

## 2014-07-03 MED ORDER — ACETAMINOPHEN 500 MG PO TABS: 1000.0000 mg | ORAL_TABLET | Freq: Four times a day (QID) | ORAL | Status: DC | PRN

## 2014-07-03 MED ORDER — DEXAMETHASONE SODIUM PHOSPHATE 4 MG/ML IJ SOLN
4.0000 mg | Freq: Once | INTRAMUSCULAR | Status: AC
  Administered 2014-07-03: 4 mg via INTRAVENOUS
  Filled 2014-07-03: qty 1

## 2014-07-03 MED ORDER — PROPOFOL 10 MG/ML IV BOLUS
INTRAVENOUS | Status: DC | PRN
  Administered 2014-07-03: 70 mg via INTRAVENOUS

## 2014-07-03 MED ORDER — LORATADINE 10 MG PO TABS: 10.0000 mg | ORAL_TABLET | Freq: Every day | ORAL | Status: DC

## 2014-07-03 MED ORDER — LACTATED RINGERS IV SOLN
INTRAVENOUS | Status: DC
  Administered 2014-07-03 (×2): via INTRAVENOUS

## 2014-07-03 MED ORDER — HYDROCODONE-ACETAMINOPHEN 5-325 MG PO TABS
1.0000 | ORAL_TABLET | ORAL | Status: DC | PRN
  Administered 2014-07-03: 1 via ORAL
  Filled 2014-07-03: qty 1

## 2014-07-03 MED ORDER — LACTATED RINGERS IV SOLN
INTRAVENOUS | Status: DC | PRN
  Administered 2014-07-03 (×2): via INTRAVENOUS

## 2014-07-03 MED ORDER — CIPROFLOXACIN IN D5W 400 MG/200ML IV SOLN
400.0000 mg | INTRAVENOUS | Status: DC
  Filled 2014-07-03: qty 200

## 2014-07-03 MED ORDER — GLYCOPYRROLATE 0.2 MG/ML IJ SOLN
INTRAMUSCULAR | Status: DC | PRN
  Administered 2014-07-03: 0.6 mg via INTRAVENOUS

## 2014-07-03 SURGICAL SUPPLY — 45 items
APPLIER CLIP LAPSCP 10X32 DD (CLIP) ×2 IMPLANT
BAG HAMPER (MISCELLANEOUS) ×2 IMPLANT
BAG SPEC RTRVL LRG 6X4 10 (ENDOMECHANICALS) ×1
BLADE 11 SAFETY STRL DISP (BLADE) ×1 IMPLANT
CHLORAPREP W/TINT 26ML (MISCELLANEOUS) ×2 IMPLANT
CLOTH BEACON ORANGE TIMEOUT ST (SAFETY) ×2 IMPLANT
COVER LIGHT HANDLE STERIS (MISCELLANEOUS) ×4 IMPLANT
DECANTER SPIKE VIAL GLASS SM (MISCELLANEOUS) ×2 IMPLANT
ELECT REM PT RETURN 9FT ADLT (ELECTROSURGICAL) ×2
ELECTRODE REM PT RTRN 9FT ADLT (ELECTROSURGICAL) ×1 IMPLANT
FILTER SMOKE EVAC LAPAROSHD (FILTER) ×2 IMPLANT
FORMALIN 10 PREFIL 120ML (MISCELLANEOUS) ×2 IMPLANT
GLOVE BIOGEL PI IND STRL 7.0 (GLOVE) IMPLANT
GLOVE BIOGEL PI IND STRL 7.5 (GLOVE) IMPLANT
GLOVE BIOGEL PI INDICATOR 7.0 (GLOVE) ×1
GLOVE BIOGEL PI INDICATOR 7.5 (GLOVE) ×2
GLOVE ECLIPSE 6.5 STRL STRAW (GLOVE) ×2 IMPLANT
GLOVE SURG SS PI 7.5 STRL IVOR (GLOVE) ×3 IMPLANT
GOWN STRL REUS W/ TWL XL LVL3 (GOWN DISPOSABLE) ×1 IMPLANT
GOWN STRL REUS W/TWL LRG LVL3 (GOWN DISPOSABLE) ×6 IMPLANT
GOWN STRL REUS W/TWL XL LVL3 (GOWN DISPOSABLE) ×2
HEMOSTAT SNOW SURGICEL 2X4 (HEMOSTASIS) ×2 IMPLANT
INST SET LAPROSCOPIC AP (KITS) ×2 IMPLANT
IV NS IRRIG 3000ML ARTHROMATIC (IV SOLUTION) IMPLANT
KIT ROOM TURNOVER APOR (KITS) ×2 IMPLANT
MANIFOLD NEPTUNE II (INSTRUMENTS) ×2 IMPLANT
NDL INSUFFLATION 14GA 120MM (NEEDLE) ×1 IMPLANT
NEEDLE INSUFFLATION 14GA 120MM (NEEDLE) ×2 IMPLANT
NS IRRIG 1000ML POUR BTL (IV SOLUTION) ×2 IMPLANT
PACK LAP CHOLE LZT030E (CUSTOM PROCEDURE TRAY) ×2 IMPLANT
PAD ARMBOARD 7.5X6 YLW CONV (MISCELLANEOUS) ×2 IMPLANT
POUCH SPECIMEN RETRIEVAL 10MM (ENDOMECHANICALS) ×2 IMPLANT
SET BASIN LINEN APH (SET/KITS/TRAYS/PACK) ×2 IMPLANT
SET TUBE IRRIG SUCTION NO TIP (IRRIGATION / IRRIGATOR) IMPLANT
SLEEVE ENDOPATH XCEL 5M (ENDOMECHANICALS) ×2 IMPLANT
SPONGE GAUZE 2X2 8PLY STRL LF (GAUZE/BANDAGES/DRESSINGS) ×8 IMPLANT
STAPLER VISISTAT (STAPLE) ×2 IMPLANT
SUT VICRYL 0 UR6 27IN ABS (SUTURE) ×2 IMPLANT
TAPE CLOTH SURG 4X10 WHT LF (GAUZE/BANDAGES/DRESSINGS) ×1 IMPLANT
TROCAR ENDO BLADELESS 11MM (ENDOMECHANICALS) ×2 IMPLANT
TROCAR XCEL NON-BLD 5MMX100MML (ENDOMECHANICALS) ×2 IMPLANT
TROCAR XCEL UNIV SLVE 11M 100M (ENDOMECHANICALS) ×2 IMPLANT
TUBING INSUFFLATION (TUBING) ×2 IMPLANT
WARMER LAPAROSCOPE (MISCELLANEOUS) ×2 IMPLANT
YANKAUER SUCT 12FT TUBE ARGYLE (SUCTIONS) ×2 IMPLANT

## 2014-07-03 NOTE — Op Note (Signed)
Patient:  Nicholas [name]  DOB:  1928-07-08  MRN:  [id number]   Preop Diagnosis:  Chronic cholecystitis  Postop Diagnosis:  Same  Procedure:  Laparoscopic cholecystectomy  Surgeon:  Aviva Signs, M.D.  Anes:  Gen. endotracheal  Indications:  Patient is an 81 year old white male who presents with biliary colic secondary to chronic cholecystitis. The risks and benefits of the procedure including bleeding, infection, hepatobiliary injury, and the possibility of an open procedure were fully explained to the patient, who gave informed consent. The patient was placed the supine position. After induction of general endotracheal anesthesia, the abdomen was prepped and draped using usual sterile technique with DuraPrep. Surgical site confirmation was performed. Procedure note:  The patient was placed the supine position. After induction of general endotracheal anesthesia, the abdomen was prepped and draped using usual sterile technique with DuraPrep. Surgical site confirmation was performed.  A supraumbilical incision was made down to the fascia. A Veress needle was introduced into the abdominal cavity and confirmation of placement was done using the saline drop test. The abdomen was then insufflated to 16 mmHg pressure. 11 mm trocar was introduced into the abdominal cavity under direct visualization without difficulty. The patient was placed in reverse Trendelenburg position and an additional 11 mm trocar was placed the epigastric region 5 mm trochars were placed the right upper quadrant and right flank regions. Liver was inspected and noted within normal limits. The gallbladder was retracted in a dynamic fashion in order to expose the triangle of Calot. The cystic duct was first identified. Its junction to the infundibulum was fully identified. Endoclips placed proximally distally on the cystic duct, and the cystic duct was divided. This was likewise done to cautery. The gallbladder was freed away from the  gallbladder fossa using Bovie electrocautery. The gallbladder was delivered through the epigastric trocar site using an Endo Catch bag. The gallbladder fossa was inspected no abnormal bleeding or bile leakage was noted. Surgicel is placed the gallbladder fossa. All fluid and air were then evacuated from the abdominal cavity prior to removal of the trochars.  All wounds were irrigated normal saline. All wounds were injected with 0.5% Sensorcaine. The supraumbilical fascia was reapproximated using an 0 Vicryl interrupted suture. All skin incisions were closed using staples. Betadine ointment and dry sterile dressings were placed.  All tape and needle counts were correct the end of the procedure. Patient was extubated in the operating room and transferred to PACU in stable condition.  Complications:  None  EBL:  Minimal  Specimen:  Gallbladder

## 2014-07-03 NOTE — Anesthesia Procedure Notes (Signed)
Procedure Name: Intubation Date/Time: 07/03/2014 11:56 AM Performed by: Andree Elk, AMY A Pre-anesthesia Checklist: Patient identified, Timeout performed, Emergency Drugs available, Patient being monitored and Suction available Patient Re-evaluated:Patient Re-evaluated prior to inductionOxygen Delivery Method: Circle system utilized Preoxygenation: Pre-oxygenation with 100% oxygen Intubation Type: IV induction Ventilation: Mask ventilation without difficulty Laryngoscope Size: Miller and 3 Grade View: Grade III Tube type: Oral Tube size: 7.0 mm Number of attempts: 2 (Unable to visualize cords first attempt; repositioned head; Grade 3 view 2nd attempt) Placement Confirmation: ETT inserted through vocal cords under direct vision,  positive ETCO2 and breath sounds checked- equal and bilateral Secured at: 21 cm Tube secured with: Tape Dental Injury: Teeth and Oropharynx as per pre-operative assessment

## 2014-07-03 NOTE — Discharge Instructions (Signed)
Restart coumadin on 07/05/14   Laparoscopic Cholecystectomy, Care After Refer to this sheet in the next few weeks. These instructions provide you with information on caring for yourself after your procedure. Your health care provider may also give you more specific instructions. Your treatment has been planned according to current medical practices, but problems sometimes occur. Call your health care provider if you have any problems or questions after your procedure. WHAT TO EXPECT AFTER THE PROCEDURE After your procedure, it is typical to have the following:  Pain at your incision sites. You will be given pain medicines to control the pain.  Mild nausea or vomiting. This should improve after the first 24 hours.  Bloating and possibly shoulder pain from the gas used during the procedure. This will improve after the first 24 hours. HOME CARE INSTRUCTIONS   Change bandages (dressings) as directed by your health care provider.  Keep the wound dry and clean. You may wash the wound gently with soap and water. Gently blot or dab the area dry.  Do not take baths or use swimming pools or hot tubs for 2 weeks or until your health care provider approves.  Only take over-the-counter or prescription medicines as directed by your health care provider.  Continue your normal diet as directed by your health care provider.  Do not lift anything heavier than 10 pounds (4.5 kg) until your health care provider approves.  Do not play contact sports for 1 week or until your health care provider approves. SEEK MEDICAL CARE IF:   You have redness, swelling, or increasing pain in the wound.  You notice yellowish-white fluid (pus) coming from the wound.  You have drainage from the wound that lasts longer than 1 day.  You notice a bad smell coming from the wound or dressing.  Your surgical cuts (incisions) break open. SEEK IMMEDIATE MEDICAL CARE IF:   You develop a rash.  You have difficulty  breathing.  You have chest pain.  You have a fever.  You have increasing pain in the shoulders (shoulder strap areas).  You have dizzy episodes or faint while standing.  You have severe abdominal pain.  You feel sick to your stomach (nauseous) or throw up (vomit) and this lasts for more than 1 day. Document Released: 06/14/2005 Document Revised: 04/04/2013 Document Reviewed: 01/24/2013 [facility] Patient Information 2015 [place], [place]. This information is not intended to replace advice given to you by your health care provider. Make sure you discuss any questions you have with your health care provider.

## 2014-07-03 NOTE — Interval H&P Note (Signed)
History and Physical Interval Note:  07/03/2014 11:10 AM  Nicholas [name]  has presented today for surgery, with the diagnosis of cholelithiasis  The various methods of treatment have been discussed with the patient and family. After consideration of risks, benefits and other options for treatment, the patient has consented to  Procedure(s): LAPAROSCOPIC CHOLECYSTECTOMY (N/A) as a surgical intervention .  The patient's history has been reviewed, patient examined, no change in status, stable for surgery.  I have reviewed the patient's chart and labs.  Questions were answered to the patient's satisfaction.     Aviva Signs A

## 2014-07-03 NOTE — Anesthesia Postprocedure Evaluation (Signed)
  Anesthesia Post-op Note Late Entry Patient: Nicholas [name]  Procedure(s) Performed: Procedure(s): LAPAROSCOPIC CHOLECYSTECTOMY (N/A)  Patient Location: PACU  Anesthesia Type:General  Level of Consciousness: awake, alert , oriented and patient cooperative  Airway and Oxygen Therapy: Patient Spontanous Breathing  Post-op Pain: mild  Post-op Assessment: Post-op Vital signs reviewed, Patient's Cardiovascular Status Stable, Respiratory Function Stable, Patent Airway, No signs of Nausea or vomiting and Pain level controlled  Post-op Vital Signs: Reviewed and stable  Last Vitals:  Filed Vitals:   07/03/14 1400  BP:   Pulse: 82  Temp:   Resp: 21    Complications: No apparent anesthesia complications

## 2014-07-03 NOTE — Anesthesia Preprocedure Evaluation (Signed)
Anesthesia Evaluation  Patient identified by MRN, date of birth, ID band Patient awake    Reviewed: Allergy & Precautions, NPO status , Patient's Chart, lab work & pertinent test results  Airway Mallampati: II  TM Distance: >3 FB     Dental  (+) Partial Upper, Partial Lower   Pulmonary shortness of breath and with exertion, sleep apnea , COPDformer smoker,  breath sounds clear to auscultation        Cardiovascular hypertension, Pt. on medications Rhythm:Irregular Rate:Normal     Neuro/Psych    GI/Hepatic GERD-  ,  Endo/Other    Renal/GU      Musculoskeletal  (+) Arthritis -,   Abdominal   Peds  Hematology   Anesthesia Other Findings   Reproductive/Obstetrics                             Anesthesia Physical Anesthesia Plan  ASA: III  Anesthesia Plan: General   Post-op Pain Management:    Induction: Intravenous  Airway Management Planned: Oral ETT  Additional Equipment:   Intra-op Plan:   Post-operative Plan: Extubation in OR  Informed Consent: I have reviewed the patients History and Physical, chart, labs and discussed the procedure including the risks, benefits and alternatives for the proposed anesthesia with the patient or authorized representative who has indicated his/her understanding and acceptance.     Plan Discussed with:   Anesthesia Plan Comments:         Anesthesia Quick Evaluation

## 2014-07-03 NOTE — Transfer of Care (Signed)
Immediate Anesthesia Transfer of Care Note  Patient: Nicholas [name]  Procedure(s) Performed: Procedure(s): LAPAROSCOPIC CHOLECYSTECTOMY (N/A)  Patient Location: PACU  Anesthesia Type:General  Level of Consciousness: sedated and patient cooperative  Airway & Oxygen Therapy: Patient Spontanous Breathing and Patient connected to face mask oxygen  Post-op Assessment: Report given to PACU RN and Post -op Vital signs reviewed and stable  Post vital signs: Reviewed and stable  Complications: No apparent anesthesia complications

## 2014-07-04 ENCOUNTER — Encounter (HOSPITAL_COMMUNITY): Payer: Self-pay | Admitting: General Surgery

## 2014-07-04 DIAGNOSIS — K8010 Calculus of gallbladder with chronic cholecystitis without obstruction: Secondary | ICD-10-CM | POA: Diagnosis not present

## 2014-07-04 NOTE — Addendum Note (Signed)
Addendum  created 07/04/14 7737 by Charmaine Downs, CRNA   Modules edited: Notes Section   Notes Section:  File: [id number]

## 2014-07-04 NOTE — Discharge Summary (Signed)
Physician Discharge Summary  Patient ID: Nicholas [name] MRN: [id number] DOB/AGE: 07-14-1928 81 y.o.  Admit date: 07/03/2014 Discharge date: 07/04/2014  Admission Diagnoses: Chronic cholecystitis, atrial fibrillation, chronic anticoagulation  Discharge Diagnoses: Same Active Problems:   Chronic cholecystitis   Discharged Condition: good  Hospital Course: Patient is an 81 year old white male who presented for laparoscopic cholecystectomy due to chronic cholecystitis. He had stopped his Coumadin. He underwent surgery on 07/03/2014. Tolerated the procedure well. His postoperative course has been unremarkable. His nausea and abdominal pain have been well controlled. He is being discharged home on postoperative day one in good improving condition. He is to restart his Coumadin on 07/05/2014.  Treatments: surgery: Laparoscopic cholecystectomy on 07/03/2014  Discharge Exam: Blood pressure 119/61, pulse 89, temperature 98 F (36.7 C), temperature source Oral, resp. rate 18, height 5\' 9"  (1.753 m), weight 109.77 kg (242 lb), SpO2 94 %. General appearance: alert, cooperative and no distress Resp: clear to auscultation bilaterally Cardio: irregularly irregular rhythm GI: Soft, dressings dry and intact.  Disposition: 01-Home or Self Care     Medication List    TAKE these medications        acetaminophen 500 MG tablet  Commonly known as:  TYLENOL  Take 1,000 mg by mouth every 6 (six) hours as needed for moderate pain.     allopurinol 300 MG tablet  Commonly known as:  ZYLOPRIM  Take 300 mg by mouth daily.     cholecalciferol 1000 UNITS tablet  Commonly known as:  VITAMIN D  Take 1,000 Units by mouth daily.     clonazePAM 0.5 MG tablet  Commonly known as:  KLONOPIN  Take 0.5 mg by mouth 2 (two) times daily as needed for anxiety.     COMBIVENT 18-103 MCG/ACT inhaler  Generic drug:  albuterol-ipratropium  Inhale 2 puffs into the lungs every 6 (six) hours as needed (uses rarely). UAD  PRN     HYDROcodone-acetaminophen 5-325 MG per tablet  Commonly known as:  NORCO/VICODIN  Take 1 tablet by mouth every 6 (six) hours as needed for moderate pain.     hydroxypropyl methylcellulose / hypromellose 2.5 % ophthalmic solution  Commonly known as:  ISOPTO TEARS / GONIOVISC  Place 1 drop into both eyes daily as needed for dry eyes.     loratadine 10 MG tablet  Commonly known as:  CLARITIN  Take 10 mg by mouth daily.     metoprolol tartrate 25 MG tablet  Commonly known as:  LOPRESSOR  Take 1 tablet (25 mg total) by mouth 2 (two) times daily.     ondansetron 4 MG tablet  Commonly known as:  ZOFRAN  Take 1 tablet (4 mg total) by mouth every 8 (eight) hours as needed for nausea or vomiting.     PRESERVISION AREDS Caps  Take 1 capsule by mouth 2 (two) times daily.     simethicone 125 MG chewable tablet  Commonly known as:  MYLICON  Chew 703 mg by mouth every 6 (six) hours as needed for flatulence.     vitamin B-12 1000 MCG tablet  Commonly known as:  CYANOCOBALAMIN  Take 1,000 mcg by mouth daily.     warfarin 5 MG tablet  Commonly known as:  COUMADIN  - Take 2.5-5 mg by mouth See admin instructions. Take 5 mg on Mondays . Take 2.5 mg all other days.  -   - UAD - managed by [name]           Follow-up Information  Follow up with [name], MD. Schedule an appointment as soon as possible for a visit on 07/16/2014.   Specialty:  General Surgery   Contact information:   [id number] [place] [address] [phone]       Signed: Aviva Signs A 07/04/2014, 8:59 AM

## 2014-07-04 NOTE — Anesthesia Postprocedure Evaluation (Signed)
  Anesthesia Post-op Note  Patient: Nicholas [name]  Procedure(s) Performed: Procedure(s): LAPAROSCOPIC CHOLECYSTECTOMY (N/A)  Patient Location: roon [address]  Anesthesia Type:General  Level of Consciousness: awake, alert , oriented and patient cooperative  Airway and Oxygen Therapy: Patient Spontanous Breathing  Post-op Pain: none  Post-op Assessment: Post-op Vital signs reviewed, Patient's Cardiovascular Status Stable, Respiratory Function Stable, Patent Airway, No signs of Nausea or vomiting, Pain level controlled and No headache  Post-op Vital Signs: Reviewed and stable  Last Vitals:  Filed Vitals:   07/04/14 0614  BP: 119/61  Pulse: 89  Temp: 36.7 C  Resp: 18    Complications: No apparent anesthesia complications

## 2014-09-03 ENCOUNTER — Ambulatory Visit (INDEPENDENT_AMBULATORY_CARE_PROVIDER_SITE_OTHER): Payer: Medicare Other | Admitting: Urology

## 2014-09-03 DIAGNOSIS — C61 Malignant neoplasm of prostate: Secondary | ICD-10-CM | POA: Diagnosis not present

## 2014-10-11 ENCOUNTER — Ambulatory Visit (INDEPENDENT_AMBULATORY_CARE_PROVIDER_SITE_OTHER): Payer: Medicare Other | Admitting: Internal Medicine

## 2014-10-11 ENCOUNTER — Encounter (INDEPENDENT_AMBULATORY_CARE_PROVIDER_SITE_OTHER): Payer: Self-pay | Admitting: Internal Medicine

## 2014-10-11 VITALS — BP 136/70 | HR 66 | Temp 97.9°F | Ht 69.0 in | Wt 236.6 lb

## 2014-10-11 DIAGNOSIS — R110 Nausea: Secondary | ICD-10-CM | POA: Diagnosis not present

## 2014-10-11 DIAGNOSIS — R197 Diarrhea, unspecified: Secondary | ICD-10-CM

## 2014-10-11 MED ORDER — ONDANSETRON HCL 4 MG PO TABS: 4.0000 mg | ORAL_TABLET | Freq: Three times a day (TID) | ORAL | Status: DC | PRN

## 2014-10-11 NOTE — Patient Instructions (Signed)
Imodium one in am and one in pm

## 2014-10-11 NOTE — Progress Notes (Signed)
Subjective:    Patient ID: Nicholas [name], male    DOB: 07-31-1928, 81 y.o.   MRN: [id number]  HPI Presents today  c/o that some of his stools are loose at time. He will have a normal BM, then have a loose one. Sometimes he feels like he has fecal leakage but has not seen any. He has some nausea which he has Zofran for.  Underwent a cholecystectomy in January for chronic cholecystitis in June.  Since his surgery he feels weak and sluggish.  Appetite is good. No weight loss. No abdominal pain.   Colonoscopy in 2013 by Dr. Truddie Hidden. Revealed no colonic neoplasms. 5 polyps.  Biopsy: Tibi;ar ademp,a  Review of Systems Past Medical History  Diagnosis Date  . Obstructive sleep apnea   . COPD (chronic obstructive pulmonary disease)   . Osteoarthritis   . Atrial fibrillation   . Malignant neoplasm of prostate   . Essential hypertension, benign   . Esophageal reflux   . Cervical spondylosis without myelopathy   . Diverticulosis of colon (without mention of hemorrhage)     Past Surgical History  Procedure Laterality Date  . Total knee arthroplasty      2000, 2005.   [name] Rotator cuff repair    . Esophagogastroduodenoscopy (egd) with esophageal dilation N/A 06/18/2013    Procedure: ESOPHAGOGASTRODUODENOSCOPY (EGD) WITH ESOPHAGEAL DILATION;  Surgeon: [name], MD;  Location: [facility];  Service: Endoscopy;  Laterality: N/A;  220-rescheduled to 730 Ann to notify pt  . Colonoscopy    . Cholecystectomy N/A 07/03/2014    Procedure: LAPAROSCOPIC CHOLECYSTECTOMY;  Surgeon: [name], MD;  Location: [facility];  Service: General;  Laterality: N/A;    Allergies  Allergen Reactions  . Sulfonamide Derivatives     REACTION: rash    Current Outpatient Prescriptions on File Prior to Visit  Medication Sig Dispense Refill  . acetaminophen (TYLENOL) 500 MG tablet Take 1,000 mg by mouth every 6 (six) hours as needed for moderate pain.    [name] albuterol-ipratropium (COMBIVENT) 18-103 MCG/ACT  inhaler Inhale 2 puffs into the lungs every 6 (six) hours as needed (uses rarely). UAD PRN    . allopurinol (ZYLOPRIM) 300 MG tablet Take 300 mg by mouth daily.      . Cholecalciferol (VITAMIN D3) 1000 UNITS tablet Take 1,000 Units by mouth daily.      . clonazePAM (KLONOPIN) 0.5 MG tablet Take 0.5 mg by mouth 2 (two) times daily as needed for anxiety.    [name] HYDROcodone-acetaminophen (NORCO/VICODIN) 5-325 MG per tablet Take 1 tablet by mouth every 6 (six) hours as needed for moderate pain. 40 tablet 0  . hydroxypropyl methylcellulose / hypromellose (ISOPTO TEARS / GONIOVISC) 2.5 % ophthalmic solution Place 1 drop into both eyes daily as needed for dry eyes.    [name] loratadine (CLARITIN) 10 MG tablet Take 10 mg by mouth daily.    . metoprolol tartrate (LOPRESSOR) 25 MG tablet Take 1 tablet (25 mg total) by mouth 2 (two) times daily. 180 tablet 3  . Multiple Vitamins-Minerals (PRESERVISION AREDS) CAPS Take 1 capsule by mouth 2 (two) times daily.     . ondansetron (ZOFRAN) 4 MG tablet Take 1 tablet (4 mg total) by mouth every 8 (eight) hours as needed for nausea or vomiting. 30 tablet 1  . simethicone (MYLICON) 594 MG chewable tablet Chew 125 mg by mouth every 6 (six) hours as needed for flatulence.    . vitamin B-12 (CYANOCOBALAMIN) 1000 MCG tablet Take  1,000 mcg by mouth daily.      [name] warfarin (COUMADIN) 5 MG tablet Take 2.5-5 mg by mouth See admin instructions. Take 5 mg on Mondays . Take 2.5 mg all other days.  UAD - managed by [name]     No current facility-administered medications on file prior to visit.        Objective:   Physical ExamBlood pressure 136/70, pulse 66, temperature 97.9 F (36.6 C), height 5\' 9"  (1.753 m), weight 236 lb 9.6 oz (107.321 kg). Alert and oriented. Skin warm and dry. Oral mucosa is moist.   . Sclera anicteric, conjunctivae is pink. Thyroid not enlarged. No cervical lymphadenopathy. Lungs clear. Heart regular rate and rhythm.  Abdomen is soft. Bowel sounds are  positive. No hepatomegaly. No abdominal masses felt. No tenderness.  No edema to lower extremities.          Assessment & Plan:  Diarrhea on and off since GB surgery in January of this year. Will try on Imodium one in am and one in pm Increase fiber. OV in 6 months.

## 2014-11-29 ENCOUNTER — Ambulatory Visit (INDEPENDENT_AMBULATORY_CARE_PROVIDER_SITE_OTHER): Payer: Medicare Other | Admitting: Cardiology

## 2014-11-29 ENCOUNTER — Encounter: Payer: Self-pay | Admitting: Cardiology

## 2014-11-29 VITALS — BP 118/70 | HR 66 | Ht 69.0 in | Wt 240.0 lb

## 2014-11-29 DIAGNOSIS — I482 Chronic atrial fibrillation, unspecified: Secondary | ICD-10-CM

## 2014-11-29 DIAGNOSIS — I10 Essential (primary) hypertension: Secondary | ICD-10-CM | POA: Diagnosis not present

## 2014-11-29 NOTE — Progress Notes (Signed)
Cardiology Office Note  Date: 11/29/2014   ID: Nicholas [name], DOB July 01, 1928, MRN [id number]  PCP: Nicholas Blitz, MD  Primary Cardiologist: Nicholas Lesches, MD   Chief Complaint  Patient presents with  . Atrial Fibrillation    History of Present Illness:  Nicholas [name] is an 81 y.o. male last seen in December 2015. He presents for a routine follow-up visit. He complains of chronic fatigue, but otherwise no changes. He does not endorse any palpitations or chest pain. I reviewed his medications which are outlined below. He continues on Lopressor and Coumadin, denies any bleeding problems.  Record review finds laparoscopic cholecystectomy back in January of this year with Dr. Arnoldo Morale.  ECG today shows atrial fibrillation with low voltage.   Past Medical History  Diagnosis Date  . Obstructive sleep apnea   . COPD (chronic obstructive pulmonary disease)   . Osteoarthritis   . Atrial fibrillation   . Malignant neoplasm of prostate   . Essential hypertension, benign   . Esophageal reflux   . Cervical spondylosis without myelopathy   . Diverticulosis of colon (without mention of hemorrhage)      Current Outpatient Prescriptions  Medication Sig Dispense Refill  . acetaminophen (TYLENOL) 500 MG tablet Take 1,000 mg by mouth every 6 (six) hours as needed for moderate pain.    [name] albuterol-ipratropium (COMBIVENT) 18-103 MCG/ACT inhaler Inhale 2 puffs into the lungs every 6 (six) hours as needed (uses rarely). UAD PRN    . allopurinol (ZYLOPRIM) 300 MG tablet Take 300 mg by mouth daily.      . Cholecalciferol (VITAMIN D3) 1000 UNITS tablet Take 1,000 Units by mouth daily.      [name] HYDROcodone-acetaminophen (NORCO/VICODIN) 5-325 MG per tablet Take 1 tablet by mouth every 6 (six) hours as needed for moderate pain. 40 tablet 0  . hydroxypropyl methylcellulose / hypromellose (ISOPTO TEARS / GONIOVISC) 2.5 % ophthalmic solution Place 1 drop into both eyes daily as needed for dry eyes.    [name]  loratadine (CLARITIN) 10 MG tablet Take 10 mg by mouth daily.    . metoprolol tartrate (LOPRESSOR) 25 MG tablet Take 1 tablet (25 mg total) by mouth 2 (two) times daily. 180 tablet 3  . Multiple Vitamins-Minerals (PRESERVISION AREDS) CAPS Take 1 capsule by mouth 2 (two) times daily.     . ondansetron (ZOFRAN) 4 MG tablet Take 1 tablet (4 mg total) by mouth every 8 (eight) hours as needed for nausea or vomiting. 30 tablet 1  . ondansetron (ZOFRAN) 4 MG tablet Take 1 tablet (4 mg total) by mouth every 8 (eight) hours as needed for nausea or vomiting. 60 tablet 1  . simethicone (MYLICON) 841 MG chewable tablet Chew 125 mg by mouth every 6 (six) hours as needed for flatulence.    . vitamin B-12 (CYANOCOBALAMIN) 1000 MCG tablet Take 1,000 mcg by mouth daily.      [name] warfarin (COUMADIN) 5 MG tablet Take 2.5-5 mg by mouth See admin instructions. Take 5 mg on Mondays . Take 2.5 mg all other days.  UAD - managed by [name]     No current facility-administered medications for this visit.    Allergies:  Sulfonamide derivatives   Social History: The patient  reports that he quit smoking about 49 years ago. His smoking use included Cigarettes. He has a 18 pack-year smoking history. He quit smokeless tobacco use about 47 years ago. His smokeless tobacco use included Chew. He reports that he does not drink alcohol  or use illicit drugs.    ROS:  Please see the history of present illness. Otherwise, complete review of systems is positive for intermittent abdominal discomfort.  All other systems are reviewed and negative.   Physical Exam: VS:  BP 118/70 mmHg  Pulse 66  Ht 5\' 9"  (1.753 m)  Wt 240 lb (108.863 kg)  BMI 35.43 kg/m2  SpO2 98%, BMI Body mass index is 35.43 kg/(m^2).  Wt Readings from Last 3 Encounters:  11/29/14 240 lb (108.863 kg)  10/11/14 236 lb 9.6 oz (107.321 kg)  07/03/14 242 lb (109.77 kg)     Obese male in no acute distress.  HEENT: Conjunctiva and lids normal, oropharynx with  moist mucosa.  Neck: Supple, no elevated JVP.  Lungs: Clear to auscultation, diminished, no wheezing.  Cardiac: Irregularly irregular, no S3 or rub.  Abdomen: Nontender, bowel sounds present,.  Extremities: No pitting edema. Distal pulses are 2+.   ECG: ECG is ordered today and shows atrial fibrillation with low voltage.   Recent Labwork:  11/08/2014: Hgb 13.1, platelets 185, TSH 1.9, BUN 12, creatinine 0.89, potassium 5.9, AST 22, ALT 10 08/23/2014: cholesterol 165, HDL 59, LDL 93  Other Studies Reviewed Today:  1. Echocardiogram in February 2014 revealed LVEF 60-65%, mild aortic regurgitation, mildly dilated aortic root and ascending aorta, mild mitral regurgitation, moderate left atrial enlargement, moderately dilated right ventricle with normal function, moderate to severe right atrial enlargement without obvious PFO, mild tricuspid regurgitation, PASP 31 mm mercury.   2. Exercise echocardiogram revealed limited exercise tolerance at adequate heart rate response with no diagnostic ST segment depression. Echocardiographic images revealed no ischemia in the apical 2 chamber view, other views were not adequate. Generally felt to be low risk on review.   Assessment and Plan:  1. Chronic atrial fibrillation, continue strategy of heart rate control and anticoagulation.  2. Essential hypertension, blood pressure is well controlled today.  Current medicines were reviewed with the patient today.   Orders Placed This Encounter  Procedures  . EKG 12-Lead    Disposition: FU with me in 6 months.   Signed, Satira Sark, MD, [facility] 11/29/2014 11:35 AM    [facility] at [place], [place], [place] [address] Phone: [phone]; Fax: [phone]

## 2014-11-29 NOTE — Patient Instructions (Signed)
Your physician recommends that you continue on your current medications as directed. Please refer to the Current Medication list given to you today. Your physician recommends that you schedule a follow-up appointment in: 6 months. You will receive a reminder letter in the mail in about 4 months reminding you to call and schedule your appointment. If you don't receive this letter, please contact our office. 

## 2015-03-12 ENCOUNTER — Ambulatory Visit (INDEPENDENT_AMBULATORY_CARE_PROVIDER_SITE_OTHER): Payer: Medicare Other | Admitting: Internal Medicine

## 2015-03-12 ENCOUNTER — Encounter (INDEPENDENT_AMBULATORY_CARE_PROVIDER_SITE_OTHER): Payer: Self-pay | Admitting: Internal Medicine

## 2015-03-12 VITALS — BP 120/80 | HR 64 | Temp 97.9°F | Ht 69.0 in | Wt 234.7 lb

## 2015-03-12 DIAGNOSIS — R110 Nausea: Secondary | ICD-10-CM

## 2015-03-12 DIAGNOSIS — R197 Diarrhea, unspecified: Secondary | ICD-10-CM

## 2015-03-12 LAB — HEPATIC FUNCTION PANEL
ALT: 6 U/L — AB (ref 9–46)
AST: 19 U/L (ref 10–35)
Albumin: 3.8 g/dL (ref 3.6–5.1)
Alkaline Phosphatase: 68 U/L (ref 40–115)
BILIRUBIN DIRECT: 0.1 mg/dL (ref <=0.2)
Indirect Bilirubin: 0.5 mg/dL (ref 0.2–1.2)
TOTAL PROTEIN: 6.1 g/dL (ref 6.1–8.1)
Total Bilirubin: 0.6 mg/dL (ref 0.2–1.2)

## 2015-03-12 LAB — CBC WITH DIFFERENTIAL/PLATELET
Basophils Absolute: 0 10*3/uL (ref 0.0–0.1)
Basophils Relative: 0 % (ref 0–1)
EOS PCT: 3 % (ref 0–5)
Eosinophils Absolute: 0.2 10*3/uL (ref 0.0–0.7)
HEMATOCRIT: 40.9 % (ref 39.0–52.0)
Hemoglobin: 13.5 g/dL (ref 13.0–17.0)
LYMPHS ABS: 1.7 10*3/uL (ref 0.7–4.0)
LYMPHS PCT: 29 % (ref 12–46)
MCH: 33.3 pg (ref 26.0–34.0)
MCHC: 33 g/dL (ref 30.0–36.0)
MCV: 100.7 fL — AB (ref 78.0–100.0)
MPV: 10.9 fL (ref 8.6–12.4)
Monocytes Absolute: 0.5 10*3/uL (ref 0.1–1.0)
Monocytes Relative: 8 % (ref 3–12)
NEUTROS ABS: 3.4 10*3/uL (ref 1.7–7.7)
Neutrophils Relative %: 60 % (ref 43–77)
Platelets: 197 10*3/uL (ref 150–400)
RBC: 4.06 MIL/uL — ABNORMAL LOW (ref 4.22–5.81)
RDW: 15.1 % (ref 11.5–15.5)
WBC: 5.7 10*3/uL (ref 4.0–10.5)

## 2015-03-12 NOTE — Patient Instructions (Signed)
Labs. Continue the Zofran. OV in 3 months.

## 2015-03-12 NOTE — Progress Notes (Signed)
Subjective:    Patient ID: Nicholas [name], male    DOB: [birth year], 81 y.o.   MRN: [id number]  HPIPresents today with c/o of nausea. He has bloating. He has nausea off and on since before he had his GB surgery in January.  He says he has a lot of saliva. He c/o loose stools.He has urgency. Symptoms started after GB surgery. He may have diarrhea every 3 days.  Appetite is okay. Sometimes he makes himself to eat. There has been no weight loss.  He chews his food well.  Sometimes it feels like his food is slow to go down. No acid reflux.  He does have flatus.  Last colonoscopy in 2013 by Dr. Truddie Hidden.     .    Colonoscopy in 2013 by Dr. Truddie Hidden. Revealed no colonic neoplasms. 5 polyps.  Biopsy: Tubular adenoma.  Review of Systems     Past Medical History  Diagnosis Date  . Obstructive sleep apnea   . COPD (chronic obstructive pulmonary disease)   . Osteoarthritis   . Atrial fibrillation   . Malignant neoplasm of prostate   . Essential hypertension, benign   . Esophageal reflux   . Cervical spondylosis without myelopathy   . Diverticulosis of colon (without mention of hemorrhage)     Past Surgical History  Procedure Laterality Date  . Total knee arthroplasty      2000, 2005.   [name] Rotator cuff repair    . Esophagogastroduodenoscopy (egd) with esophageal dilation N/A 06/18/2013    Procedure: ESOPHAGOGASTRODUODENOSCOPY (EGD) WITH ESOPHAGEAL DILATION;  Surgeon: [name], MD;  [facility]: [facility];  Service: Endoscopy;  Laterality: N/A;  220-rescheduled to 730 Ann to notify pt  . Colonoscopy    . Cholecystectomy N/A 07/03/2014    Procedure: LAPAROSCOPIC CHOLECYSTECTOMY;  Surgeon: [name], MD;  Location: [facility] ORS;  Service: General;  Laterality: N/A;    Allergies  Allergen Reactions  . Sulfonamide Derivatives     REACTION: rash    Current Outpatient Prescriptions on File Prior to Visit  Medication Sig Dispense Refill  . acetaminophen (TYLENOL) 500 MG  tablet Take 1,000 mg by mouth every 6 (six) hours as needed for moderate pain.    [name] albuterol-ipratropium (COMBIVENT) 18-103 MCG/ACT inhaler Inhale 2 puffs into the lungs every 6 (six) hours as needed (uses rarely). UAD PRN    . allopurinol (ZYLOPRIM) 300 MG tablet Take 300 mg by mouth daily.      . Cholecalciferol (VITAMIN D3) 1000 UNITS tablet Take 1,000 Units by mouth daily.      [name] HYDROcodone-acetaminophen (NORCO/VICODIN) 5-325 MG per tablet Take 1 tablet by mouth every 6 (six) hours as needed for moderate pain. 40 tablet 0  . hydroxypropyl methylcellulose / hypromellose (ISOPTO TEARS / GONIOVISC) 2.5 % ophthalmic solution Place 1 drop into both eyes daily as needed for dry eyes.    [name] loratadine (CLARITIN) 10 MG tablet Take 10 mg by mouth daily as needed.     . metoprolol tartrate (LOPRESSOR) 25 MG tablet Take 1 tablet (25 mg total) by mouth 2 (two) times daily. 180 tablet 3  . Multiple Vitamins-Minerals (PRESERVISION AREDS) CAPS Take 1 capsule by mouth 2 (two) times daily.     . ondansetron (ZOFRAN) 4 MG tablet Take 1 tablet (4 mg total) by mouth every 8 (eight) hours as needed for nausea or vomiting. 30 tablet 1  . ondansetron (ZOFRAN) 4 MG tablet Take 1 tablet (4 mg total) by mouth every 8 (  eight) hours as needed for nausea or vomiting. 60 tablet 1  . simethicone (MYLICON) 223 MG chewable tablet Chew 125 mg by mouth every 6 (six) hours as needed for flatulence.    . vitamin B-12 (CYANOCOBALAMIN) 1000 MCG tablet Take 1,000 mcg by mouth daily.      [name] warfarin (COUMADIN) 5 MG tablet Take 2.5-5 mg by mouth See admin instructions. Take 5 mg on Mondays . Take 2.5 mg all other days.  UAD - managed by [name]     No current facility-administered medications on file prior to visit.     Objective:   Physical ExamBlood pressure 120/80, pulse 64, temperature 97.9 F (36.6 C), height 5\' 9"  (1.753 m), weight 234 lb 11.2 oz (106.459 kg). Alert and oriented. Skin warm and dry. Oral mucosa is moist.    . Sclera anicteric, conjunctivae is pink. Thyroid not enlarged. No cervical lymphadenopathy. Lungs clear. Heart regular rate and rhythm.  Abdomen is soft. Bowel sounds are positive. No hepatomegaly. No abdominal masses felt. No tenderness.  No edema to lower extremities. Stool brown and guiaiac negative    Developer: [id number] ( 04/2018) Card: Lot [id number] (12/18)     Assessment & Plan:  Nausea: occasonally.  Diarrhea: Imodium as needed.  GI pathogen, CBC, Hepatic function.  Further recommendations to follow

## 2015-03-17 ENCOUNTER — Telehealth (INDEPENDENT_AMBULATORY_CARE_PROVIDER_SITE_OTHER): Payer: Self-pay | Admitting: Internal Medicine

## 2015-03-17 DIAGNOSIS — R1314 Dysphagia, pharyngoesophageal phase: Secondary | ICD-10-CM

## 2015-03-17 NOTE — Telephone Encounter (Signed)
Patient states foods are slow to go down. Will get an esophagram.

## 2015-03-18 NOTE — Telephone Encounter (Signed)
Esophagram sch'd 03/20/15 at 830 (815), npo after midnight, patient aware

## 2015-03-20 ENCOUNTER — Encounter (INDEPENDENT_AMBULATORY_CARE_PROVIDER_SITE_OTHER): Payer: Self-pay

## 2015-03-20 ENCOUNTER — Ambulatory Visit (HOSPITAL_COMMUNITY)
Admission: RE | Admit: 2015-03-20 | Discharge: 2015-03-20 | Disposition: A | Discharge status: Home / Self Care | Payer: Medicare Other | Source: Ambulatory Visit | Attending: Internal Medicine | Admitting: Internal Medicine

## 2015-03-20 DIAGNOSIS — R1314 Dysphagia, pharyngoesophageal phase: Secondary | ICD-10-CM | POA: Insufficient documentation

## 2015-05-13 ENCOUNTER — Other Ambulatory Visit: Payer: Self-pay | Admitting: Cardiology

## 2015-06-03 ENCOUNTER — Encounter: Payer: Self-pay | Admitting: Cardiology

## 2015-06-03 ENCOUNTER — Ambulatory Visit (INDEPENDENT_AMBULATORY_CARE_PROVIDER_SITE_OTHER): Payer: Medicare Other | Admitting: Cardiology

## 2015-06-03 VITALS — BP 130/82 | HR 84 | Ht 69.0 in | Wt 231.0 lb

## 2015-06-03 DIAGNOSIS — I77810 Thoracic aortic ectasia: Secondary | ICD-10-CM | POA: Diagnosis not present

## 2015-06-03 DIAGNOSIS — I482 Chronic atrial fibrillation, unspecified: Secondary | ICD-10-CM

## 2015-06-03 NOTE — Progress Notes (Signed)
Cardiology Office Note  Date: 06/03/2015   ID: [name], DOB [birth year], MRN [id number]  PCP: [name], MD  Primary Cardiologist: [name], MD   Chief Complaint  Patient presents with  . Atrial Fibrillation   History of Present Illness: Nicholas [name] is an 81 y.o. male last seen in June. He presents for a routine follow-up visit. We reviewed his medications today which are unchanged from a cardiac perspective. He reports no major since of palpitations, chronic stable dyspnea on exertion.  He brought in a record of home blood pressure and heart rate checks over the last few weeks. We reviewed this, he has had adequate blood pressure control, and no heart rates faster than the 90s.  He remains on Coumadin, followed by [name]. Does not report any bleeding problems.  Echocardiogram from 2014 is outlined below. He had normal LVEF at that point, mildly dilated aortic root and ascending aorta.   Past Medical History  Diagnosis Date  . Obstructive sleep apnea   . COPD (chronic obstructive pulmonary disease) ([place])   . Osteoarthritis   . Atrial fibrillation ([place])   . Malignant neoplasm of prostate ([place])   . Essential hypertension, benign   . Esophageal reflux   . Cervical spondylosis without myelopathy   . Diverticulosis of colon (without mention of hemorrhage)     Current Outpatient Prescriptions  Medication Sig Dispense Refill  . acetaminophen (TYLENOL) 500 MG tablet Take 1,000 mg by mouth every 6 (six) hours as needed for moderate pain.    [name] albuterol-ipratropium (COMBIVENT) 18-103 MCG/ACT inhaler Inhale 2 puffs into the lungs every 6 (six) hours as needed (uses rarely). UAD PRN    . allopurinol (ZYLOPRIM) 300 MG tablet Take 300 mg by mouth daily.      . Cholecalciferol (VITAMIN D3) 1000 UNITS tablet Take 1,000 Units by mouth daily.      . citalopram (CELEXA) 10 MG tablet Take 10 mg by mouth daily.    . Cyanocobalamin (VITAMIN B-12 IJ) Inject as directed every  30 (thirty) days.    [name] esomeprazole (NEXIUM) 40 MG capsule Take 40 mg by mouth daily at 12 noon.    [name] HYDROcodone-acetaminophen (NORCO/VICODIN) 5-325 MG per tablet Take 1 tablet by mouth every 6 (six) hours as needed for moderate pain. 40 tablet 0  . hydroxypropyl methylcellulose / hypromellose (ISOPTO TEARS / GONIOVISC) 2.5 % ophthalmic solution Place 1 drop into both eyes daily as needed for dry eyes.    [name] loratadine (CLARITIN) 10 MG tablet Take 10 mg by mouth daily as needed.     . metoprolol tartrate (LOPRESSOR) 25 MG tablet TAKE 1 TABLET TWICE A DAY 180 tablet 3  . Multiple Vitamins-Minerals (PRESERVISION AREDS) CAPS Take 1 capsule by mouth 2 (two) times daily.     . ondansetron (ZOFRAN) 4 MG tablet Take 1 tablet (4 mg total) by mouth every 8 (eight) hours as needed for nausea or vomiting. 60 tablet 1  . simethicone (MYLICON) 0000000 MG chewable tablet Chew 125 mg by mouth every 6 (six) hours as needed for flatulence.    . warfarin (COUMADIN) 5 MG tablet Take 2.5-5 mg by mouth See admin instructions. Take 5 mg on Mondays . Take 2.5 mg all other days.  UAD - managed by [name]     No current facility-administered medications for this visit.   Allergies:  Sulfonamide derivatives   Social History: The patient  reports that he quit smoking about 49 years ago. His  smoking use included Cigarettes. He has a 18 pack-year smoking history. He quit smokeless tobacco use about 47 years ago. His smokeless tobacco use included Chew. He reports that he does not drink alcohol or use illicit drugs.   ROS:  Please see the history of present illness. Otherwise, complete review of systems is positive for chronic shortness of breath. Intentional weight loss by cutting back carbohydrates. All other systems are reviewed and negative.   Physical Exam: VS:  BP 130/82 mmHg  Pulse 84  Ht 5\' 9"  (1.753 m)  Wt 231 lb (104.781 kg)  BMI 34.10 kg/m2  SpO2 99%, BMI Body mass index is 34.1 kg/(m^2).  Wt Readings from  Last 3 Encounters:  06/03/15 231 lb (104.781 kg)  03/12/15 234 lb 11.2 oz (106.459 kg)  11/29/14 240 lb (108.863 kg)    Obese male in no acute distress.  HEENT: Conjunctiva and lids normal, oropharynx with moist mucosa.  Neck: Supple, no elevated JVP.  Lungs: Clear to auscultation, diminished, no wheezing.  Cardiac: Irregularly irregular, no S3 or rub.  Abdomen: Nontender, bowel sounds present,.  Extremities: No pitting edema. Distal pulses are 2+.  ECG: Tracing from 11/29/2014 showed slow atrial fibrillation with low voltage, R' in lead V1.  Recent Labwork: 06/27/2014: BUN 14; Creatinine, Ser 0.82; Potassium 4.4; Sodium 139 03/12/2015: ALT 6*; AST 19; Hemoglobin 13.5; Platelets 197  11/08/2014: Hgb 13.1, platelets 185, TSH 1.9, BUN 12, creatinine 0.89, potassium 5.9, AST 22, ALT 10 08/23/2014: cholesterol 165, HDL 59, LDL 93  Other Studies Reviewed Today:  Echocardiogram in February 2014 revealed LVEF 60-65%, mild aortic regurgitation, mildly dilated aortic root and ascending aorta, mild mitral regurgitation, moderate left atrial enlargement, moderately dilated right ventricle with normal function, moderate to severe right atrial enlargement without obvious PFO, mild tricuspid regurgitation, PASP 31 mm mercury.  Assessment and Plan:  1. Chronic atrial fibrillation. Heart rate control is to be adequate on present regimen. No change in Lopressor dose. Continue Coumadin with follow-up per [name].  2. Chronic dyspnea on exertion. Likely multifactorial. Follow-up echocardiogram will be obtained to reassess LVEF and valvular status.  3. Mild aortic root and ascending aorta dilatation. Follow-up echocardiogram will be obtained.  Current medicines were reviewed with the patient today.   Orders Placed This Encounter  Procedures  . ECHOCARDIOGRAM COMPLETE    Disposition: FU with me in 6 months.   Signed, Satira Sark, MD, [facility] 06/03/2015 9:21 AM    [facility] at [place], [place],  [address] Phone: [phone]; Fax: [phone]

## 2015-06-03 NOTE — Patient Instructions (Signed)
Your physician has requested that you have an echocardiogram. Echocardiography is a painless test that uses sound waves to create images of your heart. It provides your doctor with information about the size and shape of your heart and how well your heart's chambers and valves are working. This procedure takes approximately one hour. There are no restrictions for this procedure - DUE February 2017.   Office will contact with results via phone or letter.   Continue all current medications. Your physician wants you to follow up in: 6 months.  You will receive a reminder letter in the mail one-two months in advance.  If you don't receive a letter, please call our office to schedule the follow up appointment

## 2015-06-10 ENCOUNTER — Ambulatory Visit (INDEPENDENT_AMBULATORY_CARE_PROVIDER_SITE_OTHER): Payer: Medicare Other | Admitting: Urology

## 2015-06-10 DIAGNOSIS — R3912 Poor urinary stream: Secondary | ICD-10-CM | POA: Diagnosis not present

## 2015-06-10 DIAGNOSIS — C61 Malignant neoplasm of prostate: Secondary | ICD-10-CM | POA: Diagnosis not present

## 2015-06-10 DIAGNOSIS — N5201 Erectile dysfunction due to arterial insufficiency: Secondary | ICD-10-CM

## 2015-06-11 ENCOUNTER — Ambulatory Visit (INDEPENDENT_AMBULATORY_CARE_PROVIDER_SITE_OTHER): Payer: Medicare Other | Admitting: Internal Medicine

## 2015-06-11 ENCOUNTER — Encounter (INDEPENDENT_AMBULATORY_CARE_PROVIDER_SITE_OTHER): Payer: Self-pay

## 2015-06-11 ENCOUNTER — Encounter (INDEPENDENT_AMBULATORY_CARE_PROVIDER_SITE_OTHER): Payer: Self-pay | Admitting: Internal Medicine

## 2015-06-11 VITALS — BP 118/84 | HR 72 | Temp 98.0°F | Ht 69.0 in | Wt 227.9 lb

## 2015-06-11 DIAGNOSIS — R110 Nausea: Secondary | ICD-10-CM

## 2015-06-11 MED ORDER — ONDANSETRON HCL 4 MG PO TABS: 4.0000 mg | ORAL_TABLET | Freq: Three times a day (TID) | ORAL | Status: DC | PRN

## 2015-06-11 NOTE — Progress Notes (Addendum)
Subjective:    Patient ID: Nicholas [name], male    DOB: 09-14-1928, 81 y.o.   MRN: [id number]  HPI Here today for f/u. He was last seen in September with c/o nausea and bloating and dysphagia. He tells me he has some nausea. In the morning he has some nausea. He says he cannot vomit.  He has lost about 6 pounds since his last visit in September. He says he a lot of flatus. He has stopped eating a carton a day. He has not had any ice cream in 2 months. He has had nausea every since his GB surgery in January.  Appetite is okay.  Sometimes chicken will be slow to go down.  He says sometimes his stools are loose. He says for the most part, his stools are normal.  He takes Imodium as needed. He underwent an esophagram which revealed FINDINGS: No mass or stricture is noted in the esophagus. No reflux or hiatal hernia is noted. Mild tertiary contractions are noted in the in distal esophagus. Barium tablet passed without significant delay or difficulty through esophagus into stomach.  IMPRESSION: Mild tertiary contractions are noted in the distal esophagus consistent with presbyesophagus. No other significant abnormality seen in the esophagus.   05/30/2015 H and h 14.0 AND 42.9, mcv 101, TOTAL BILI 0.6, ALP 73, AST 21, ALT 7, TSH 0.902 CBC    Component Value Date/Time   WBC 5.7 03/12/2015 1034   RBC 4.06* 03/12/2015 1034   HGB 13.5 03/12/2015 1034   HCT 40.9 03/12/2015 1034   PLT 197 03/12/2015 1034   MCV 100.7* 03/12/2015 1034   MCH 33.3 03/12/2015 1034   MCHC 33.0 03/12/2015 1034   RDW 15.1 03/12/2015 1034   LYMPHSABS 1.7 03/12/2015 1034   MONOABS 0.5 03/12/2015 1034   EOSABS 0.2 03/12/2015 1034   BASOSABS 0.0 03/12/2015 1034    Hepatic Function Panel     Component Value Date/Time   PROT 6.1 03/12/2015 1034   ALBUMIN 3.8 03/12/2015 1034   AST 19 03/12/2015 1034   ALT 6* 03/12/2015 1034   ALKPHOS 68 03/12/2015 1034   BILITOT 0.6 03/12/2015 1034   BILIDIR 0.1 03/12/2015  1034   IBILI 0.5 03/12/2015 1034           .   Colonoscopy in 2013 by Dr. Truddie Hidden. Revealed no colonic neoplasms. 5 polyps.  Biopsy: Tubular adenoma.  Review of Systems Past Medical History  Diagnosis Date  . Obstructive sleep apnea   . COPD (chronic obstructive pulmonary disease) ([place])   . Osteoarthritis   . Atrial fibrillation ([place])   . Malignant neoplasm of prostate ([place])   . Essential hypertension, benign   . Esophageal reflux   . Cervical spondylosis without myelopathy   . Diverticulosis of colon (without mention of hemorrhage)     Past Surgical History  Procedure Laterality Date  . Total knee arthroplasty      2000, 2005.   [name] Rotator cuff repair    . Esophagogastroduodenoscopy (egd) with esophageal dilation N/A 06/18/2013    Procedure: ESOPHAGOGASTRODUODENOSCOPY (EGD) WITH ESOPHAGEAL DILATION;  Surgeon: [name], MD;  [facility]: [facility];  Service: Endoscopy;  Laterality: N/A;  220-rescheduled to 730 Ann to notify pt  . Colonoscopy    . Cholecystectomy N/A 07/03/2014    Procedure: LAPAROSCOPIC CHOLECYSTECTOMY;  Surgeon: [name], MD;  Location: [facility] ORS;  Service: General;  Laterality: N/A;    Allergies  Allergen Reactions  . Sulfonamide Derivatives  REACTION: rash    Current Outpatient Prescriptions on File Prior to Visit  Medication Sig Dispense Refill  . acetaminophen (TYLENOL) 500 MG tablet Take 1,000 mg by mouth every 6 (six) hours as needed for moderate pain.    [name] albuterol-ipratropium (COMBIVENT) 18-103 MCG/ACT inhaler Inhale 2 puffs into the lungs every 6 (six) hours as needed (uses rarely). UAD PRN    . allopurinol (ZYLOPRIM) 300 MG tablet Take 300 mg by mouth daily.      . Cholecalciferol (VITAMIN D3) 1000 UNITS tablet Take 1,000 Units by mouth daily.      . citalopram (CELEXA) 10 MG tablet Take 10 mg by mouth daily.    . Cyanocobalamin (VITAMIN B-12 IJ) Inject as directed every 30 (thirty) days.    [name] esomeprazole (NEXIUM) 40  MG capsule Take 40 mg by mouth daily at 12 noon.    [name] HYDROcodone-acetaminophen (NORCO/VICODIN) 5-325 MG per tablet Take 1 tablet by mouth every 6 (six) hours as needed for moderate pain. 40 tablet 0  . hydroxypropyl methylcellulose / hypromellose (ISOPTO TEARS / GONIOVISC) 2.5 % ophthalmic solution Place 1 drop into both eyes daily as needed for dry eyes.    [name] loratadine (CLARITIN) 10 MG tablet Take 10 mg by mouth daily as needed.     . metoprolol tartrate (LOPRESSOR) 25 MG tablet TAKE 1 TABLET TWICE A DAY 180 tablet 3  . Multiple Vitamins-Minerals (PRESERVISION AREDS) CAPS Take 1 capsule by mouth 2 (two) times daily.     . ondansetron (ZOFRAN) 4 MG tablet Take 1 tablet (4 mg total) by mouth every 8 (eight) hours as needed for nausea or vomiting. 60 tablet 1  . simethicone (MYLICON) 0000000 MG chewable tablet Chew 125 mg by mouth every 6 (six) hours as needed for flatulence.    . warfarin (COUMADIN) 5 MG tablet Take 2.5-5 mg by mouth See admin instructions. Take 5 mg on Mondays . Take 2.5 mg all other days.  UAD - managed by [name]     No current facility-administered medications on file prior to visit.        Objective:   Physical Exam Blood pressure 118/84, pulse 72, temperature 98 F (36.7 C), height 5\' 9"  (1.753 m), weight 227 lb 14.4 oz (103.375 kg).  Alert and oriented. Skin warm and dry. Oral mucosa is moist.   . Sclera anicteric, conjunctivae is pink. Thyroid not enlarged. No cervical lymphadenopathy. Lungs clear. Heart regular rate and rhythm.  Abdomen is soft. Bowel sounds are positive. No hepatomegaly. No abdominal masses felt. No tenderness.  No edema to lower extremities.         Assessment & Plan:  [place] sporadically especially in the morning. CBC and Hepatic function normal in September. OV in 6 months.

## 2015-06-11 NOTE — Patient Instructions (Signed)
Continue the Nexium.  OV in 6 months

## 2015-07-01 DIAGNOSIS — F329 Major depressive disorder, single episode, unspecified: Secondary | ICD-10-CM | POA: Diagnosis not present

## 2015-07-01 DIAGNOSIS — E538 Deficiency of other specified B group vitamins: Secondary | ICD-10-CM | POA: Diagnosis not present

## 2015-07-01 DIAGNOSIS — I10 Essential (primary) hypertension: Secondary | ICD-10-CM | POA: Diagnosis not present

## 2015-07-01 DIAGNOSIS — I482 Chronic atrial fibrillation: Secondary | ICD-10-CM | POA: Diagnosis not present

## 2015-07-30 DIAGNOSIS — I4891 Unspecified atrial fibrillation: Secondary | ICD-10-CM | POA: Diagnosis not present

## 2015-07-30 DIAGNOSIS — E538 Deficiency of other specified B group vitamins: Secondary | ICD-10-CM | POA: Diagnosis not present

## 2015-07-30 DIAGNOSIS — M549 Dorsalgia, unspecified: Secondary | ICD-10-CM | POA: Diagnosis not present

## 2015-07-30 DIAGNOSIS — F329 Major depressive disorder, single episode, unspecified: Secondary | ICD-10-CM | POA: Diagnosis not present

## 2015-08-06 ENCOUNTER — Other Ambulatory Visit: Payer: Self-pay

## 2015-08-06 ENCOUNTER — Ambulatory Visit (INDEPENDENT_AMBULATORY_CARE_PROVIDER_SITE_OTHER): Payer: Medicare Other

## 2015-08-06 DIAGNOSIS — I482 Chronic atrial fibrillation, unspecified: Secondary | ICD-10-CM

## 2015-08-07 ENCOUNTER — Telehealth: Payer: Self-pay | Admitting: *Deleted

## 2015-08-07 NOTE — Telephone Encounter (Signed)
-----   Message from Satira Sark, MD sent at 08/06/2015  5:36 PM EST ----- Reviewed. No substantial changes compared to the prior study. He has had no decrease in LVEF and the aortic root is only mildly ectatic.

## 2015-08-08 NOTE — Telephone Encounter (Signed)
Patient returned call

## 2015-08-08 NOTE — Telephone Encounter (Signed)
Patient had to leave home please call back after 4pm

## 2015-08-11 NOTE — Telephone Encounter (Signed)
Pt stopped by office and made aware of results.

## 2015-08-20 DIAGNOSIS — L570 Actinic keratosis: Secondary | ICD-10-CM | POA: Diagnosis not present

## 2015-08-20 DIAGNOSIS — Z85828 Personal history of other malignant neoplasm of skin: Secondary | ICD-10-CM | POA: Diagnosis not present

## 2015-08-27 DIAGNOSIS — E538 Deficiency of other specified B group vitamins: Secondary | ICD-10-CM | POA: Diagnosis not present

## 2015-08-27 DIAGNOSIS — Z6836 Body mass index (BMI) 36.0-36.9, adult: Secondary | ICD-10-CM | POA: Diagnosis not present

## 2015-08-27 DIAGNOSIS — I4891 Unspecified atrial fibrillation: Secondary | ICD-10-CM | POA: Diagnosis not present

## 2015-08-27 DIAGNOSIS — Z87891 Personal history of nicotine dependence: Secondary | ICD-10-CM | POA: Diagnosis not present

## 2015-09-24 DIAGNOSIS — Z1211 Encounter for screening for malignant neoplasm of colon: Secondary | ICD-10-CM | POA: Diagnosis not present

## 2015-09-24 DIAGNOSIS — J984 Other disorders of lung: Secondary | ICD-10-CM | POA: Diagnosis not present

## 2015-09-24 DIAGNOSIS — Z418 Encounter for other procedures for purposes other than remedying health state: Secondary | ICD-10-CM | POA: Diagnosis not present

## 2015-09-24 DIAGNOSIS — F329 Major depressive disorder, single episode, unspecified: Secondary | ICD-10-CM | POA: Diagnosis not present

## 2015-09-24 DIAGNOSIS — B351 Tinea unguium: Secondary | ICD-10-CM | POA: Diagnosis not present

## 2015-09-24 DIAGNOSIS — L110 Acquired keratosis follicularis: Secondary | ICD-10-CM | POA: Diagnosis not present

## 2015-09-24 DIAGNOSIS — Z1389 Encounter for screening for other disorder: Secondary | ICD-10-CM | POA: Diagnosis not present

## 2015-09-24 DIAGNOSIS — Z0000 Encounter for general adult medical examination without abnormal findings: Secondary | ICD-10-CM | POA: Diagnosis not present

## 2015-09-24 DIAGNOSIS — Z6836 Body mass index (BMI) 36.0-36.9, adult: Secondary | ICD-10-CM | POA: Diagnosis not present

## 2015-09-24 DIAGNOSIS — Z7189 Other specified counseling: Secondary | ICD-10-CM | POA: Diagnosis not present

## 2015-09-24 DIAGNOSIS — M2041 Other hammer toe(s) (acquired), right foot: Secondary | ICD-10-CM | POA: Diagnosis not present

## 2015-09-24 DIAGNOSIS — M2042 Other hammer toe(s) (acquired), left foot: Secondary | ICD-10-CM | POA: Diagnosis not present

## 2015-09-24 DIAGNOSIS — I482 Chronic atrial fibrillation: Secondary | ICD-10-CM | POA: Diagnosis not present

## 2015-09-24 DIAGNOSIS — R079 Chest pain, unspecified: Secondary | ICD-10-CM | POA: Diagnosis not present

## 2015-09-24 DIAGNOSIS — I517 Cardiomegaly: Secondary | ICD-10-CM | POA: Diagnosis not present

## 2015-09-30 DIAGNOSIS — Z125 Encounter for screening for malignant neoplasm of prostate: Secondary | ICD-10-CM | POA: Diagnosis not present

## 2015-09-30 DIAGNOSIS — Z79899 Other long term (current) drug therapy: Secondary | ICD-10-CM | POA: Diagnosis not present

## 2015-09-30 DIAGNOSIS — I10 Essential (primary) hypertension: Secondary | ICD-10-CM | POA: Diagnosis not present

## 2015-09-30 DIAGNOSIS — R5383 Other fatigue: Secondary | ICD-10-CM | POA: Diagnosis not present

## 2015-10-22 DIAGNOSIS — E538 Deficiency of other specified B group vitamins: Secondary | ICD-10-CM | POA: Diagnosis not present

## 2015-10-22 DIAGNOSIS — I482 Chronic atrial fibrillation: Secondary | ICD-10-CM | POA: Diagnosis not present

## 2015-10-22 DIAGNOSIS — K921 Melena: Secondary | ICD-10-CM | POA: Diagnosis not present

## 2015-10-22 DIAGNOSIS — F329 Major depressive disorder, single episode, unspecified: Secondary | ICD-10-CM | POA: Diagnosis not present

## 2015-10-22 DIAGNOSIS — R739 Hyperglycemia, unspecified: Secondary | ICD-10-CM | POA: Diagnosis not present

## 2015-10-23 ENCOUNTER — Encounter (INDEPENDENT_AMBULATORY_CARE_PROVIDER_SITE_OTHER): Payer: Self-pay | Admitting: Internal Medicine

## 2015-10-23 ENCOUNTER — Ambulatory Visit (INDEPENDENT_AMBULATORY_CARE_PROVIDER_SITE_OTHER): Payer: Medicare Other | Admitting: Internal Medicine

## 2015-10-23 ENCOUNTER — Other Ambulatory Visit (INDEPENDENT_AMBULATORY_CARE_PROVIDER_SITE_OTHER): Payer: Self-pay | Admitting: *Deleted

## 2015-10-23 ENCOUNTER — Other Ambulatory Visit (INDEPENDENT_AMBULATORY_CARE_PROVIDER_SITE_OTHER): Payer: Self-pay | Admitting: Internal Medicine

## 2015-10-23 ENCOUNTER — Encounter (INDEPENDENT_AMBULATORY_CARE_PROVIDER_SITE_OTHER): Payer: Self-pay | Admitting: *Deleted

## 2015-10-23 VITALS — BP 140/90 | HR 72 | Temp 98.6°F | Ht 69.0 in | Wt 234.6 lb

## 2015-10-23 DIAGNOSIS — R195 Other fecal abnormalities: Secondary | ICD-10-CM

## 2015-10-23 DIAGNOSIS — Z860100 Personal history of colon polyps, unspecified: Secondary | ICD-10-CM

## 2015-10-23 DIAGNOSIS — Z800 Family history of malignant neoplasm of digestive organs: Secondary | ICD-10-CM | POA: Diagnosis not present

## 2015-10-23 NOTE — Telephone Encounter (Signed)
Needs trilyte 

## 2015-10-23 NOTE — Patient Instructions (Signed)
Colonoscopy.  The risks and benefits such as perforation, bleeding, and infection were reviewed with the patient and is agreeable. 

## 2015-10-23 NOTE — Progress Notes (Signed)
Subjective:    Patient ID: Nicholas [name], male    DOB: [birth year], 82 y.o.   MRN: [id number]  HPI  Presents today with c/o that he saw [name] yesterday. He had a positive stool card. He occasionally sees blood when he has a hard BM. His appetite is good. No weight loss. He does have a lot of gas and occasionally heartburn. Occasionally has some trouble swallowing when he eats dry chicken.  He has a hx of multiple polyps and is due for a surveillance colonoscopy in July.  Hx of atrial fib and maintained on Warfarin.  10/01/2015 H and H 12.6 and 39.4  Colonoscopy in 2013 by Dr. Truddie Hidden. Revealed no colonic neoplasms. 5 polyps.  Biopsy: Tubular adenoma. One was tubulovillous adenoma.    Family hx of colon cancer in a sister age died in her 92s.  02/03/13 Colonoscopy: Dr. Durenda Hurt colonic or rectal polyps. Pandiverticulosis. Redundant colon.   09/04/2008 Colonoscopy Dr Lindalou Hose: Family hx of colon cancer. Personal hx of colonic polyps. 39mm sessile polyp in the sigmoid colon. 92mm four polyps in the ascending colon. 86mm sessile polyp in the cecum. Repeat in 2 yrs. Biopsy: Tubular adenoma.  Review of Systems   Review of Systems Past Medical History  Diagnosis Date  . Obstructive sleep apnea   . COPD (chronic obstructive pulmonary disease) ([place])   . Osteoarthritis   . Atrial fibrillation ([place])   . Malignant neoplasm of prostate ([place])   . Essential hypertension, benign   . Esophageal reflux   . Cervical spondylosis without myelopathy   . Diverticulosis of colon (without mention of hemorrhage)     Past Surgical History  Procedure Laterality Date  . Total knee arthroplasty      2000, 2005.   [name] Rotator cuff repair    . Esophagogastroduodenoscopy (egd) with esophageal dilation N/A 06/18/2013    Procedure: ESOPHAGOGASTRODUODENOSCOPY (EGD) WITH ESOPHAGEAL DILATION;  Surgeon: [name], MD;  [facility]: [facility];  Service: Endoscopy;  Laterality: N/A;  220-rescheduled to  730 Ann to notify pt  . Colonoscopy    . Cholecystectomy N/A 07/03/2014    Procedure: LAPAROSCOPIC CHOLECYSTECTOMY;  Surgeon: [name], MD;  Location: [facility];  Service: General;  Laterality: N/A;    Allergies  Allergen Reactions  . Sulfonamide Derivatives     REACTION: rash    Current Outpatient Prescriptions on File Prior to Visit  Medication Sig Dispense Refill  . acetaminophen (TYLENOL) 500 MG tablet Take 1,000 mg by mouth every 6 (six) hours as needed for moderate pain.    [name] albuterol-ipratropium (COMBIVENT) 18-103 MCG/ACT inhaler Inhale 2 puffs into the lungs every 6 (six) hours as needed (uses rarely). UAD PRN    . allopurinol (ZYLOPRIM) 300 MG tablet Take 300 mg by mouth daily.      . Cholecalciferol (VITAMIN D3) 1000 UNITS tablet Take 1,000 Units by mouth daily.      . Cyanocobalamin (VITAMIN B-12 IJ) Inject as directed every 30 (thirty) days.    [name] esomeprazole (NEXIUM) 40 MG capsule Take 40 mg by mouth daily. 7am    . hydroxypropyl methylcellulose / hypromellose (ISOPTO TEARS / GONIOVISC) 2.5 % ophthalmic solution Place 1 drop into both eyes daily as needed for dry eyes.    [name] loratadine (CLARITIN) 10 MG tablet Take 10 mg by mouth daily as needed.     . metoprolol tartrate (LOPRESSOR) 25 MG tablet TAKE 1 TABLET TWICE A DAY 180 tablet 3  . Multiple Vitamins-Minerals (  PRESERVISION AREDS) CAPS Take 1 capsule by mouth 2 (two) times daily.     . ondansetron (ZOFRAN) 4 MG tablet Take 1 tablet (4 mg total) by mouth every 8 (eight) hours as needed for nausea or vomiting. 60 tablet 1  . simethicone (MYLICON) 0000000 MG chewable tablet Chew 125 mg by mouth every 6 (six) hours as needed for flatulence.    . warfarin (COUMADIN) 5 MG tablet Take 2.5-5 mg by mouth See admin instructions. Take 5 mg on Mondays . Take 2.5 mg all other days.  UAD - managed by [name]     No current facility-administered medications on file prior to visit.        Objective:   Physical Exam Blood pressure  140/90, pulse 72, temperature 98.6 F (37 C), height 5\' 9"  (1.753 m), weight 234 lb 9.6 oz (106.414 kg).]  Alert and oriented. Skin warm and dry. Oral mucosa is moist.   . Sclera anicteric, conjunctivae is pink. Thyroid not enlarged. No cervical lymphadenopathy. Lungs clear. Heart regular rate and rhythm.  Abdomen is soft. Bowel sounds are positive. No hepatomegaly. No abdominal masses felt. No tenderness.  No edema to lower extremities.         Assessment & Plan:  Guaiac positive stool. Hx of colon polyps. Family hx of colon cancer. Colonoscopy. The risks and benefits such as perforation, bleeding, and infection were reviewed with the patient and is agreeable.

## 2015-10-24 ENCOUNTER — Encounter (INDEPENDENT_AMBULATORY_CARE_PROVIDER_SITE_OTHER): Payer: Self-pay | Admitting: *Deleted

## 2015-10-24 ENCOUNTER — Telehealth (INDEPENDENT_AMBULATORY_CARE_PROVIDER_SITE_OTHER): Payer: Self-pay | Admitting: *Deleted

## 2015-10-24 MED ORDER — PEG 3350-KCL-NA BICARB-NACL 420 G PO SOLR: 4000.0000 mL | Freq: Once | ORAL | Status: DC

## 2015-10-24 NOTE — Telephone Encounter (Signed)
Spoke to [place] @ [facility], per Dr Manuella Ghazi it is ok for patient to stop warfarin 5 days prior to TCS, patient aware

## 2015-11-21 DIAGNOSIS — K219 Gastro-esophageal reflux disease without esophagitis: Secondary | ICD-10-CM | POA: Diagnosis not present

## 2015-11-21 DIAGNOSIS — J069 Acute upper respiratory infection, unspecified: Secondary | ICD-10-CM | POA: Diagnosis not present

## 2015-11-21 DIAGNOSIS — E538 Deficiency of other specified B group vitamins: Secondary | ICD-10-CM | POA: Diagnosis not present

## 2015-11-21 DIAGNOSIS — I4891 Unspecified atrial fibrillation: Secondary | ICD-10-CM | POA: Diagnosis not present

## 2015-12-03 ENCOUNTER — Encounter (INDEPENDENT_AMBULATORY_CARE_PROVIDER_SITE_OTHER): Payer: Self-pay | Admitting: *Deleted

## 2015-12-04 ENCOUNTER — Encounter: Payer: Self-pay | Admitting: *Deleted

## 2015-12-04 ENCOUNTER — Ambulatory Visit (INDEPENDENT_AMBULATORY_CARE_PROVIDER_SITE_OTHER): Payer: Medicare Other | Admitting: Cardiology

## 2015-12-04 ENCOUNTER — Encounter: Payer: Self-pay | Admitting: Cardiology

## 2015-12-04 VITALS — BP 118/78 | HR 69 | Ht 69.0 in | Wt 232.2 lb

## 2015-12-04 DIAGNOSIS — I482 Chronic atrial fibrillation, unspecified: Secondary | ICD-10-CM

## 2015-12-04 DIAGNOSIS — I4891 Unspecified atrial fibrillation: Secondary | ICD-10-CM | POA: Diagnosis not present

## 2015-12-04 DIAGNOSIS — I10 Essential (primary) hypertension: Secondary | ICD-10-CM

## 2015-12-04 NOTE — Progress Notes (Signed)
Cardiology Office Note  Date: 12/04/2015   ID: Nicholas [name], DOB 1929/03/30, MRN [id number]  PCP: [name], MD  Primary Cardiologist: [name], MD   Chief Complaint  Patient presents with  . Atrial Fibrillation    History of Present Illness: Nicholas [name] is an 82 y.o. male last seen in December 2016. He presents for a routine follow-up visit. Reports chronic fatigue as before, seems to feel better later in the evening. He does not endorse any exertional chest pain or palpitations.  He continues on Coumadin with follow up per [name]. No reported bleeding problems.  Updated echocardiogram from February of this year is outlined below. I reviewed his ECG today which showed rate-controlled atrial fibrillation with low voltage.  I reviewed his most recent lab work from April.  Past Medical History  Diagnosis Date  . Obstructive sleep apnea   . COPD (chronic obstructive pulmonary disease) ([place])   . Osteoarthritis   . Atrial fibrillation ([place])   . Malignant neoplasm of prostate ([place])   . Essential hypertension, benign   . Esophageal reflux   . Cervical spondylosis without myelopathy   . Diverticulosis of colon (without mention of hemorrhage)     Past Surgical History  Procedure Laterality Date  . Total knee arthroplasty      2000, 2005.   [name] Rotator cuff repair    . Esophagogastroduodenoscopy (egd) with esophageal dilation N/A 06/18/2013    Procedure: ESOPHAGOGASTRODUODENOSCOPY (EGD) WITH ESOPHAGEAL DILATION;  Surgeon: [name], MD;  [facility];  Service: Endoscopy;  Laterality: N/A;  220-rescheduled to 730 Ann to notify pt  . Colonoscopy    . Cholecystectomy N/A 07/03/2014    Procedure: LAPAROSCOPIC CHOLECYSTECTOMY;  Surgeon: [name], MD;  Location: [facility];  Service: General;  Laterality: N/A;    Current Outpatient Prescriptions  Medication Sig Dispense Refill  . albuterol-ipratropium (COMBIVENT) 18-103 MCG/ACT inhaler Inhale 2 puffs  into the lungs every 6 (six) hours as needed (uses rarely). UAD PRN    . allopurinol (ZYLOPRIM) 300 MG tablet Take 300 mg by mouth daily.      . Cholecalciferol (VITAMIN D3) 1000 UNITS tablet Take 1,000 Units by mouth daily.      . Cyanocobalamin (VITAMIN B-12 IJ) Inject as directed every 30 (thirty) days.    [name] esomeprazole (NEXIUM) 40 MG capsule Take 40 mg by mouth daily. 7am    . HYDROcodone-acetaminophen (NORCO/VICODIN) 5-325 MG tablet Take 1 tablet by mouth every 6 (six) hours as needed for moderate pain.    . hydroxypropyl methylcellulose / hypromellose (ISOPTO TEARS / GONIOVISC) 2.5 % ophthalmic solution Place 1 drop into both eyes daily as needed for dry eyes.    [name] loratadine (CLARITIN) 10 MG tablet Take 10 mg by mouth daily as needed.     . metoprolol tartrate (LOPRESSOR) 25 MG tablet TAKE 1 TABLET TWICE A DAY 180 tablet 3  . Multiple Vitamins-Minerals (PRESERVISION AREDS) CAPS Take 1 capsule by mouth 2 (two) times daily.     . ondansetron (ZOFRAN) 4 MG tablet Take 1 tablet (4 mg total) by mouth every 8 (eight) hours as needed for nausea or vomiting. 60 tablet 1  . polyethylene glycol-electrolytes (NULYTELY/GOLYTELY) 420 g solution Take 4,000 mLs by mouth once. 4000 mL 0  . simethicone (MYLICON) 0000000 MG chewable tablet Chew 125 mg by mouth every 6 (six) hours as needed for flatulence.    . warfarin (COUMADIN) 5 MG tablet Take 2.5-5 mg by mouth  See admin instructions. Take 5 mg on Mondays . Take 2.5 mg all other days.  UAD - managed by [name]     No current facility-administered medications for this visit.   Allergies:  Sulfonamide derivatives   Social History: The patient  reports that he quit smoking about 50 years ago. His smoking use included Cigarettes. He has a 18 pack-year smoking history. He quit smokeless tobacco use about 48 years ago. His smokeless tobacco use included Chew. He reports that he does not drink alcohol or use illicit drugs.   ROS:  Please see the history of  present illness. Otherwise, complete review of systems is positive for arthritic stiffness and shoulder pains.  All other systems are reviewed and negative.   Physical Exam: VS:  BP 118/78 mmHg  Pulse 69  Ht 5\' 9"  (1.753 m)  Wt 232 lb 3.2 oz (105.325 kg)  BMI 34.27 kg/m2  SpO2 98%, BMI Body mass index is 34.27 kg/(m^2).  Wt Readings from Last 3 Encounters:  12/04/15 232 lb 3.2 oz (105.325 kg)  10/23/15 234 lb 9.6 oz (106.414 kg)  06/11/15 227 lb 14.4 oz (103.375 kg)    Obese male in no acute distress.  HEENT: Conjunctiva and lids normal, oropharynx with moist mucosa.  Neck: Supple, no elevated JVP.  Lungs: Clear to auscultation, diminished, no wheezing.  Cardiac: Irregularly irregular, no S3 or rub.  Abdomen: Nontender, bowel sounds present,.  Extremities: No pitting edema. Distal pulses are 2+.  ECG: I personally reviewed the prior tracing from 11/29/2014 which showed atrial fibrillation with rightward axis and low voltage, R' in lead V1.  Recent Labwork: 03/12/2015: ALT 6*; AST 19; Hemoglobin 13.5; Platelets 197  December 2006: Hemoglobin 14.0, platelets 199, BUN 11, creatinine 0.9, potassium 4.5, AST 21, ALT 7, TSH 0.90 April 2017: BUN 12, creatinine 0.9, potassium 4.8, AST 15, ALT 7, hemoglobin 12.6, platelets 183 , TSH 2.4, cholesterol 127, triglycerides 58, HDL 55, LDL 60   Other Studies Reviewed Today:  Echocardiogram 08/06/2015: Study Conclusions  - Left ventricle: The cavity size was normal. Wall thickness was  increased in a pattern of mild LVH. Systolic function was normal.  The estimated ejection fraction was in the range of 60% to 65%.  Wall motion was normal; there were no regional wall motion  abnormalities. The study is not technically sufficient to allow  evaluation of LV diastolic function. - Aortic valve: There was trivial regurgitation. - Aortic root: The aortic root was mildly ectatic. - Mitral valve: Calcified annulus. There was trivial  regurgitation. - Left atrium: The atrium was moderately to severely dilated. - Right ventricle: The cavity size was mildly to moderately  dilated. Systolic function was normal. - Right atrium: The atrium was moderately to severely dilated.  Central venous pressure (est): 3 mm Hg. - Atrial septum: No defect or patent foramen ovale was identified. - Tricuspid valve: There was trivial regurgitation. - Pulmonary arteries: PA peak pressure: 17 mm Hg (S). - Pericardium, extracardiac: There was no pericardial effusion.  Impressions:  - Mild LVH with LVEF 60-65%. Indeterminate diastolic function.  Moderate to severe biatrial enlargement. MAC with trivial mitral  regurgitation. Mildly ectatic aortic root. Sclerotic aortic valve  with trivial aortic regurgitation. Mildly to moderately dilated  RV with normal contraction. Trivial tricuspid regurgitation with  PASP estimated 17 mmHg.  Assessment and Plan:  1. Chronic atrial fibrillation. Continue strategy of heart rate control and anticoagulation. Coumadin is followed by [name].  2. Essential hypertension, blood pressure  is well controlled today.  Current medicines were reviewed with the patient today.   Orders Placed This Encounter  Procedures  . EKG 12-Lead    Disposition: FU with me in 6 months.   Signed, Satira Sark, MD, [facility] 12/04/2015 11:10 AM    [place] [facility] [place], [place], [place] [address] Phone: [phone]; Fax: [phone]

## 2015-12-04 NOTE — Patient Instructions (Signed)
Your physician recommends that you continue on your current medications as directed. Please refer to the Current Medication list given to you today. Your physician recommends that you schedule a follow-up appointment in: 6 months. You will receive a reminder letter in the mail in about 4 months reminding you to call and schedule your appointment. If you don't receive this letter, please contact our office. 

## 2015-12-10 ENCOUNTER — Ambulatory Visit (INDEPENDENT_AMBULATORY_CARE_PROVIDER_SITE_OTHER): Payer: Medicare Other | Admitting: Internal Medicine

## 2015-12-18 ENCOUNTER — Ambulatory Visit (HOSPITAL_COMMUNITY)
Admission: RE | Admit: 2015-12-18 | Discharge: 2015-12-18 | Disposition: A | Discharge status: Home / Self Care | Payer: Medicare Other | Source: Ambulatory Visit | Attending: Internal Medicine | Admitting: Internal Medicine

## 2015-12-18 ENCOUNTER — Encounter (HOSPITAL_COMMUNITY): Payer: Self-pay | Admitting: *Deleted

## 2015-12-18 ENCOUNTER — Encounter (HOSPITAL_COMMUNITY)
Admission: RE | Disposition: A | Discharge status: Home / Self Care | Payer: Self-pay | Source: Ambulatory Visit | Attending: Internal Medicine

## 2015-12-18 DIAGNOSIS — D123 Benign neoplasm of transverse colon: Secondary | ICD-10-CM | POA: Diagnosis not present

## 2015-12-18 DIAGNOSIS — K5730 Diverticulosis of large intestine without perforation or abscess without bleeding: Secondary | ICD-10-CM | POA: Diagnosis not present

## 2015-12-18 DIAGNOSIS — R195 Other fecal abnormalities: Secondary | ICD-10-CM | POA: Diagnosis not present

## 2015-12-18 DIAGNOSIS — Z800 Family history of malignant neoplasm of digestive organs: Secondary | ICD-10-CM

## 2015-12-18 DIAGNOSIS — K5520 Angiodysplasia of colon without hemorrhage: Secondary | ICD-10-CM | POA: Diagnosis not present

## 2015-12-18 DIAGNOSIS — I10 Essential (primary) hypertension: Secondary | ICD-10-CM | POA: Diagnosis not present

## 2015-12-18 DIAGNOSIS — Z9049 Acquired absence of other specified parts of digestive tract: Secondary | ICD-10-CM | POA: Insufficient documentation

## 2015-12-18 DIAGNOSIS — Z79899 Other long term (current) drug therapy: Secondary | ICD-10-CM | POA: Diagnosis not present

## 2015-12-18 DIAGNOSIS — Z860100 Personal history of colon polyps, unspecified: Secondary | ICD-10-CM

## 2015-12-18 DIAGNOSIS — D122 Benign neoplasm of ascending colon: Secondary | ICD-10-CM | POA: Insufficient documentation

## 2015-12-18 DIAGNOSIS — K644 Residual hemorrhoidal skin tags: Secondary | ICD-10-CM | POA: Diagnosis not present

## 2015-12-18 DIAGNOSIS — J449 Chronic obstructive pulmonary disease, unspecified: Secondary | ICD-10-CM | POA: Insufficient documentation

## 2015-12-18 DIAGNOSIS — I4891 Unspecified atrial fibrillation: Secondary | ICD-10-CM | POA: Insufficient documentation

## 2015-12-18 DIAGNOSIS — K627 Radiation proctitis: Secondary | ICD-10-CM | POA: Diagnosis not present

## 2015-12-18 DIAGNOSIS — Z7901 Long term (current) use of anticoagulants: Secondary | ICD-10-CM | POA: Insufficient documentation

## 2015-12-18 DIAGNOSIS — Z882 Allergy status to sulfonamides status: Secondary | ICD-10-CM | POA: Insufficient documentation

## 2015-12-18 DIAGNOSIS — K5750 Diverticulosis of both small and large intestine without perforation or abscess without bleeding: Secondary | ICD-10-CM | POA: Diagnosis not present

## 2015-12-18 DIAGNOSIS — M1990 Unspecified osteoarthritis, unspecified site: Secondary | ICD-10-CM | POA: Diagnosis not present

## 2015-12-18 DIAGNOSIS — K921 Melena: Secondary | ICD-10-CM

## 2015-12-18 DIAGNOSIS — G4733 Obstructive sleep apnea (adult) (pediatric): Secondary | ICD-10-CM | POA: Diagnosis not present

## 2015-12-18 DIAGNOSIS — Z8546 Personal history of malignant neoplasm of prostate: Secondary | ICD-10-CM | POA: Diagnosis not present

## 2015-12-18 DIAGNOSIS — K219 Gastro-esophageal reflux disease without esophagitis: Secondary | ICD-10-CM | POA: Insufficient documentation

## 2015-12-18 HISTORY — PX: COLONOSCOPY: SHX5424

## 2015-12-18 SURGERY — COLONOSCOPY
Anesthesia: Moderate Sedation

## 2015-12-18 MED ORDER — MEPERIDINE HCL 50 MG/ML IJ SOLN
INTRAMUSCULAR | Status: DC | PRN
  Administered 2015-12-18 (×2): 25 mg

## 2015-12-18 MED ORDER — MIDAZOLAM HCL 5 MG/5ML IJ SOLN
INTRAMUSCULAR | Status: AC
  Filled 2015-12-18: qty 10

## 2015-12-18 MED ORDER — MEPERIDINE HCL 50 MG/ML IJ SOLN
INTRAMUSCULAR | Status: DC
Start: 2015-12-18 — End: 2015-12-18
  Filled 2015-12-18: qty 1

## 2015-12-18 MED ORDER — MIDAZOLAM HCL 5 MG/5ML IJ SOLN
INTRAMUSCULAR | Status: DC | PRN
  Administered 2015-12-18: 2 mg via INTRAVENOUS
  Administered 2015-12-18 (×3): 1 mg via INTRAVENOUS

## 2015-12-18 MED ORDER — STERILE WATER FOR IRRIGATION IR SOLN
Status: DC | PRN
  Administered 2015-12-18: 09:00:00

## 2015-12-18 MED ORDER — SODIUM CHLORIDE 0.9 % IV SOLN
INTRAVENOUS | Status: DC
  Administered 2015-12-18: 09:00:00 via INTRAVENOUS

## 2015-12-18 NOTE — Discharge Instructions (Signed)
No aspirin or NSAIDs for 1 week. Resume warfarin at usual dose starting this evening. Resume other medications as before. High fiber diet. INR check in 7-10 days. No driving for 24 hours. Physician will call with biopsy results. Colonoscopy, Care After Refer to this sheet in the next few weeks. These instructions provide you with information on caring for yourself after your procedure. Your health care provider may also give you more specific instructions. Your treatment has been planned according to current medical practices, but problems sometimes occur. Call your health care provider if you have any problems or questions after your procedure. WHAT TO EXPECT AFTER THE PROCEDURE  After your procedure, it is typical to have the following:  A small amount of blood in your stool.  Moderate amounts of gas and mild abdominal cramping or bloating. HOME CARE INSTRUCTIONS  Do not drive, operate machinery, or sign important documents for 24 hours.  You may shower and resume your regular physical activities, but move at a slower pace for the first 24 hours.  Take frequent rest periods for the first 24 hours.  Walk around or put a warm pack on your abdomen to help reduce abdominal cramping and bloating.  Drink enough fluids to keep your urine clear or pale yellow.  You may resume your normal diet as instructed by your health care provider. Avoid heavy or fried foods that are hard to digest.  Avoid drinking alcohol for 24 hours or as instructed by your health care provider.  Only take over-the-counter or prescription medicines as directed by your health care provider.  If a tissue sample (biopsy) was taken during your procedure:  Do not take aspirin or blood thinners for 7 days, or as instructed by your health care provider.  Do not drink alcohol for 7 days, or as instructed by your health care provider.  Eat soft foods for the first 24 hours. SEEK MEDICAL CARE IF: You have persistent  spotting of blood in your stool 2-3 days after the procedure. SEEK IMMEDIATE MEDICAL CARE IF:  You have more than a small spotting of blood in your stool.  You pass large blood clots in your stool.  Your abdomen is swollen (distended).  You have nausea or vomiting.  You have a fever.  You have increasing abdominal pain that is not relieved with medicine.   This information is not intended to replace advice given to you by your health care provider. Make sure you discuss any questions you have with your health care provider.   Document Released: 01/27/2004 Document Revised: 04/04/2013 Document Reviewed: 02/19/2013 Elsevier Interactive Patient Education 2016 Elsevier Inc. High-Fiber Diet Fiber, also called dietary fiber, is a type of carbohydrate found in fruits, vegetables, whole grains, and beans. A high-fiber diet can have many health benefits. Your health care provider may recommend a high-fiber diet to help:  Prevent constipation. Fiber can make your bowel movements more regular.  Lower your cholesterol.  Relieve hemorrhoids, uncomplicated diverticulosis, or irritable bowel syndrome.  Prevent overeating as part of a weight-loss plan.  Prevent heart disease, type 2 diabetes, and certain cancers. WHAT IS MY PLAN? The recommended daily intake of fiber includes:  38 grams for men under age 64.  97 grams for men over age 85.  55 grams for women under age 11.  65 grams for women over age 85. You can get the recommended daily intake of dietary fiber by eating a variety of fruits, vegetables, grains, and beans. Your health care provider may  also recommend a fiber supplement if it is not possible to get enough fiber through your diet. WHAT DO I NEED TO KNOW ABOUT A HIGH-FIBER DIET?  Fiber supplements have not been widely studied for their effectiveness, so it is better to get fiber through food sources.  Always check the fiber content on thenutrition facts label of any  prepackaged food. Look for foods that contain at least 5 grams of fiber per serving.  Ask your dietitian if you have questions about specific foods that are related to your condition, especially if those foods are not listed in the following section.  Increase your daily fiber consumption gradually. Increasing your intake of dietary fiber too quickly may cause bloating, cramping, or gas.  Drink plenty of water. Water helps you to digest fiber. WHAT FOODS CAN I EAT? Grains Whole-grain breads. Multigrain cereal. Oats and oatmeal. Brown rice. Barley. Bulgur wheat. [place]. Bran muffins. Popcorn. Rye wafer crackers. Vegetables Sweet potatoes. Spinach. Kale. Artichokes. Cabbage. Broccoli. Green peas. Carrots. Squash. Fruits Berries. Pears. Apples. Oranges. Avocados. Prunes and raisins. Dried figs. Meats and Other Protein Sources [place], kidney, pinto, and soy beans. Split peas. Lentils. Nuts and seeds. Dairy Fiber-fortified yogurt. Beverages Fiber-fortified soy milk. Fiber-fortified orange juice. Other Fiber bars. The items listed above may not be a complete list of recommended foods or beverages. Contact your dietitian for more options. WHAT FOODS ARE NOT RECOMMENDED? Grains White bread. Pasta made with refined flour. White rice. Vegetables Fried potatoes. Canned vegetables. Well-cooked vegetables.  Fruits Fruit juice. Cooked, strained fruit. Meats and Other Protein Sources Fatty cuts of meat. Fried Sales executive or fried fish. Dairy Milk. Yogurt. Cream cheese. Sour cream. Beverages Soft drinks. Other Cakes and pastries. Butter and oils. The items listed above may not be a complete list of foods and beverages to avoid. Contact your dietitian for more information. WHAT ARE SOME TIPS FOR INCLUDING HIGH-FIBER FOODS IN MY DIET?  Eat a wide variety of high-fiber foods.  Make sure that half of all grains consumed each day are whole grains.  Replace breads and cereals made from refined flour  or white flour with whole-grain breads and cereals.  Replace white rice with brown rice, bulgur wheat, or millet.  Start the day with a breakfast that is high in fiber, such as a cereal that contains at least 5 grams of fiber per serving.  Use beans in place of meat in soups, salads, or pasta.  Eat high-fiber snacks, such as berries, raw vegetables, nuts, or popcorn.   This information is not intended to replace advice given to you by your health care provider. Make sure you discuss any questions you have with your health care provider.   Document Released: 06/14/2005 Document Revised: 07/05/2014 Document Reviewed: 11/27/2013 Elsevier Interactive Patient Education [employer].

## 2015-12-18 NOTE — H&P (Signed)
Nicholas [name] is an 82 y.o. male.   Chief Complaint: Patient is here for colonoscopy. HPI: Patient is 82 year old Caucasian male was history of multiple colonic adenomas was noted to have heme-positive stool. His annual physical exam. His bowels are usually regular. He has occasional hematochezia in the form of blood and tissue is constipated. Last colonoscopy was in July 2014 and he had one prior to that in November 2013 with removal of multiple adenomas. These colonoscopies and prior ones were done at [facility] in [facility]. Family History significant for CRC and sister who is around 64 and died 2 years later. Patient has been off warfarin for 5 days.  Past Medical History  Diagnosis Date  . Obstructive sleep apnea   . COPD (chronic obstructive pulmonary disease) ([place])   . Osteoarthritis   . Atrial fibrillation ([place])   . Malignant neoplasm of prostate ([place])   . Essential hypertension, benign   . Esophageal reflux   . Cervical spondylosis without myelopathy   . Diverticulosis of colon (without mention of hemorrhage)     Past Surgical History  Procedure Laterality Date  . Total knee arthroplasty      2000, 2005.   [name] Rotator cuff repair    . Esophagogastroduodenoscopy (egd) with esophageal dilation N/A 06/18/2013    Procedure: ESOPHAGOGASTRODUODENOSCOPY (EGD) WITH ESOPHAGEAL DILATION;  Surgeon: [name], MD;  [facility];  Service: Endoscopy;  Laterality: N/A;  220-rescheduled to 730 Ann to notify pt  . Colonoscopy    . Cholecystectomy N/A 07/03/2014    Procedure: LAPAROSCOPIC CHOLECYSTECTOMY;  Surgeon: [name], MD;  Location: [facility];  Service: General;  Laterality: N/A;    Family History  Problem Relation Age of Onset  . Hypertension Father   . Heart attack Maternal Uncle    Social History:  reports that he quit smoking about 50 years ago. His smoking use included Cigarettes. He has a 18 pack-year smoking history. He quit smokeless tobacco use about 48  years ago. His smokeless tobacco use included Chew. He reports that he does not drink alcohol or use illicit drugs.  Allergies:  Allergies  Allergen Reactions  . Sulfonamide Derivatives     REACTION: rash    Medications Prior to Admission  Medication Sig Dispense Refill  . albuterol-ipratropium (COMBIVENT) 18-103 MCG/ACT inhaler Inhale 2 puffs into the lungs every 6 (six) hours as needed (uses rarely). UAD PRN    . allopurinol (ZYLOPRIM) 300 MG tablet Take 300 mg by mouth daily.      . Cholecalciferol (VITAMIN D3) 1000 UNITS tablet Take 1,000 Units by mouth daily.      . Cyanocobalamin (VITAMIN B-12 IJ) Inject as directed every 30 (thirty) days.    [name] esomeprazole (NEXIUM) 40 MG capsule Take 40 mg by mouth daily. 7am    . fluticasone (FLONASE) 50 MCG/ACT nasal spray Place 1 spray into both nostrils daily as needed for allergies or rhinitis.    [name] HYDROcodone-acetaminophen (NORCO/VICODIN) 5-325 MG tablet Take 1 tablet by mouth every 6 (six) hours as needed for moderate pain.    . hydroxypropyl methylcellulose / hypromellose (ISOPTO TEARS / GONIOVISC) 2.5 % ophthalmic solution Place 1 drop into both eyes daily as needed for dry eyes.    . metoprolol tartrate (LOPRESSOR) 25 MG tablet TAKE 1 TABLET TWICE A DAY 180 tablet 3  . Multiple Vitamins-Minerals (PRESERVISION AREDS) CAPS Take 1 capsule by mouth 2 (two) times daily.     . ondansetron (ZOFRAN) 4  MG tablet Take 1 tablet (4 mg total) by mouth every 8 (eight) hours as needed for nausea or vomiting. 60 tablet 1  . polyethylene glycol-electrolytes (NULYTELY/GOLYTELY) 420 g solution Take 4,000 mLs by mouth once. 4000 mL 0  . simethicone (MYLICON) 0000000 MG chewable tablet Chew 125 mg by mouth every 6 (six) hours as needed for flatulence.    . warfarin (COUMADIN) 5 MG tablet Take 2.5-5 mg by mouth See admin instructions. Take 5 mg on Mondays . Take 2.5 mg all other days.  UAD - managed by [name]      No results found for this or any previous  visit (from the past 48 hour(s)). No results found.  ROS  Blood pressure 133/79, pulse 74, temperature 98.8 F (37.1 C), temperature source Oral, resp. rate 25, height 5\' 9"  (1.753 m), weight 232 lb (105.235 kg), SpO2 100 %. Physical Exam  Constitutional: He appears well-developed and well-nourished.  HENT:  Mouth/Throat: Oropharynx is clear and moist.  Eyes: Conjunctivae are normal. No scleral icterus.  Neck: No thyromegaly present.  Cardiovascular:  Irregular rhythm normal S1 and S2. No murmur or gallop noted.  Respiratory: Effort normal and breath sounds normal.  GI: Soft. He exhibits no distension and no mass. There is no tenderness.  Musculoskeletal: He exhibits no edema.  Lymphadenopathy:    He has no cervical adenopathy.  Neurological: He is alert.  Skin: Skin is warm and dry.     Assessment/Plan Heme-positive stool. History of colonic adenomas. Family history of CRC in sibling younger than age 46. Diagnostic colonoscopy.  Hildred Laser, MD 12/18/2015, 9:19 AM

## 2015-12-18 NOTE — Op Note (Addendum)
[facility] Patient Name: Nicholas [name] Procedure Date: 12/18/2015 9:14 AM MRN: [id number] Date of Birth: 21-Aug-1928 Attending MD: Hildred Laser , MD CSN: [id number] Age: 82 Admit Type: Outpatient Procedure:                Colonoscopy Indications:              Heme positive stool Providers:                Hildred Laser, MD, Janeece Riggers, RN, Georgeann Oppenheim,                            Technician Referring MD:             [name], MD Medicines:                Meperidine 50 mg IV, Midazolam 5 mg IV Complications:            No immediate complications. Estimated Blood Loss:     Estimated blood loss was minimal. Procedure:                Pre-Anesthesia Assessment:                           - Prior to the procedure, a History and Physical                            was performed, and patient medications and                            allergies were reviewed. The patient's tolerance of                            previous anesthesia was also reviewed. The risks                            and benefits of the procedure and the sedation                            options and risks were discussed with the patient.                            All questions were answered, and informed consent                            was obtained. Prior Anticoagulants: The patient                            last took Coumadin (warfarin) 5 days prior to the                            procedure. ASA Grade Assessment: III - A patient                            with severe systemic disease. After reviewing the  risks and benefits, the patient was deemed in                            satisfactory condition to undergo the procedure.                           After obtaining informed consent, the colonoscope                            was passed under direct vision. Throughout the                            procedure, the patient's blood pressure, pulse, and   oxygen saturations were monitored continuously. The                            [id number]) scope was introduced through                            the anus and advanced to the the cecum, identified                            by appendiceal orifice and ileocecal valve. The                            colonoscopy was performed without difficulty. The                            patient tolerated the procedure well. The quality                            of the bowel preparation was adequate. The                            ileocecal valve, appendiceal orifice, and rectum                            were photographed. Scope In: 9:29:35 AM Scope Out: 10:11:18 AM Scope Withdrawal Time: 0 hours 27 minutes 59 seconds  Total Procedure Duration: 0 hours 41 minutes 43 seconds  Findings:      Scattered medium-mouthed diverticula were found in the entire colon.      A 4 mm polyp was found in the ascending colon. The polyp was sessile.       Biopsies were taken with a cold forceps for histology.      A 6 to 14 mm polyp was found in the transverse colon. The polyp was       sessile. The polyp was removed with a piecemeal technique using a cold       snare. Resection and retrieval were complete. To prevent bleeding after       the polypectomy, two hemostatic clips were successfully placed (MR       conditional). There was no bleeding at the end of the procedure.      Multiple small angioectasias without bleeding were found in the rectum.      External hemorrhoids  were found during retroflexion. The hemorrhoids       were medium-sized. Impression:               - Diverticulosis in the entire examined colon.                           - One 4 mm polyp in the ascending colon. Biopsied.                           - One 6 x 14 mm polyp in the transverse colon,                            removed piecemeal using a cold snare. this polyp                            felt to be residual asspot diet noted next  to it.                            Resected and retrieved. Clips (MR conditional) were                            placed.                           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                           - External hemorrhoids. Moderate Sedation:      Moderate (conscious) sedation was administered by the endoscopy nurse       and supervised by the endoscopist. The following parameters were       monitored: oxygen saturation, heart rate, blood pressure, CO2       capnography and response to care. Total physician intraservice time was       47 minutes. Recommendation:           - Patient has a contact number available for                            emergencies. The signs and symptoms of potential                            delayed complications were discussed with the                            patient. Return to normal activities tomorrow.                            Written discharge instructions were provided to the                            patient.                           -  Patient has a contact number available for                            emergencies. The signs and symptoms of potential                            delayed complications were discussed with the                            patient. Return to normal activities tomorrow.                            Written discharge instructions were provided to the                            patient.                           - Resume previous diet today.                           - Continue present medications.                           - Resume Coumadin (warfarin) at prior dose today.                            Refer to [facility] for further adjustment of                            therapy.                           - Await pathology results.                           - No recommendation at this time regarding repeat                            colonoscopy due to  age. Procedure Code(s):        --- Professional ---                           [phone], Colonoscopy, flexible; with removal of                            tumor(s), polyp(s), or other lesion(s) by snare                            technique                           [id number], 59, Colonoscopy, flexible; with biopsy,                            single or multiple  [id number], Moderate sedation services provided by the                            same physician or other qualified health care                            professional performing the diagnostic or                            therapeutic service that the sedation supports,                            requiring the presence of an independent trained                            observer to assist in the monitoring of the                            patient's level of consciousness and physiological                            status; initial 15 minutes of intraservice time,                            patient age 59 years or older                           [phone], Moderate sedation services; each additional                            15 minutes intraservice time                           [id number], Moderate sedation services; each additional                            15 minutes intraservice time Diagnosis Code(s):        --- Professional ---                           K64.4, Residual hemorrhoidal skin tags                           D12.2, Benign neoplasm of ascending colon                           D12.3, Benign neoplasm of transverse colon (hepatic                            flexure or splenic flexure)                           K55.20, Angiodysplasia of colon without hemorrhage                           R19.5, Other  fecal abnormalities                           K57.30, Diverticulosis of large intestine without                            perforation or abscess without bleeding CPT copyright 2016 [employer]. All  rights reserved. The codes documented in this report are preliminary and upon coder review may  be revised to meet current compliance requirements. Hildred Laser, MD Hildred Laser, MD 12/18/2015 10:23:21 AM This report has been signed electronically. Number of Addenda: 0

## 2015-12-22 ENCOUNTER — Encounter (HOSPITAL_COMMUNITY): Payer: Self-pay | Admitting: Internal Medicine

## 2015-12-24 DIAGNOSIS — I739 Peripheral vascular disease, unspecified: Secondary | ICD-10-CM | POA: Diagnosis not present

## 2015-12-25 DIAGNOSIS — F329 Major depressive disorder, single episode, unspecified: Secondary | ICD-10-CM | POA: Diagnosis not present

## 2015-12-25 DIAGNOSIS — I482 Chronic atrial fibrillation: Secondary | ICD-10-CM | POA: Diagnosis not present

## 2015-12-25 DIAGNOSIS — Z299 Encounter for prophylactic measures, unspecified: Secondary | ICD-10-CM | POA: Diagnosis not present

## 2015-12-25 DIAGNOSIS — E538 Deficiency of other specified B group vitamins: Secondary | ICD-10-CM | POA: Diagnosis not present

## 2015-12-26 IMAGING — US US ABDOMEN COMPLETE
1 series · 14 of 25 positions shown · non-contrast
Comparison: None.

CLINICAL DATA: Nausea and vomiting.

EXAM:
ULTRASOUND ABDOMEN COMPLETE

[Series 1: us abdomen complete · 0.24mm/px · 14 of 80 slices shown]
[im 1/80]
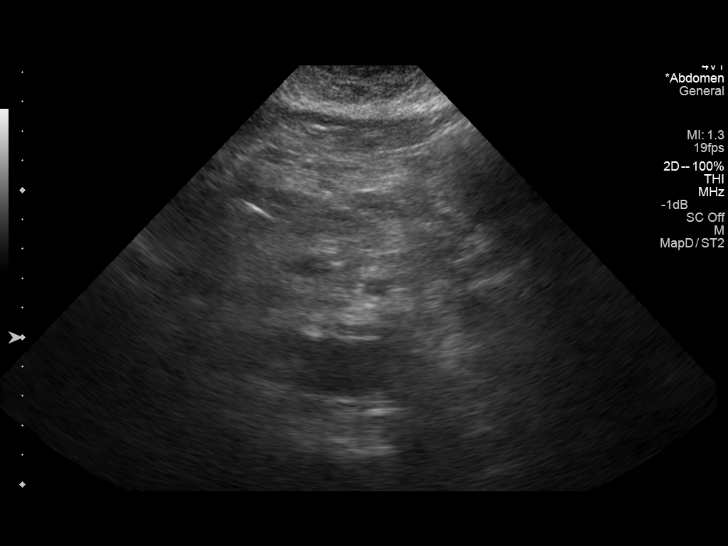
[im 7/80]
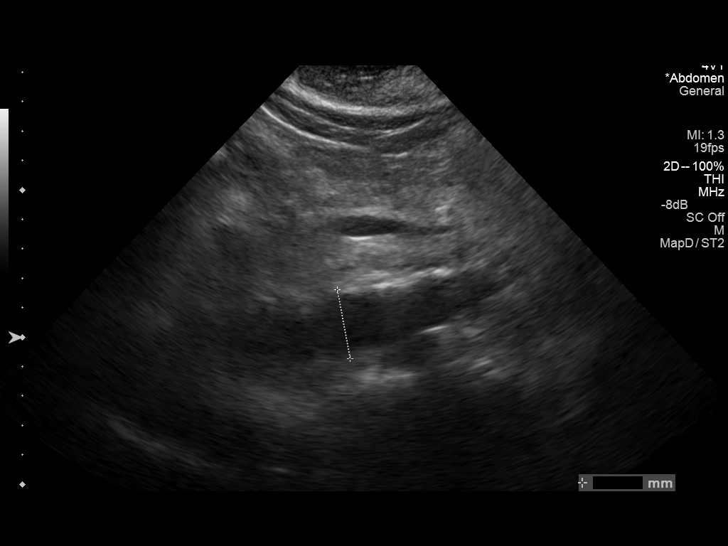
[im 14/80]
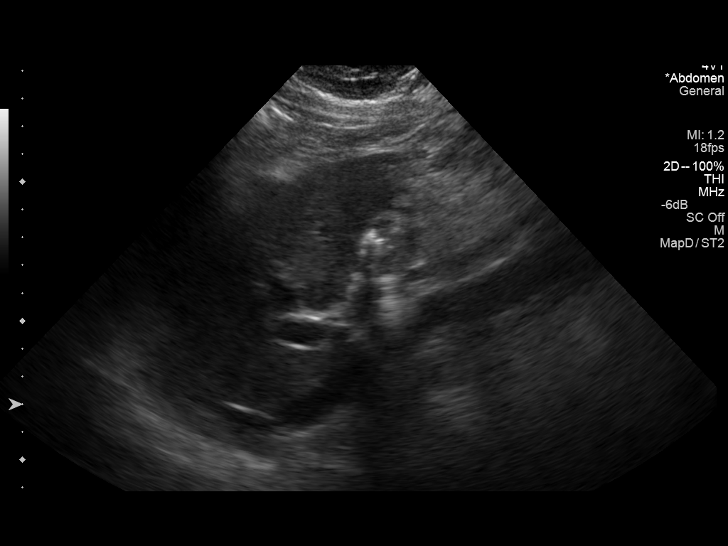
[im 20/80]
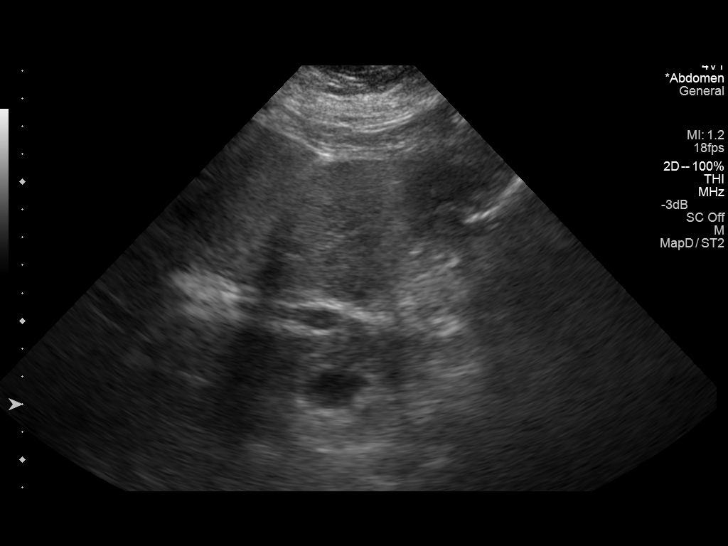
[im 27/80]
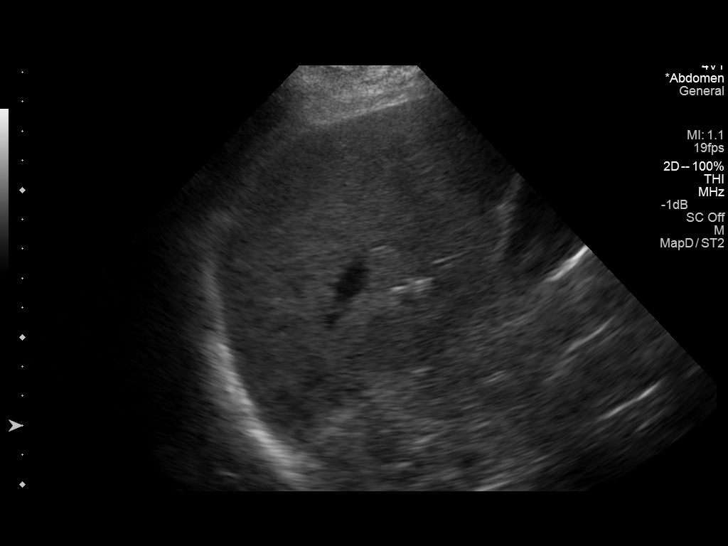
[im 30/80]
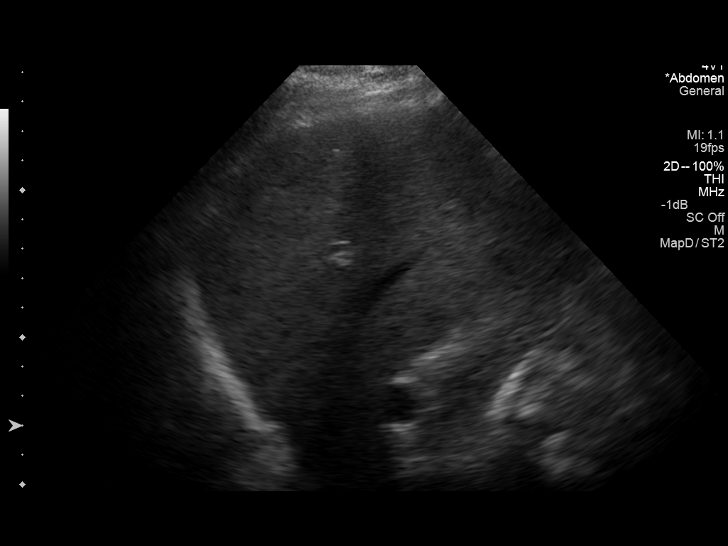
[im 37/80]
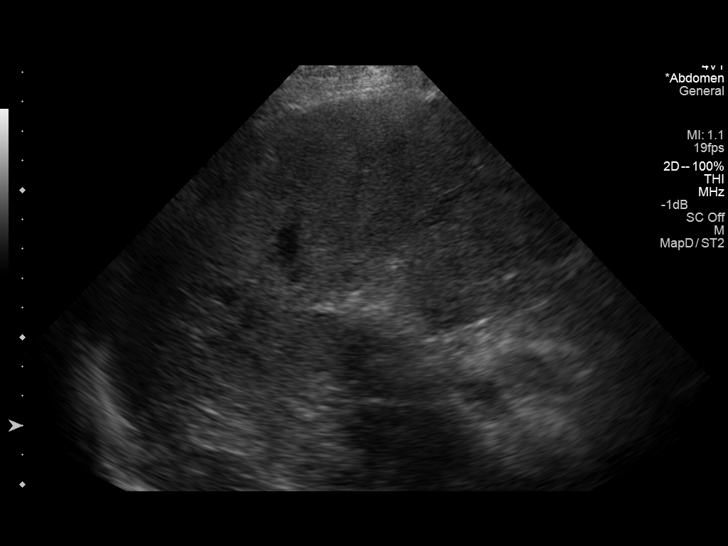
[im 43/80]
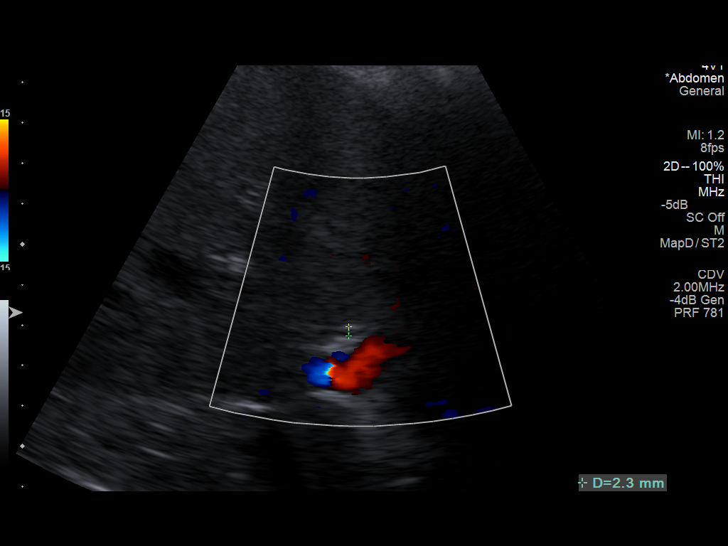
[im 50/80]
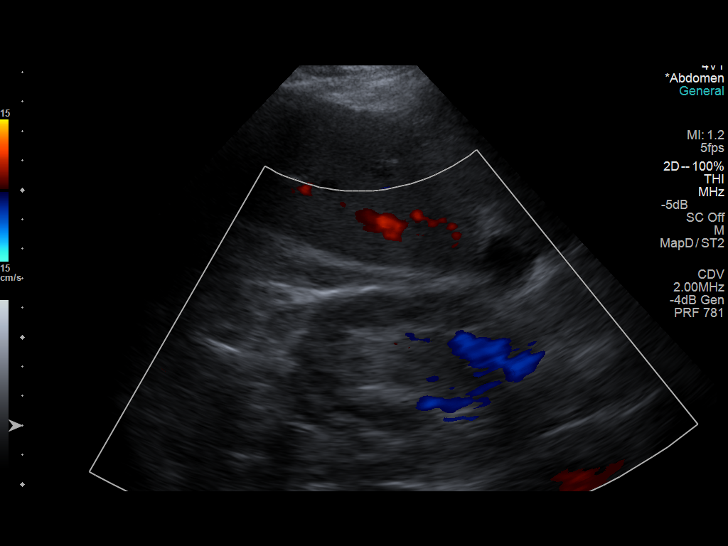
[im 53/80]
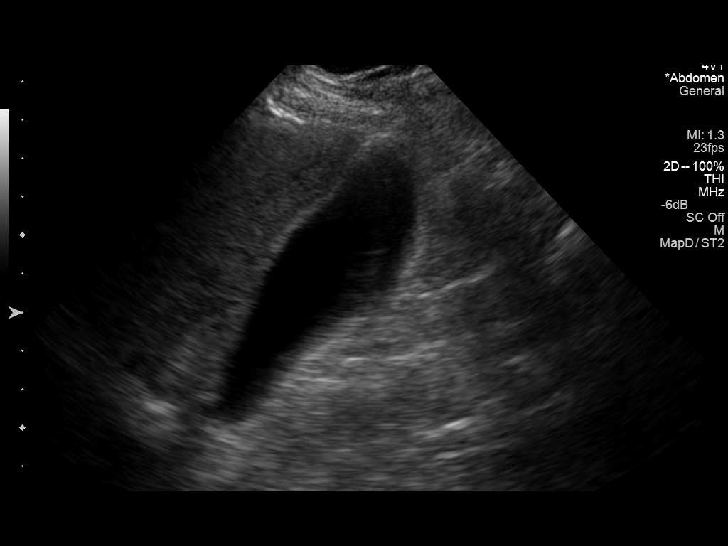
[im 60/80]
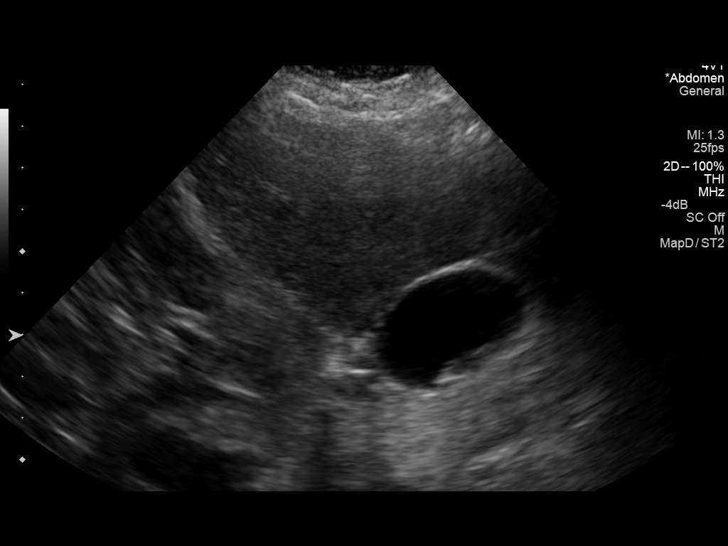
[im 66/80]
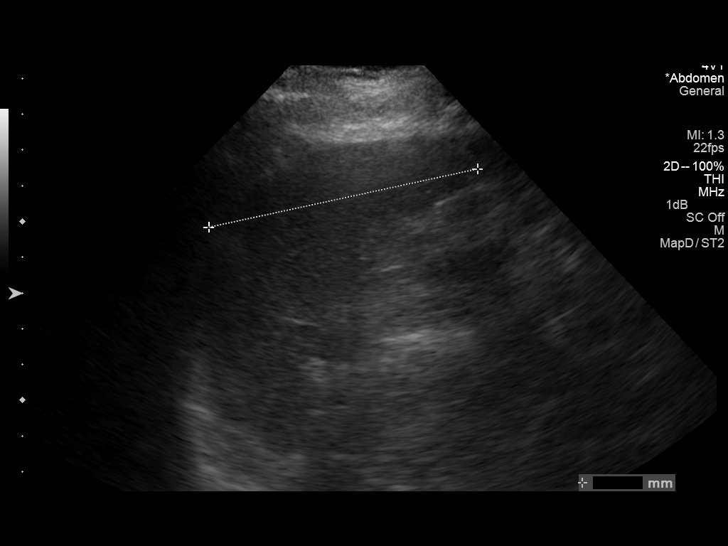
[im 73/80]
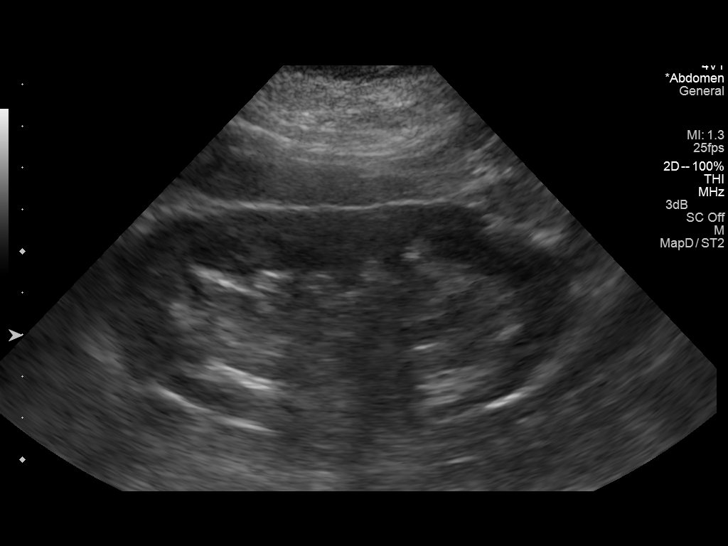
[im 80/80]
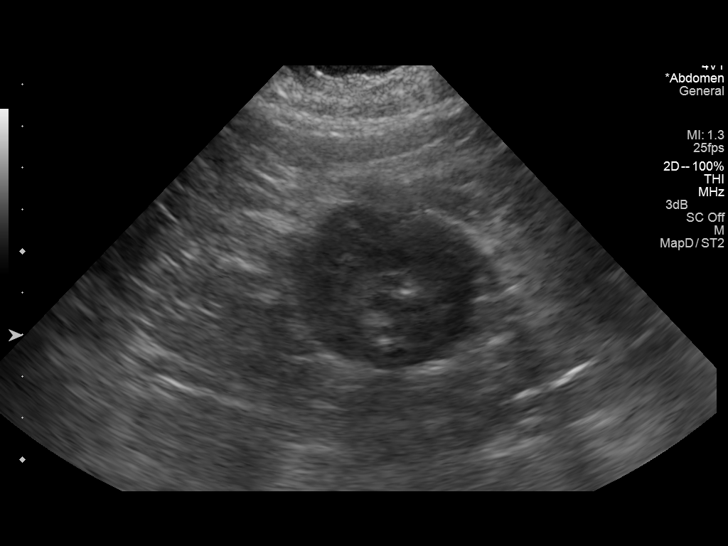

[14 of 25 positions shown; findings below may reference images not displayed]

FINDINGS: Gallbladder: Small stones are noted layering within the gallbladder.
No gallbladder wall thickening. No pericholecystic fluid or
sonographic Murphy's sign.

Common bile duct: Diameter: 2.3 mm.

Liver: No focal lesion identified. Within normal limits in
parenchymal echogenicity.

IVC: No abnormality visualized.

Pancreas: Visualized portion unremarkable.

Spleen: Size and appearance within normal limits.

Right Kidney: Length: 10.6 cm. Echogenicity within normal limits. No
mass or hydronephrosis visualized.

Left Kidney: Length: 11.4 cm. Echogenicity within normal limits. No
mass or hydronephrosis visualized.

Abdominal aorta: No aneurysm visualized.

Other findings: None.
IMPRESSION: 1. No acute findings.
2. Gallstones.

## 2016-01-22 DIAGNOSIS — I10 Essential (primary) hypertension: Secondary | ICD-10-CM | POA: Diagnosis not present

## 2016-01-22 DIAGNOSIS — I482 Chronic atrial fibrillation: Secondary | ICD-10-CM | POA: Diagnosis not present

## 2016-01-22 DIAGNOSIS — E538 Deficiency of other specified B group vitamins: Secondary | ICD-10-CM | POA: Diagnosis not present

## 2016-01-22 DIAGNOSIS — J449 Chronic obstructive pulmonary disease, unspecified: Secondary | ICD-10-CM | POA: Diagnosis not present

## 2016-02-20 DIAGNOSIS — I482 Chronic atrial fibrillation: Secondary | ICD-10-CM | POA: Diagnosis not present

## 2016-02-20 DIAGNOSIS — E538 Deficiency of other specified B group vitamins: Secondary | ICD-10-CM | POA: Diagnosis not present

## 2016-02-20 DIAGNOSIS — I5030 Unspecified diastolic (congestive) heart failure: Secondary | ICD-10-CM | POA: Diagnosis not present

## 2016-02-20 DIAGNOSIS — Z299 Encounter for prophylactic measures, unspecified: Secondary | ICD-10-CM | POA: Diagnosis not present

## 2016-02-25 ENCOUNTER — Encounter (INDEPENDENT_AMBULATORY_CARE_PROVIDER_SITE_OTHER): Payer: Self-pay | Admitting: Internal Medicine

## 2016-02-25 ENCOUNTER — Ambulatory Visit (INDEPENDENT_AMBULATORY_CARE_PROVIDER_SITE_OTHER): Payer: Medicare Other | Admitting: Internal Medicine

## 2016-02-25 VITALS — BP 150/87 | HR 64 | Temp 97.7°F | Ht 69.0 in | Wt 230.0 lb

## 2016-02-25 DIAGNOSIS — R140 Abdominal distension (gaseous): Secondary | ICD-10-CM

## 2016-02-25 DIAGNOSIS — K625 Hemorrhage of anus and rectum: Secondary | ICD-10-CM | POA: Diagnosis not present

## 2016-02-25 DIAGNOSIS — R143 Flatulence: Secondary | ICD-10-CM

## 2016-02-25 NOTE — Patient Instructions (Addendum)
  Stop the Metamucil.

## 2016-02-25 NOTE — Progress Notes (Signed)
Subjective:    Patient ID: Nicholas [name], male    DOB: [birth year], 82 y.o.   MRN: [id number]  HPI  Presents today with c/o that his bowel are not just right.  He is having a BM usually every day. Sometimes he may go every two days. He takes Metamucil for his BMs. He has a lot of gas in the evening and sometimes he has to take Simethicone.  Sometimes he says his foods are slow to go down. Sometimes he has to swallow twice to have the food to go down.  He has had to take an Imodium for loose stools.  Sometimes he sees blood after he has a BM . Recently underwent an colonoscopy see below.  Presently taking Aciphex for GERD.   12/18/2015 Colonoscopy:    Indications:              Heme positive stool Impression:               - Diverticulosis in the entire examined colon.                           - One 4 mm polyp in the ascending colon. Biopsied.                           - One 6 x 14 mm polyp in the transverse colon,                            removed piecemeal using a cold snare. this polyp                            felt to be residual asspot diet noted next to it.                            Resected and retrieved. Clips (MR conditional) were                            placed.                           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                           - External hemorrhoids.  Polyps are tubular adenomas. Results given to patient. Office visit in 5 years to determine whether or not he should undergo future   Colonoscopy in 2013 by Dr. Truddie Hidden. Revealed no colonic neoplasms. 5 polyps.  Biopsy: Tubular adenoma. One was tubulovillous adenoma.    Family hx of colon cancer in a sister age died in her 57s.  01/27/2013 Colonoscopy: Dr. Durenda Hurt colonic or rectal polyps. Pandiverticulosis. Redundant colon.   09/04/2008 Colonoscopy Dr Lindalou Hose: Family hx of colon cancer. Personal hx of colonic polyps. 61mm sessile  polyp in the sigmoid colon. 29mm four polyps in the ascending colon. 87mm sessile polyp in the cecum. Repeat in 2 yrs. Biopsy: Tubular adenoma.  Review of Systems    Review of  Systems Past Medical History:  Diagnosis Date  . Atrial fibrillation ([place])   . Cervical spondylosis without myelopathy   . COPD (chronic obstructive pulmonary disease) ([facility])   . Diverticulosis of colon (without mention of hemorrhage)   . Esophageal reflux   . Essential hypertension, benign   . Malignant neoplasm of prostate ([place])   . Obstructive sleep apnea   . Osteoarthritis     Past Surgical History:  Procedure Laterality Date  . CHOLECYSTECTOMY N/A 07/03/2014   Procedure: LAPAROSCOPIC CHOLECYSTECTOMY;  Surgeon: [name], MD;  Location: [facility] OR[facility];  Service: General;  Laterality: N/A;  . COLONOSCOPY    . COLONOSCOPY N/A 12/18/2015   Procedure: COLONOSCOPY;  Surgeon: [name], MD;  Location: [facility];  Service: Endoscopy;  Laterality: N/A;  1200  . ESOPHAGOGASTRODUODENOSCOPY (EGD) WITH ESOPHAGEAL DILATION N/A 06/18/2013   Procedure: ESOPHAGOGASTRODUODENOSCOPY (EGD) WITH ESOPHAGEAL DILATION;  Surgeon: [name], MD;  Location: [facility];  Service: Endoscopy;  Laterality: N/A;  220-rescheduled to 730 Ann to notify pt  . ROTATOR CUFF REPAIR    . TOTAL KNEE ARTHROPLASTY     2000, 2005.     Allergies  Allergen Reactions  . Sulfonamide Derivatives     REACTION: rash    Current Outpatient Prescriptions on File Prior to Visit  Medication Sig Dispense Refill  . albuterol-ipratropium (COMBIVENT) 18-103 MCG/ACT inhaler Inhale 2 puffs into the lungs every 6 (six) hours as needed (uses rarely). UAD PRN    . allopurinol (ZYLOPRIM) 300 MG tablet Take 300 mg by mouth daily.      . Cholecalciferol (VITAMIN D3) 1000 UNITS tablet Take 1,000 Units by mouth daily.      . Cyanocobalamin (VITAMIN B-12 IJ) Inject as directed every 30 (thirty) days.    . fluticasone (FLONASE) 50 MCG/ACT nasal spray  Place 1 spray into both nostrils daily as needed for allergies or rhinitis.    [name] HYDROcodone-acetaminophen (NORCO/VICODIN) 5-325 MG tablet Take 1 tablet by mouth every 6 (six) hours as needed for moderate pain.    . hydroxypropyl methylcellulose / hypromellose (ISOPTO TEARS / GONIOVISC) 2.5 % ophthalmic solution Place 1 drop into both eyes daily as needed for dry eyes.    . metoprolol tartrate (LOPRESSOR) 25 MG tablet TAKE 1 TABLET TWICE A DAY 180 tablet 3  . Multiple Vitamins-Minerals (PRESERVISION AREDS) CAPS Take 1 capsule by mouth 2 (two) times daily.     . ondansetron (ZOFRAN) 4 MG tablet Take 1 tablet (4 mg total) by mouth every 8 (eight) hours as needed for nausea or vomiting. 60 tablet 1  . simethicone (MYLICON) 0000000 MG chewable tablet Chew 125 mg by mouth every 6 (six) hours as needed for flatulence.    . warfarin (COUMADIN) 5 MG tablet Take 2.5-5 mg by mouth See admin instructions. Take 5 mg on Mondays . Take 2.5 mg all other days.  UAD - managed by [name]     No current facility-administered medications on file prior to visit.        Objective:   Physical Exam Blood pressure (!) 150/87, pulse 64, temperature 97.7 F (36.5 C), height 5\' 9"  (1.753 m), weight 230 lb (104.3 kg).  Alert and oriented. Skin warm and dry. Oral mucosa is moist.   . Sclera anicteric, conjunctivae is pink. Thyroid not enlarged. No cervical lymphadenopathy. Lungs clear. Heart regular rate and rhythm.  Abdomen is soft. Bowel sounds are positive. No hepatomegaly. No abdominal masses felt. No tenderness.  No edema  to lower extremities.        Assessment & Plan:  Rectal bleeding probably from hemorrhoids.  OV as needed. Stop the Metamucil

## 2016-03-03 DIAGNOSIS — I739 Peripheral vascular disease, unspecified: Secondary | ICD-10-CM | POA: Diagnosis not present

## 2016-03-15 ENCOUNTER — Telehealth (INDEPENDENT_AMBULATORY_CARE_PROVIDER_SITE_OTHER): Payer: Self-pay | Admitting: Internal Medicine

## 2016-03-15 NOTE — Telephone Encounter (Signed)
Patient called, stated to let Terri know that he has not had as much gas since he stopped the metamucil.  Still having some nausea, especially in the mornings.   He also has not gained back his appetite.  [phone]

## 2016-03-16 NOTE — Telephone Encounter (Signed)
May take Zofran for the nausea

## 2016-03-18 DIAGNOSIS — Z23 Encounter for immunization: Secondary | ICD-10-CM | POA: Diagnosis not present

## 2016-03-22 DIAGNOSIS — I10 Essential (primary) hypertension: Secondary | ICD-10-CM | POA: Diagnosis not present

## 2016-03-22 DIAGNOSIS — M549 Dorsalgia, unspecified: Secondary | ICD-10-CM | POA: Diagnosis not present

## 2016-03-22 DIAGNOSIS — Z299 Encounter for prophylactic measures, unspecified: Secondary | ICD-10-CM | POA: Diagnosis not present

## 2016-03-22 DIAGNOSIS — E538 Deficiency of other specified B group vitamins: Secondary | ICD-10-CM | POA: Diagnosis not present

## 2016-03-22 DIAGNOSIS — G4700 Insomnia, unspecified: Secondary | ICD-10-CM | POA: Diagnosis not present

## 2016-03-22 DIAGNOSIS — Z6836 Body mass index (BMI) 36.0-36.9, adult: Secondary | ICD-10-CM | POA: Diagnosis not present

## 2016-03-22 DIAGNOSIS — I482 Chronic atrial fibrillation: Secondary | ICD-10-CM | POA: Diagnosis not present

## 2016-03-24 DIAGNOSIS — H353131 Nonexudative age-related macular degeneration, bilateral, early dry stage: Secondary | ICD-10-CM | POA: Diagnosis not present

## 2016-03-24 DIAGNOSIS — H26491 Other secondary cataract, right eye: Secondary | ICD-10-CM | POA: Diagnosis not present

## 2016-03-24 DIAGNOSIS — Z961 Presence of intraocular lens: Secondary | ICD-10-CM | POA: Diagnosis not present

## 2016-04-14 ENCOUNTER — Other Ambulatory Visit (INDEPENDENT_AMBULATORY_CARE_PROVIDER_SITE_OTHER): Payer: Self-pay | Admitting: Internal Medicine

## 2016-04-14 DIAGNOSIS — R110 Nausea: Secondary | ICD-10-CM

## 2016-04-19 DIAGNOSIS — I482 Chronic atrial fibrillation: Secondary | ICD-10-CM | POA: Diagnosis not present

## 2016-04-19 DIAGNOSIS — K219 Gastro-esophageal reflux disease without esophagitis: Secondary | ICD-10-CM | POA: Diagnosis not present

## 2016-04-19 DIAGNOSIS — Z6836 Body mass index (BMI) 36.0-36.9, adult: Secondary | ICD-10-CM | POA: Diagnosis not present

## 2016-04-19 DIAGNOSIS — J441 Chronic obstructive pulmonary disease with (acute) exacerbation: Secondary | ICD-10-CM | POA: Diagnosis not present

## 2016-04-19 DIAGNOSIS — M549 Dorsalgia, unspecified: Secondary | ICD-10-CM | POA: Diagnosis not present

## 2016-04-19 DIAGNOSIS — Z299 Encounter for prophylactic measures, unspecified: Secondary | ICD-10-CM | POA: Diagnosis not present

## 2016-04-19 DIAGNOSIS — E538 Deficiency of other specified B group vitamins: Secondary | ICD-10-CM | POA: Diagnosis not present

## 2016-05-08 ENCOUNTER — Other Ambulatory Visit: Payer: Self-pay | Admitting: Cardiology

## 2016-05-11 DIAGNOSIS — M79672 Pain in left foot: Secondary | ICD-10-CM | POA: Diagnosis not present

## 2016-05-11 DIAGNOSIS — L600 Ingrowing nail: Secondary | ICD-10-CM | POA: Diagnosis not present

## 2016-05-11 DIAGNOSIS — M79675 Pain in left toe(s): Secondary | ICD-10-CM | POA: Diagnosis not present

## 2016-05-11 DIAGNOSIS — L03032 Cellulitis of left toe: Secondary | ICD-10-CM | POA: Diagnosis not present

## 2016-05-25 DIAGNOSIS — C61 Malignant neoplasm of prostate: Secondary | ICD-10-CM | POA: Diagnosis not present

## 2016-05-27 ENCOUNTER — Encounter (INDEPENDENT_AMBULATORY_CARE_PROVIDER_SITE_OTHER): Payer: Self-pay | Admitting: Internal Medicine

## 2016-05-27 ENCOUNTER — Encounter (INDEPENDENT_AMBULATORY_CARE_PROVIDER_SITE_OTHER): Payer: Self-pay | Admitting: *Deleted

## 2016-05-27 ENCOUNTER — Other Ambulatory Visit (INDEPENDENT_AMBULATORY_CARE_PROVIDER_SITE_OTHER): Payer: Self-pay | Admitting: Internal Medicine

## 2016-05-27 ENCOUNTER — Encounter (INDEPENDENT_AMBULATORY_CARE_PROVIDER_SITE_OTHER): Payer: Self-pay

## 2016-05-27 ENCOUNTER — Ambulatory Visit (INDEPENDENT_AMBULATORY_CARE_PROVIDER_SITE_OTHER): Payer: Medicare Other | Admitting: Internal Medicine

## 2016-05-27 VITALS — BP 110/80 | HR 72 | Temp 98.2°F | Ht 69.0 in | Wt 229.5 lb

## 2016-05-27 DIAGNOSIS — R1310 Dysphagia, unspecified: Secondary | ICD-10-CM | POA: Insufficient documentation

## 2016-05-27 DIAGNOSIS — K219 Gastro-esophageal reflux disease without esophagitis: Secondary | ICD-10-CM | POA: Diagnosis not present

## 2016-05-27 DIAGNOSIS — R1319 Other dysphagia: Secondary | ICD-10-CM | POA: Insufficient documentation

## 2016-05-27 NOTE — Progress Notes (Signed)
Subjective:    Patient ID: Nicholas [name], male    DOB: September 10, 1928, 82 y.o.   MRN: [id number]  HPI Here today with c/o dysphagia.  He says he has a lot of gas after he eats. Sometimes he has nausea. When he gets up he feels good.  Sometimes he has nausea.  Sometimes he has pressure in his esophagus. He takes Simethicone as needed for gas.  His appetite is good. No weight loss.   He chews his foods up well.  Underwent an Esphagram in 2016 which revealed presbyesophagus.  Foods still feel like they are slow to go down.  Has a BM x 1 a day.  Hx of EGD/ED in 2011  Hx of atrial fib and maintained on Coumadin.   03/20/2015 DG Esophagram: dysphagia.  IMPRESSION: Mild tertiary contractions are noted in the distal esophagus consistent with presbyesophagus. No other significant abnormality seen in the esophagus.  12/18/2015 Colonoscopy:    Indications: Heme positive stool Impression: - Diverticulosis in the entire examined colon. - One 4 mm polyp in the ascending colon. Biopsied. - One 6 x 14 mm polyp in the transverse colon,  removed piecemeal using a cold snare. this polyp  felt to be residual asspot diet noted next to it.  Resected and retrieved. Clips (MR conditional) were  placed. 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 - External hemorrhoids.  Polyps are tubular adenomas. Results given to patient. Office visit in 5 years to determine whether or not he should undergo future   Colonoscopy in 2013by Dr. Truddie Hidden. Revealed no colonic neoplasms. 5 polyps.  Biopsy: Tubular adenoma. One was tubulovillous adenoma.    Family hx of colon cancer in a sister age died in her 46s.  2013-02-02 Colonoscopy:  Dr. Durenda Hurt colonic or rectal polyps. Pandiverticulosis. Redundant colon.   09/04/2008 Colonoscopy Dr Lindalou Hose: Family hx of colon cancer. Personal hx of colonic polyps. 85mm sessile polyp in the sigmoid colon. 28mm four polyps in the ascending colon. 49mm sessile polyp in the cecum. Repeat in 2 yrs. Biopsy: Tubular adenoma.  Review of Systems     Review of Systems Past Medical History:  Diagnosis Date  . Atrial fibrillation ([place])   . Cervical spondylosis without myelopathy   . COPD (chronic obstructive pulmonary disease) ([place])   . Diverticulosis of colon (without mention of hemorrhage)   . Esophageal reflux   . Essential hypertension, benign   . Malignant neoplasm of prostate ([place])   . Obstructive sleep apnea   . Osteoarthritis     Past Surgical History:  Procedure Laterality Date  . CHOLECYSTECTOMY N/A 07/03/2014   Procedure: LAPAROSCOPIC CHOLECYSTECTOMY;  Surgeon: [name], MD;  Location: [facility];  Service: General;  Laterality: N/A;  . COLONOSCOPY    . COLONOSCOPY N/A 12/18/2015   Procedure: COLONOSCOPY;  Surgeon: [name], MD;  Location: [facility];  Service: Endoscopy;  Laterality: N/A;  1200  . ESOPHAGOGASTRODUODENOSCOPY (EGD) WITH ESOPHAGEAL DILATION N/A 06/18/2013   Procedure: ESOPHAGOGASTRODUODENOSCOPY (EGD) WITH ESOPHAGEAL DILATION;  Surgeon: [name], MD;  Location[facility];  Service: Endoscopy;  Laterality: N/A;  220-rescheduled to 730 Ann to notify pt  . ROTATOR CUFF REPAIR    . TOTAL KNEE ARTHROPLASTY     2000, 2005.     Allergies  Allergen Reactions  . Sulfonamide Derivatives     REACTION: rash    Current Outpatient Prescriptions on File Prior to Visit  Medication Sig Dispense Refill  . albuterol-ipratropium (COMBIVENT)  18-103 MCG/ACT inhaler Inhale 2 puffs into the lungs every 6 (six) hours as needed (uses rarely). UAD PRN    . allopurinol (ZYLOPRIM) 300 MG tablet Take 300 mg by mouth daily.      . Cholecalciferol (VITAMIN  D3) 1000 UNITS tablet Take 1,000 Units by mouth daily.      . Cyanocobalamin (VITAMIN B-12 IJ) Inject as directed every 30 (thirty) days.    . fluticasone (FLONASE) 50 MCG/ACT nasal spray Place 1 spray into both nostrils daily as needed for allergies or rhinitis.    [name] HYDROcodone-acetaminophen (NORCO/VICODIN) 5-325 MG tablet Take 1 tablet by mouth every 6 (six) hours as needed for moderate pain.    . hydroxypropyl methylcellulose / hypromellose (ISOPTO TEARS / GONIOVISC) 2.5 % ophthalmic solution Place 1 drop into both eyes daily as needed for dry eyes.    . metoprolol tartrate (LOPRESSOR) 25 MG tablet TAKE 1 TABLET TWICE A DAY 180 tablet 3  . Multiple Vitamins-Minerals (PRESERVISION AREDS) CAPS Take 1 capsule by mouth 2 (two) times daily.     . ondansetron (ZOFRAN) 4 MG tablet TAKE 1 TABLET EVERY 8 HOURS AS NEEDED FOR NAUSEA OR VOMITING 60 tablet 1  . simethicone (MYLICON) 0000000 MG chewable tablet Chew 125 mg by mouth every 6 (six) hours as needed for flatulence.    . warfarin (COUMADIN) 5 MG tablet Take 2.5-5 mg by mouth See admin instructions. Take 5 mg on Mondays . Take 2.5 mg all other days.  UAD - managed by [name]    . RABEprazole (ACIPHEX) 20 MG tablet Take 20 mg by mouth daily.     No current facility-administered medications on file prior to visit.        Objective:   Physical Exam Blood pressure 110/80, pulse 72, temperature 98.2 F (36.8 C), height 5\' 9"  (1.753 m), weight 229 lb 8 oz (104.1 kg). Alert and oriented. Skin warm and dry. Oral mucosa is moist.   . Sclera anicteric, conjunctivae is pink. Thyroid not enlarged. No cervical lymphadenopathy. Lungs clear. Heart regular rate and rhythm.  Abdomen is soft. Bowel sounds are positive. No hepatomegaly. No abdominal masses felt. No tenderness.  No edema to lower extremities         Assessment & Plan:  Dysphagia. Continue to chew foods  EGD/EDThe risks and benefits such as perforation, bleeding, and infection were reviewed with  the patient and is agreeable.. . Continue the Simethicone as needed for gas.  OV in one year.

## 2016-05-27 NOTE — Patient Instructions (Addendum)
EGD/ED. The risks and benefits such as perforation, bleeding, and infection were reviewed with the patient and is agreeable. 

## 2016-05-31 DIAGNOSIS — I10 Essential (primary) hypertension: Secondary | ICD-10-CM | POA: Diagnosis not present

## 2016-05-31 DIAGNOSIS — F329 Major depressive disorder, single episode, unspecified: Secondary | ICD-10-CM | POA: Diagnosis not present

## 2016-05-31 DIAGNOSIS — Z6836 Body mass index (BMI) 36.0-36.9, adult: Secondary | ICD-10-CM | POA: Diagnosis not present

## 2016-05-31 DIAGNOSIS — I482 Chronic atrial fibrillation: Secondary | ICD-10-CM | POA: Diagnosis not present

## 2016-05-31 DIAGNOSIS — Z299 Encounter for prophylactic measures, unspecified: Secondary | ICD-10-CM | POA: Diagnosis not present

## 2016-05-31 DIAGNOSIS — J449 Chronic obstructive pulmonary disease, unspecified: Secondary | ICD-10-CM | POA: Diagnosis not present

## 2016-05-31 DIAGNOSIS — E538 Deficiency of other specified B group vitamins: Secondary | ICD-10-CM | POA: Diagnosis not present

## 2016-06-01 DIAGNOSIS — L03032 Cellulitis of left toe: Secondary | ICD-10-CM | POA: Diagnosis not present

## 2016-06-01 DIAGNOSIS — L600 Ingrowing nail: Secondary | ICD-10-CM | POA: Diagnosis not present

## 2016-06-01 DIAGNOSIS — M79675 Pain in left toe(s): Secondary | ICD-10-CM | POA: Diagnosis not present

## 2016-06-01 DIAGNOSIS — M79672 Pain in left foot: Secondary | ICD-10-CM | POA: Diagnosis not present

## 2016-06-08 ENCOUNTER — Ambulatory Visit (INDEPENDENT_AMBULATORY_CARE_PROVIDER_SITE_OTHER): Payer: Medicare Other | Admitting: Urology

## 2016-06-08 DIAGNOSIS — N5201 Erectile dysfunction due to arterial insufficiency: Secondary | ICD-10-CM

## 2016-06-08 DIAGNOSIS — N401 Enlarged prostate with lower urinary tract symptoms: Secondary | ICD-10-CM | POA: Diagnosis not present

## 2016-06-08 DIAGNOSIS — C61 Malignant neoplasm of prostate: Secondary | ICD-10-CM | POA: Diagnosis not present

## 2016-06-15 DIAGNOSIS — M19012 Primary osteoarthritis, left shoulder: Secondary | ICD-10-CM | POA: Diagnosis not present

## 2016-06-15 DIAGNOSIS — M25512 Pain in left shoulder: Secondary | ICD-10-CM | POA: Diagnosis not present

## 2016-06-15 DIAGNOSIS — M7522 Bicipital tendinitis, left shoulder: Secondary | ICD-10-CM | POA: Diagnosis not present

## 2016-06-15 DIAGNOSIS — M75122 Complete rotator cuff tear or rupture of left shoulder, not specified as traumatic: Secondary | ICD-10-CM | POA: Diagnosis not present

## 2016-06-17 DIAGNOSIS — I10 Essential (primary) hypertension: Secondary | ICD-10-CM | POA: Diagnosis not present

## 2016-06-17 DIAGNOSIS — Z299 Encounter for prophylactic measures, unspecified: Secondary | ICD-10-CM | POA: Diagnosis not present

## 2016-06-17 DIAGNOSIS — J069 Acute upper respiratory infection, unspecified: Secondary | ICD-10-CM | POA: Diagnosis not present

## 2016-06-17 DIAGNOSIS — Z87891 Personal history of nicotine dependence: Secondary | ICD-10-CM | POA: Diagnosis not present

## 2016-06-17 DIAGNOSIS — J449 Chronic obstructive pulmonary disease, unspecified: Secondary | ICD-10-CM | POA: Diagnosis not present

## 2016-06-17 DIAGNOSIS — Z6835 Body mass index (BMI) 35.0-35.9, adult: Secondary | ICD-10-CM | POA: Diagnosis not present

## 2016-06-23 ENCOUNTER — Ambulatory Visit (INDEPENDENT_AMBULATORY_CARE_PROVIDER_SITE_OTHER): Payer: Medicare Other | Admitting: Cardiology

## 2016-06-23 ENCOUNTER — Encounter: Payer: Self-pay | Admitting: Cardiology

## 2016-06-23 VITALS — BP 111/79 | HR 96 | Ht 69.0 in | Wt 227.0 lb

## 2016-06-23 DIAGNOSIS — I482 Chronic atrial fibrillation, unspecified: Secondary | ICD-10-CM

## 2016-06-23 DIAGNOSIS — I10 Essential (primary) hypertension: Secondary | ICD-10-CM | POA: Diagnosis not present

## 2016-06-23 MED ORDER — METOPROLOL TARTRATE 25 MG PO TABS: 25.0000 mg | ORAL_TABLET | Freq: Three times a day (TID) | ORAL | 3 refills | Status: DC

## 2016-06-23 NOTE — Progress Notes (Signed)
Cardiology Office Note  Date: 06/23/2016   ID: Nicholas [name], DOB [birth year], MRN [id number]  PCP: [name], MD  Primary Cardiologist: [name], MD   Chief Complaint  Patient presents with  . Atrial Fibrillation    History of Present Illness: Nicholas [name] is an 82 y.o. male last seen in June. He presents for a routine follow-up visit. Complains of being chronically fatigued, does not feel palpitations or heart racing. He has had no exertional chest pain.  He continues on Coumadin and follows with [name]. Reports no bleeding problems.  Over time we have cut back his beta blocker dose. Resting heart rate in the 80s today, was in the mid 90s when he came into the room. We talked about increasing the dose of Lopressor slightly to 25 mg 3 times a day to see if this made him feel any better.  Past Medical History:  Diagnosis Date  . Atrial fibrillation ([place])   . Cervical spondylosis without myelopathy   . COPD (chronic obstructive pulmonary disease) ([place])   . Diverticulosis of colon (without mention of hemorrhage)   . Esophageal reflux   . Essential hypertension, benign   . Malignant neoplasm of prostate ([place])   . Obstructive sleep apnea   . Osteoarthritis     Current Outpatient Prescriptions  Medication Sig Dispense Refill  . albuterol-ipratropium (COMBIVENT) 18-103 MCG/ACT inhaler Inhale 2 puffs into the lungs every 6 (six) hours as needed (uses rarely). UAD PRN    . allopurinol (ZYLOPRIM) 300 MG tablet Take 300 mg by mouth daily.      [name] amoxicillin-clavulanate (AUGMENTIN) 500-125 MG tablet Take 1 tablet by mouth 2 (two) times daily.    . Cholecalciferol (VITAMIN D3) 1000 UNITS tablet Take 1,000 Units by mouth daily.      . Cyanocobalamin (VITAMIN B-12 IJ) Inject as directed every 30 (thirty) days.    . fluticasone (FLONASE) 50 MCG/ACT nasal spray Place 1 spray into both nostrils daily as needed for allergies or rhinitis.    [name] HYDROcodone-acetaminophen  (NORCO/VICODIN) 5-325 MG tablet Take 1 tablet by mouth every 6 (six) hours as needed for moderate pain.    . hydroxypropyl methylcellulose / hypromellose (ISOPTO TEARS / GONIOVISC) 2.5 % ophthalmic solution Place 1 drop into both eyes daily as needed for dry eyes.    . metoprolol tartrate (LOPRESSOR) 25 MG tablet Take 1 tablet (25 mg total) by mouth 3 (three) times daily. 270 tablet 3  . Multiple Vitamins-Minerals (PRESERVISION AREDS) CAPS Take 1 capsule by mouth 2 (two) times daily.     . ondansetron (ZOFRAN) 4 MG tablet TAKE 1 TABLET EVERY 8 HOURS AS NEEDED FOR NAUSEA OR VOMITING 60 tablet 1  . pantoprazole (PROTONIX) 40 MG tablet Take 40 mg by mouth daily.    . simethicone (MYLICON) 0000000 MG chewable tablet Chew 125 mg by mouth every 6 (six) hours as needed for flatulence.    . traMADol (ULTRAM) 50 MG tablet Take 1 tablet by mouth 2 (two) times daily as needed.    [name] VIAGRA 100 MG tablet Take 1 tablet by mouth daily as needed.    . warfarin (COUMADIN) 5 MG tablet Take 2.5-5 mg by mouth See admin instructions. Take 5 mg on Mondays . Take 2.5 mg all other days.  UAD - managed by [name]     No current facility-administered medications for this visit.    Allergies:  Sulfonamide derivatives   Social History: The patient  reports that he  quit smoking about 51 years ago. His smoking use included Cigarettes. He started smoking about 69 years ago. He has a 18.00 pack-year smoking history. He quit smokeless tobacco use about 49 years ago. His smokeless tobacco use included Chew. He reports that he does not drink alcohol or use drugs.   ROS:  Please see the history of present illness. Otherwise, complete review of systems is positive for arthritis.  All other systems are reviewed and negative.   Physical Exam: VS:  BP 111/79   Pulse 96   Ht 5\' 9"  (1.753 m)   Wt 227 lb (103 kg)   BMI 33.52 kg/m , BMI Body mass index is 33.52 kg/m.  Wt Readings from Last 3 Encounters:  06/23/16 227 lb (103 kg)    05/27/16 229 lb 8 oz (104.1 kg)  02/25/16 230 lb (104.3 kg)    Obese male in no acute distress.  HEENT: Conjunctiva and lids normal, oropharynx with moist mucosa.  Neck: Supple, no elevated JVP.  Lungs: Clear to auscultation, diminished, no wheezing.  Cardiac: Irregularly irregular, no S3 or rub.  Abdomen: Nontender, bowel sounds present,.  Extremities: No pitting edema. Distal pulses are 2+.  ECG: I personally reviewed the tracing from 12/04/2015 which showed rate-controlled atrial fibrillation.  Recent Labwork:  April 2017: BUN 12, creatinine 0.9, potassium 4.8, AST 15, ALT 7, hemoglobin 12.6, platelets 183 , TSH 2.4, cholesterol 127, triglycerides 58, HDL 55, LDL 60   Other Studies Reviewed Today:  Echocardiogram 08/06/2015: Study Conclusions  - Left ventricle: The cavity size was normal. Wall thickness was  increased in a pattern of mild LVH. Systolic function was normal.  The estimated ejection fraction was in the range of 60% to 65%.  Wall motion was normal; there were no regional wall motion  abnormalities. The study is not technically sufficient to allow  evaluation of LV diastolic function. - Aortic valve: There was trivial regurgitation. - Aortic root: The aortic root was mildly ectatic. - Mitral valve: Calcified annulus. There was trivial regurgitation. - Left atrium: The atrium was moderately to severely dilated. - Right ventricle: The cavity size was mildly to moderately  dilated. Systolic function was normal. - Right atrium: The atrium was moderately to severely dilated.  Central venous pressure (est): 3 mm Hg. - Atrial septum: No defect or patent foramen ovale was identified. - Tricuspid valve: There was trivial regurgitation. - Pulmonary arteries: PA peak pressure: 17 mm Hg (S). - Pericardium, extracardiac: There was no pericardial effusion.  Impressions:  - Mild LVH with LVEF 60-65%. Indeterminate diastolic function.  Moderate to severe  biatrial enlargement. MAC with trivial mitral  regurgitation. Mildly ectatic aortic root. Sclerotic aortic valve  with trivial aortic regurgitation. Mildly to moderately dilated  RV with normal contraction. Trivial tricuspid regurgitation with  PASP estimated 17 mmHg.  Assessment and Plan:  1. Chronic atrial fibrillation. Continue Coumadin and increase Lopressor to 25 mg 3 times a day.  2. Essential hypertension, blood pressure is well controlled today.  Current medicines were reviewed with the patient today.  Disposition: Follow-up in 6 months.  Signed, Satira Sark, MD, [facility] 06/23/2016 10:59 AM    [facility] [place], [place], [place] [address] Phone: [phone]; Fax: [phone]

## 2016-06-23 NOTE — Patient Instructions (Addendum)
Medication Instructions:   Increase Lopressor to 25mg  three times per day.  Continue all other current medications.    Labwork: none  Testing/Procedures: none  Follow-Up: Your physician wants you to follow up in: 6 months.  You will receive a reminder letter in the mail one-two months in advance.  If you don't receive a letter, please call our office to schedule the follow up appointment   Any Other Special Instructions Will Be Listed Below (If Applicable).  If you need a refill on your cardiac medications before your next appointment, please call your pharmacy.

## 2016-07-01 DIAGNOSIS — R5383 Other fatigue: Secondary | ICD-10-CM | POA: Diagnosis not present

## 2016-07-01 DIAGNOSIS — J449 Chronic obstructive pulmonary disease, unspecified: Secondary | ICD-10-CM | POA: Diagnosis not present

## 2016-07-01 DIAGNOSIS — Z6835 Body mass index (BMI) 35.0-35.9, adult: Secondary | ICD-10-CM | POA: Diagnosis not present

## 2016-07-01 DIAGNOSIS — I482 Chronic atrial fibrillation: Secondary | ICD-10-CM | POA: Diagnosis not present

## 2016-07-01 DIAGNOSIS — Z299 Encounter for prophylactic measures, unspecified: Secondary | ICD-10-CM | POA: Diagnosis not present

## 2016-07-01 DIAGNOSIS — Z87891 Personal history of nicotine dependence: Secondary | ICD-10-CM | POA: Diagnosis not present

## 2016-07-01 DIAGNOSIS — I5030 Unspecified diastolic (congestive) heart failure: Secondary | ICD-10-CM | POA: Diagnosis not present

## 2016-07-02 DIAGNOSIS — E538 Deficiency of other specified B group vitamins: Secondary | ICD-10-CM | POA: Diagnosis not present

## 2016-07-02 DIAGNOSIS — J449 Chronic obstructive pulmonary disease, unspecified: Secondary | ICD-10-CM | POA: Diagnosis not present

## 2016-07-02 DIAGNOSIS — I482 Chronic atrial fibrillation: Secondary | ICD-10-CM | POA: Diagnosis not present

## 2016-07-02 DIAGNOSIS — R5383 Other fatigue: Secondary | ICD-10-CM | POA: Diagnosis not present

## 2016-07-02 DIAGNOSIS — Z299 Encounter for prophylactic measures, unspecified: Secondary | ICD-10-CM | POA: Diagnosis not present

## 2016-07-06 DIAGNOSIS — H353131 Nonexudative age-related macular degeneration, bilateral, early dry stage: Secondary | ICD-10-CM | POA: Diagnosis not present

## 2016-07-28 DIAGNOSIS — M79672 Pain in left foot: Secondary | ICD-10-CM | POA: Diagnosis not present

## 2016-07-28 DIAGNOSIS — M79671 Pain in right foot: Secondary | ICD-10-CM | POA: Diagnosis not present

## 2016-07-28 DIAGNOSIS — S93332D Other subluxation of left foot, subsequent encounter: Secondary | ICD-10-CM | POA: Diagnosis not present

## 2016-07-28 DIAGNOSIS — S93331D Other subluxation of right foot, subsequent encounter: Secondary | ICD-10-CM | POA: Diagnosis not present

## 2016-08-04 ENCOUNTER — Encounter (HOSPITAL_COMMUNITY): Payer: Self-pay | Admitting: *Deleted

## 2016-08-04 ENCOUNTER — Encounter (HOSPITAL_COMMUNITY)
Admission: RE | Disposition: A | Discharge status: Home / Self Care | Payer: Self-pay | Source: Ambulatory Visit | Attending: Internal Medicine

## 2016-08-04 ENCOUNTER — Ambulatory Visit (HOSPITAL_COMMUNITY)
Admission: RE | Admit: 2016-08-04 | Discharge: 2016-08-04 | Disposition: A | Discharge status: Home / Self Care | Payer: Medicare Other | Source: Ambulatory Visit | Attending: Internal Medicine | Admitting: Internal Medicine

## 2016-08-04 DIAGNOSIS — I10 Essential (primary) hypertension: Secondary | ICD-10-CM | POA: Diagnosis not present

## 2016-08-04 DIAGNOSIS — J449 Chronic obstructive pulmonary disease, unspecified: Secondary | ICD-10-CM | POA: Diagnosis not present

## 2016-08-04 DIAGNOSIS — Z8546 Personal history of malignant neoplasm of prostate: Secondary | ICD-10-CM | POA: Diagnosis not present

## 2016-08-04 DIAGNOSIS — Z79899 Other long term (current) drug therapy: Secondary | ICD-10-CM | POA: Insufficient documentation

## 2016-08-04 DIAGNOSIS — R1310 Dysphagia, unspecified: Secondary | ICD-10-CM

## 2016-08-04 DIAGNOSIS — Z87891 Personal history of nicotine dependence: Secondary | ICD-10-CM | POA: Insufficient documentation

## 2016-08-04 DIAGNOSIS — Z7901 Long term (current) use of anticoagulants: Secondary | ICD-10-CM | POA: Diagnosis not present

## 2016-08-04 DIAGNOSIS — K317 Polyp of stomach and duodenum: Secondary | ICD-10-CM | POA: Insufficient documentation

## 2016-08-04 DIAGNOSIS — G4733 Obstructive sleep apnea (adult) (pediatric): Secondary | ICD-10-CM | POA: Diagnosis not present

## 2016-08-04 DIAGNOSIS — R1314 Dysphagia, pharyngoesophageal phase: Secondary | ICD-10-CM | POA: Diagnosis not present

## 2016-08-04 DIAGNOSIS — K219 Gastro-esophageal reflux disease without esophagitis: Secondary | ICD-10-CM | POA: Diagnosis not present

## 2016-08-04 DIAGNOSIS — M47812 Spondylosis without myelopathy or radiculopathy, cervical region: Secondary | ICD-10-CM | POA: Insufficient documentation

## 2016-08-04 DIAGNOSIS — I4891 Unspecified atrial fibrillation: Secondary | ICD-10-CM | POA: Insufficient documentation

## 2016-08-04 DIAGNOSIS — R1319 Other dysphagia: Secondary | ICD-10-CM | POA: Insufficient documentation

## 2016-08-04 HISTORY — PX: ESOPHAGEAL DILATION: SHX303

## 2016-08-04 HISTORY — PX: ESOPHAGOGASTRODUODENOSCOPY: SHX5428

## 2016-08-04 SURGERY — EGD (ESOPHAGOGASTRODUODENOSCOPY)
Anesthesia: Moderate Sedation

## 2016-08-04 MED ORDER — MEPERIDINE HCL 50 MG/ML IJ SOLN
INTRAMUSCULAR | Status: AC
  Filled 2016-08-04: qty 1

## 2016-08-04 MED ORDER — MIDAZOLAM HCL 5 MG/5ML IJ SOLN
INTRAMUSCULAR | Status: AC
  Filled 2016-08-04: qty 10

## 2016-08-04 MED ORDER — MEPERIDINE HCL 50 MG/ML IJ SOLN
INTRAMUSCULAR | Status: DC | PRN
  Administered 2016-08-04: 25 mg via INTRAVENOUS

## 2016-08-04 MED ORDER — MIDAZOLAM HCL 5 MG/5ML IJ SOLN
INTRAMUSCULAR | Status: DC | PRN
  Administered 2016-08-04: 1 mg via INTRAVENOUS
  Administered 2016-08-04: 2 mg via INTRAVENOUS
  Administered 2016-08-04: 1 mg via INTRAVENOUS

## 2016-08-04 MED ORDER — SODIUM CHLORIDE 0.9 % IV SOLN
INTRAVENOUS | Status: DC
  Administered 2016-08-04: 11:00:00 via INTRAVENOUS

## 2016-08-04 NOTE — Discharge Instructions (Signed)
No aspirin or NSAIDs for 1 week. Resume warfarin at usual dose starting this evening. INR check in 7-10 days. Resume other medications and diet as before. Simethicone 1 tablet twice daily for 2 weeks and that after as needed. No driving for 24 hours. Physician will call with biopsy results.   Upper Endoscopy, Care After Refer to this sheet in the next few weeks. These instructions provide you with information about caring for yourself after your procedure. Your health care provider may also give you more specific instructions. Your treatment has been planned according to current medical practices, but problems sometimes occur. Call your health care provider if you have any problems or questions after your procedure. What can I expect after the procedure? After the procedure, it is common to have:  A sore throat.  Bloating.  Nausea. Follow these instructions at home:  Follow instructions from your health care provider about what to eat or drink after your procedure.  Return to your normal activities as told by your health care provider. Ask your health care provider what activities are safe for you.  Take over-the-counter and prescription medicines only as told by your health care provider.  Do not drive for 24 hours if you received a sedative.  Keep all follow-up visits as told by your health care provider. This is important. Contact a health care provider if:  You have a sore throat that lasts longer than one day.  You have trouble swallowing. Get help right away if:  You have a fever.  You vomit blood or your vomit looks like coffee grounds.  You have bloody, black, or tarry stools.  You have a severe sore throat or you cannot swallow.  You have difficulty breathing.  You have severe pain in your chest or belly. This information is not intended to replace advice given to you by your health care provider. Make sure you discuss any questions you have with your health  care provider. Document Released: 12/14/2011 Document Revised: 11/20/2015 Document Reviewed: 03/27/2015 Elsevier Interactive Patient Education  2017 [employer].

## 2016-08-04 NOTE — Op Note (Signed)
[facility] Patient Name: Nicholas [name] Procedure Date: 08/04/2016 11:10 AM MRN: [id number] Date of Birth: 01/02/1929 Attending MD: Hildred Laser , MD CSN: [id number] Age: 83 Admit Type: Outpatient Procedure:                Upper GI endoscopy Indications:              Esophageal dysphagia Providers:                Hildred Laser, MD, Charlyne Petrin RN, RN, Sherlyn Lees, Technician Referring MD:             [name], MD Medicines:                Cetacaine spray, Meperidine 25 mg IV, Midazolam 4                            mg IV Complications:            No immediate complications. Estimated Blood Loss:     Estimated blood loss: none. Estimated blood loss:                            none. Estimated blood loss: none. Procedure:                Pre-Anesthesia Assessment:                           - Prior to the procedure, a History and Physical                            was performed, and patient medications and                            allergies were reviewed. The patient's tolerance of                            previous anesthesia was also reviewed. The risks                            and benefits of the procedure and the sedation                            options and risks were discussed with the patient.                            All questions were answered, and informed consent                            was obtained. Prior Anticoagulants: The patient                            last took Coumadin (warfarin) 6 days prior to the  procedure. ASA Grade Assessment: II - A patient                            with mild systemic disease. After reviewing the                            risks and benefits, the patient was deemed in                            satisfactory condition to undergo the procedure.                           After obtaining informed consent, the endoscope was                            passed under direct  vision. Throughout the                            procedure, the patient's blood pressure, pulse, and                            oxygen saturations were monitored continuously. The                            [id number]) scope was introduced through the                            mouth, and advanced to the second part of duodenum.                            The upper GI endoscopy was accomplished without                            difficulty. The patient tolerated the procedure                            well. Scope In: 11:23:52 AM Scope Out: 11:38:52 AM Total Procedure Duration: 0 hours 15 minutes 0 seconds  Findings:      The examined esophagus was normal.      The Z-line was regular and was found 45 cm from the incisors.      No endoscopic abnormality was evident in the esophagus to explain the       patient's complaint of dysphagia. It was decided, however, to proceed       with dilation of the entire esophagus. A guidewire was placed and the       scope was withdrawn. Dilation was performed with a Savary dilator with       no resistance at 54 Fr.      A few 3 to 6 mm pedunculated and sessile polyps were found in the       gastric fundus and in the gastric body.      A single 12 mm semi-sessile polyp with no bleeding and no stigmata of       recent bleeding was found in the gastric antrum. The polyp was  removed       with a hot snare. Resection and retrieval were complete. To prevent       bleeding after the polypectomy, one hemostatic clip was successfully       placed (MR conditional). There was no bleeding during, or at the end, of       the procedure.      The exam of the stomach was otherwise normal.      The duodenal bulb and second portion of the duodenum were normal. Impression:               - Normal esophagus.                           - Z-line regular, 45 cm from the incisors.                           - No endoscopic esophageal abnormality to explain                             patient's dysphagia. Esophagus dilated. Dilated.                           - few small polyps at gastric fundus and body.                            These were left alone.                           - 12 mm olive snared from gastric antrum. Single                            360 clip applied to polypectomy site.                           - No specimens collected. Moderate Sedation:      Moderate (conscious) sedation was administered by the endoscopy nurse       and supervised by the endoscopist. The following parameters were       monitored: oxygen saturation, heart rate, blood pressure, CO2       capnography and response to care. Total physician intraservice time was       21 minutes. Recommendation:           - Patient has a contact number available for                            emergencies. The signs and symptoms of potential                            delayed complications were discussed with the                            patient. Return to normal activities tomorrow.                            Written discharge instructions were provided to the  patient.                           - Resume previous diet today.                           - Continue present medications.                           - Resume Coumadin (warfarin) at prior dose today.                            Refer to Coumadin Clinic for further adjustment of                            therapy.                           - No aspirin, ibuprofen, naproxen, or other                            non-steroidal anti-inflammatory drugs for 7 days                            after polyp removal.                           - Await pathology results. Procedure Code(s):        --- Professional ---                           [phone], Esophagogastroduodenoscopy, flexible,                            transoral; with removal of tumor(s), polyp(s), or                            other lesion(s) by snare  technique                           43248, Esophagogastroduodenoscopy, flexible,                            transoral; with insertion of guide wire followed by                            passage of dilator(s) through esophagus over guide                            wire                           99152, Moderate sedation services provided by the                            same physician or other qualified health care  professional performing the diagnostic or                            therapeutic service that the sedation supports,                            requiring the presence of an independent trained                            observer to assist in the monitoring of the                            patient's level of consciousness and physiological                            status; initial 15 minutes of intraservice time,                            patient age 31 years or older Diagnosis Code(s):        --- Professional ---                           K31.7, Polyp of stomach and duodenum CPT copyright 2016 American Medical Association. All rights reserved. The codes documented in this report are preliminary and upon coder review may  be revised to meet current compliance requirements. Hildred Laser, MD Hildred Laser, MD 08/04/2016 11:52:46 AM This report has been signed electronically. Number of Addenda: 0

## 2016-08-04 NOTE — H&P (Signed)
Nicholas [name] is an 83 y.o. male.   Chief Complaint: Patient is here for EGD and ED. HPI: Patient is 83 year old Caucasian male was chronic GERD and possible esophageal motility disorder who is here for solid food dysphagia. He's been having this for about a year. He underwent esophageal dilation in December 2014 which provided relief for several months. He states food bolus passes within 2-3 minutes. Heartburn is well controlled with therapy. He also complains of bloating and right upper quadrant pain which she describes as gas pain. He states she's had these symptoms since he had gallbladder removed. He denies melena or rectal bleeding or diarrhea. He has lost few pounds because I cut back on food intake because of his symptoms. He points to lower sternal areas site of bolus obstruction. Last warfarin dose was 6 days ago.  Past Medical History:  Diagnosis Date  . Atrial fibrillation ([place])   . Cervical spondylosis without myelopathy   . COPD (chronic obstructive pulmonary disease) ([place])   . Diverticulosis of colon (without mention of hemorrhage)   . Esophageal reflux   . Essential hypertension, benign   . Malignant neoplasm of prostate ([place])   . Obstructive sleep apnea   . Osteoarthritis     Past Surgical History:  Procedure Laterality Date  . CHOLECYSTECTOMY N/A 07/03/2014   Procedure: LAPAROSCOPIC CHOLECYSTECTOMY;  Surgeon: [name], MD;  Location: [facility] ORS;  Service: General;  Laterality: N/A;  . COLONOSCOPY    . COLONOSCOPY N/A 12/18/2015   Procedure: COLONOSCOPY;  Surgeon: [name], MD;  Location: [facility] ENDO SUITE;  Service: Endoscopy;  Laterality: N/A;  1200  . ESOPHAGOGASTRODUODENOSCOPY (EGD) WITH ESOPHAGEAL DILATION N/A 06/18/2013   Procedure: ESOPHAGOGASTRODUODENOSCOPY (EGD) WITH ESOPHAGEAL DILATION;  Surgeon: [name], MD;  Location: [facility] ENDO SUITE;  Service: Endoscopy;  Laterality: N/A;  220-rescheduled to 730 Ann to notify pt  . ROTATOR CUFF REPAIR    . TOTAL KNEE  ARTHROPLASTY     2000, 2005.     Family History  Problem Relation Age of Onset  . Hypertension Father   . Heart attack Maternal Uncle    Social History:  reports that he quit smoking about 51 years ago. His smoking use included Cigarettes. He started smoking about 69 years ago. He has a 18.00 pack-year smoking history. He quit smokeless tobacco use about 49 years ago. His smokeless tobacco use included Chew. He reports that he does not drink alcohol or use drugs.  Allergies:  Allergies  Allergen Reactions  . Sulfonamide Derivatives Rash    Medications Prior to Admission  Medication Sig Dispense Refill  . albuterol-ipratropium (COMBIVENT) 18-103 MCG/ACT inhaler Inhale 2 puffs into the lungs every 6 (six) hours as needed for wheezing or shortness of breath (uses rarely). UAD PRN    . allopurinol (ZYLOPRIM) 300 MG tablet Take 300 mg by mouth daily.      . Cholecalciferol (VITAMIN D3) 1000 UNITS tablet Take 1,000 Units by mouth daily.      . cyanocobalamin (,VITAMIN B-12,) 1000 MCG/ML injection Inject 1,000 mcg into the muscle every 30 (thirty) days.    . fluticasone (FLONASE) 50 MCG/ACT nasal spray Place 1 spray into both nostrils daily as needed for allergies or rhinitis.    [name] HYDROcodone-acetaminophen (NORCO/VICODIN) 5-325 MG tablet Take 1 tablet by mouth every 6 (six) hours as needed for moderate pain.    . hydroxypropyl methylcellulose / hypromellose (ISOPTO TEARS / GONIOVISC) 2.5 % ophthalmic solution Place 1 drop into both eyes  3 (three) times daily as needed for dry eyes.     . metoprolol tartrate (LOPRESSOR) 25 MG tablet Take 1 tablet (25 mg total) by mouth 3 (three) times daily. 270 tablet 3  . Multiple Vitamins-Minerals (PRESERVISION AREDS) CAPS Take 1 capsule by mouth 2 (two) times daily.     . ondansetron (ZOFRAN) 4 MG tablet TAKE 1 TABLET EVERY 8 HOURS AS NEEDED FOR NAUSEA OR VOMITING 60 tablet 1  . pantoprazole (PROTONIX) 40 MG tablet Take 40 mg by mouth daily before  breakfast.     . simethicone (MYLICON) 0000000 MG chewable tablet Chew 125 mg by mouth every 6 (six) hours as needed for flatulence.    . sodium chloride (OCEAN) 0.65 % SOLN nasal spray Place 1 spray into both nostrils 3 (three) times daily as needed for congestion.    [name] VIAGRA 100 MG tablet Take 100 mg by mouth daily as needed for erectile dysfunction.     [name] warfarin (COUMADIN) 5 MG tablet Take 2.5-5 mg by mouth See admin instructions. Take 5 mg on Mondays . Take 2.5 mg all other days.  UAD - managed by [name]      No results found for this or any previous visit (from the past 48 hour(s)). No results found.  ROS  Blood pressure 129/75, pulse 74, temperature 98.7 F (37.1 C), temperature source Oral, resp. rate (!) 8, height 5\' 9"  (1.753 m), weight 227 lb (103 kg), SpO2 98 %. Physical Exam  Constitutional: He appears well-developed and well-nourished.  HENT:  Mouth/Throat: Oropharynx is clear and moist.  Eyes: Conjunctivae are normal. No scleral icterus.  Neck: No thyromegaly present.  Cardiovascular:  Irregular rhythm normal S1 and S2. No murmur or gallop noted.  Respiratory: Effort normal and breath sounds normal.  GI: Soft. He exhibits no distension and no mass. There is no tenderness.  Musculoskeletal: He exhibits no edema.  Lymphadenopathy:    He has no cervical adenopathy.  Neurological: He is alert.  Skin: Skin is warm and dry.     Assessment/Plan Solid food dysphagia in patient with chronic GERD. Bloating and right upper quadrant abdominal pain. EGD with ED.  Hildred Laser, MD 08/04/2016, 11:11 AM

## 2016-08-05 ENCOUNTER — Encounter (HOSPITAL_COMMUNITY): Payer: Self-pay | Admitting: Internal Medicine

## 2016-08-11 DIAGNOSIS — I10 Essential (primary) hypertension: Secondary | ICD-10-CM | POA: Diagnosis not present

## 2016-08-11 DIAGNOSIS — J449 Chronic obstructive pulmonary disease, unspecified: Secondary | ICD-10-CM | POA: Diagnosis not present

## 2016-08-11 DIAGNOSIS — K219 Gastro-esophageal reflux disease without esophagitis: Secondary | ICD-10-CM | POA: Diagnosis not present

## 2016-08-11 DIAGNOSIS — I5030 Unspecified diastolic (congestive) heart failure: Secondary | ICD-10-CM | POA: Diagnosis not present

## 2016-08-11 DIAGNOSIS — Z299 Encounter for prophylactic measures, unspecified: Secondary | ICD-10-CM | POA: Diagnosis not present

## 2016-08-11 DIAGNOSIS — N400 Enlarged prostate without lower urinary tract symptoms: Secondary | ICD-10-CM | POA: Diagnosis not present

## 2016-08-11 DIAGNOSIS — F329 Major depressive disorder, single episode, unspecified: Secondary | ICD-10-CM | POA: Diagnosis not present

## 2016-08-11 DIAGNOSIS — E538 Deficiency of other specified B group vitamins: Secondary | ICD-10-CM | POA: Diagnosis not present

## 2016-08-11 DIAGNOSIS — G4733 Obstructive sleep apnea (adult) (pediatric): Secondary | ICD-10-CM | POA: Diagnosis not present

## 2016-08-11 DIAGNOSIS — I482 Chronic atrial fibrillation: Secondary | ICD-10-CM | POA: Diagnosis not present

## 2016-08-11 DIAGNOSIS — Z87891 Personal history of nicotine dependence: Secondary | ICD-10-CM | POA: Diagnosis not present

## 2016-08-20 DIAGNOSIS — M25552 Pain in left hip: Secondary | ICD-10-CM | POA: Diagnosis not present

## 2016-08-20 DIAGNOSIS — M25562 Pain in left knee: Secondary | ICD-10-CM | POA: Diagnosis not present

## 2016-08-20 DIAGNOSIS — M25561 Pain in right knee: Secondary | ICD-10-CM | POA: Diagnosis not present

## 2016-08-24 ENCOUNTER — Telehealth (INDEPENDENT_AMBULATORY_CARE_PROVIDER_SITE_OTHER): Payer: Self-pay | Admitting: Internal Medicine

## 2016-08-24 NOTE — Telephone Encounter (Signed)
Patient called, stated that Dr. Laural Golden did a procedure about two weeks ago.  He prescribed Simethicone and it's not helping and he is still very nauseated.  [phone]

## 2016-08-25 NOTE — Telephone Encounter (Signed)
Dr.Rehman - patient would like to know if there is something else that can be called in ? The Simethicone is not helping and he is still nauseous.

## 2016-08-26 NOTE — Telephone Encounter (Signed)
Will try him on metronidazole 250 mg by mouth 3 times a day for 10 days. He would need to call office with progress report. He is called patient and prescription.

## 2016-08-27 DIAGNOSIS — R51 Headache: Secondary | ICD-10-CM | POA: Diagnosis not present

## 2016-08-27 DIAGNOSIS — I4891 Unspecified atrial fibrillation: Secondary | ICD-10-CM | POA: Diagnosis not present

## 2016-08-27 DIAGNOSIS — R509 Fever, unspecified: Secondary | ICD-10-CM | POA: Diagnosis not present

## 2016-08-27 DIAGNOSIS — J101 Influenza due to other identified influenza virus with other respiratory manifestations: Secondary | ICD-10-CM | POA: Diagnosis not present

## 2016-08-27 NOTE — Telephone Encounter (Signed)
Rx was called to [facility]. Patient was called and made aware.

## 2016-08-28 DIAGNOSIS — J449 Chronic obstructive pulmonary disease, unspecified: Secondary | ICD-10-CM | POA: Diagnosis not present

## 2016-08-28 DIAGNOSIS — J101 Influenza due to other identified influenza virus with other respiratory manifestations: Secondary | ICD-10-CM | POA: Diagnosis not present

## 2016-08-28 DIAGNOSIS — J1089 Influenza due to other identified influenza virus with other manifestations: Secondary | ICD-10-CM | POA: Diagnosis not present

## 2016-08-28 DIAGNOSIS — R51 Headache: Secondary | ICD-10-CM | POA: Diagnosis not present

## 2016-08-28 DIAGNOSIS — K5730 Diverticulosis of large intestine without perforation or abscess without bleeding: Secondary | ICD-10-CM | POA: Diagnosis not present

## 2016-08-28 DIAGNOSIS — Z7901 Long term (current) use of anticoagulants: Secondary | ICD-10-CM | POA: Diagnosis not present

## 2016-08-28 DIAGNOSIS — I4891 Unspecified atrial fibrillation: Secondary | ICD-10-CM | POA: Diagnosis not present

## 2016-08-28 DIAGNOSIS — Z87891 Personal history of nicotine dependence: Secondary | ICD-10-CM | POA: Diagnosis not present

## 2016-08-28 DIAGNOSIS — Z882 Allergy status to sulfonamides status: Secondary | ICD-10-CM | POA: Diagnosis not present

## 2016-08-28 DIAGNOSIS — M1990 Unspecified osteoarthritis, unspecified site: Secondary | ICD-10-CM | POA: Diagnosis not present

## 2016-08-28 DIAGNOSIS — Z8546 Personal history of malignant neoplasm of prostate: Secondary | ICD-10-CM | POA: Diagnosis not present

## 2016-08-28 DIAGNOSIS — Z79899 Other long term (current) drug therapy: Secondary | ICD-10-CM | POA: Diagnosis not present

## 2016-08-28 DIAGNOSIS — K219 Gastro-esophageal reflux disease without esophagitis: Secondary | ICD-10-CM | POA: Diagnosis not present

## 2016-08-28 DIAGNOSIS — R509 Fever, unspecified: Secondary | ICD-10-CM | POA: Diagnosis not present

## 2016-08-28 DIAGNOSIS — Z96653 Presence of artificial knee joint, bilateral: Secondary | ICD-10-CM | POA: Diagnosis not present

## 2016-08-28 DIAGNOSIS — Z923 Personal history of irradiation: Secondary | ICD-10-CM | POA: Diagnosis not present

## 2016-08-28 DIAGNOSIS — M109 Gout, unspecified: Secondary | ICD-10-CM | POA: Diagnosis not present

## 2016-08-29 DIAGNOSIS — R51 Headache: Secondary | ICD-10-CM | POA: Diagnosis not present

## 2016-08-29 DIAGNOSIS — R509 Fever, unspecified: Secondary | ICD-10-CM | POA: Diagnosis not present

## 2016-08-29 DIAGNOSIS — I4891 Unspecified atrial fibrillation: Secondary | ICD-10-CM | POA: Diagnosis not present

## 2016-08-29 DIAGNOSIS — J101 Influenza due to other identified influenza virus with other respiratory manifestations: Secondary | ICD-10-CM | POA: Diagnosis not present

## 2016-08-30 ENCOUNTER — Telehealth (INDEPENDENT_AMBULATORY_CARE_PROVIDER_SITE_OTHER): Payer: Self-pay | Admitting: Internal Medicine

## 2016-08-30 DIAGNOSIS — I4891 Unspecified atrial fibrillation: Secondary | ICD-10-CM | POA: Diagnosis not present

## 2016-08-30 DIAGNOSIS — R51 Headache: Secondary | ICD-10-CM | POA: Diagnosis not present

## 2016-08-30 DIAGNOSIS — J101 Influenza due to other identified influenza virus with other respiratory manifestations: Secondary | ICD-10-CM | POA: Diagnosis not present

## 2016-08-30 DIAGNOSIS — R509 Fever, unspecified: Secondary | ICD-10-CM | POA: Diagnosis not present

## 2016-08-30 NOTE — Telephone Encounter (Signed)
[place] advised the patient to check with you on how to take the Metronidazole with the Coumadin. Please Advise , thank you.

## 2016-08-30 NOTE — Telephone Encounter (Signed)
This will be addressed with Dr.Rehman. 

## 2016-08-30 NOTE — Telephone Encounter (Signed)
Patient left a message after hours on Friday, 08/27/16.  He stated that he picked up a script from [facility] and the pharmacist there said that he should call our office regarding the medication because of the affects it has on coumadin which he is taking.  [phone]

## 2016-08-31 DIAGNOSIS — I4891 Unspecified atrial fibrillation: Secondary | ICD-10-CM | POA: Diagnosis not present

## 2016-08-31 DIAGNOSIS — R509 Fever, unspecified: Secondary | ICD-10-CM | POA: Diagnosis not present

## 2016-08-31 DIAGNOSIS — J101 Influenza due to other identified influenza virus with other respiratory manifestations: Secondary | ICD-10-CM | POA: Diagnosis not present

## 2016-08-31 DIAGNOSIS — R51 Headache: Secondary | ICD-10-CM | POA: Diagnosis not present

## 2016-08-31 NOTE — Telephone Encounter (Signed)
Patient will need to use warfarin dose to half of his standard dose. Check INR in 5 or 6 days.

## 2016-09-01 NOTE — Telephone Encounter (Signed)
Patient states that after his call on Friday , he came down with the Flu. [facility] was going to hold the Rx for the Flagyl until this Monday. Patient says that he takes 5 mg of Coumadin on Monday , then he takes 2.5 mg the rest of the week. This is handled by Dr.Shaw. Patient's question is will he take 2.5 mg of coumadin on Monday , then half of the 2.5 mg the rest of the week? Or if Dr.Rehman wanted to treat him with something else other than the Flagyl?

## 2016-09-03 DIAGNOSIS — Z8546 Personal history of malignant neoplasm of prostate: Secondary | ICD-10-CM | POA: Diagnosis not present

## 2016-09-03 DIAGNOSIS — I509 Heart failure, unspecified: Secondary | ICD-10-CM | POA: Diagnosis not present

## 2016-09-03 DIAGNOSIS — Z87891 Personal history of nicotine dependence: Secondary | ICD-10-CM | POA: Diagnosis not present

## 2016-09-03 DIAGNOSIS — J45909 Unspecified asthma, uncomplicated: Secondary | ICD-10-CM | POA: Diagnosis not present

## 2016-09-03 DIAGNOSIS — Z7901 Long term (current) use of anticoagulants: Secondary | ICD-10-CM | POA: Diagnosis not present

## 2016-09-03 DIAGNOSIS — K219 Gastro-esophageal reflux disease without esophagitis: Secondary | ICD-10-CM | POA: Diagnosis not present

## 2016-09-03 DIAGNOSIS — Z923 Personal history of irradiation: Secondary | ICD-10-CM | POA: Diagnosis not present

## 2016-09-03 DIAGNOSIS — M1990 Unspecified osteoarthritis, unspecified site: Secondary | ICD-10-CM | POA: Diagnosis not present

## 2016-09-03 DIAGNOSIS — I4891 Unspecified atrial fibrillation: Secondary | ICD-10-CM | POA: Diagnosis not present

## 2016-09-03 DIAGNOSIS — J09X2 Influenza due to identified novel influenza A virus with other respiratory manifestations: Secondary | ICD-10-CM | POA: Diagnosis not present

## 2016-09-03 DIAGNOSIS — M109 Gout, unspecified: Secondary | ICD-10-CM | POA: Diagnosis not present

## 2016-09-07 DIAGNOSIS — J45909 Unspecified asthma, uncomplicated: Secondary | ICD-10-CM | POA: Diagnosis not present

## 2016-09-07 DIAGNOSIS — I509 Heart failure, unspecified: Secondary | ICD-10-CM | POA: Diagnosis not present

## 2016-09-07 DIAGNOSIS — J09X2 Influenza due to identified novel influenza A virus with other respiratory manifestations: Secondary | ICD-10-CM | POA: Diagnosis not present

## 2016-09-07 DIAGNOSIS — M109 Gout, unspecified: Secondary | ICD-10-CM | POA: Diagnosis not present

## 2016-09-07 DIAGNOSIS — I4891 Unspecified atrial fibrillation: Secondary | ICD-10-CM | POA: Diagnosis not present

## 2016-09-07 DIAGNOSIS — K219 Gastro-esophageal reflux disease without esophagitis: Secondary | ICD-10-CM | POA: Diagnosis not present

## 2016-09-08 DIAGNOSIS — K219 Gastro-esophageal reflux disease without esophagitis: Secondary | ICD-10-CM | POA: Diagnosis not present

## 2016-09-08 DIAGNOSIS — J45909 Unspecified asthma, uncomplicated: Secondary | ICD-10-CM | POA: Diagnosis not present

## 2016-09-08 DIAGNOSIS — I509 Heart failure, unspecified: Secondary | ICD-10-CM | POA: Diagnosis not present

## 2016-09-08 DIAGNOSIS — M109 Gout, unspecified: Secondary | ICD-10-CM | POA: Diagnosis not present

## 2016-09-08 DIAGNOSIS — I4891 Unspecified atrial fibrillation: Secondary | ICD-10-CM | POA: Diagnosis not present

## 2016-09-08 DIAGNOSIS — J09X2 Influenza due to identified novel influenza A virus with other respiratory manifestations: Secondary | ICD-10-CM | POA: Diagnosis not present

## 2016-09-09 DIAGNOSIS — G5601 Carpal tunnel syndrome, right upper limb: Secondary | ICD-10-CM | POA: Diagnosis not present

## 2016-09-09 DIAGNOSIS — Z299 Encounter for prophylactic measures, unspecified: Secondary | ICD-10-CM | POA: Diagnosis not present

## 2016-09-09 DIAGNOSIS — F329 Major depressive disorder, single episode, unspecified: Secondary | ICD-10-CM | POA: Diagnosis not present

## 2016-09-09 DIAGNOSIS — G4733 Obstructive sleep apnea (adult) (pediatric): Secondary | ICD-10-CM | POA: Diagnosis not present

## 2016-09-09 DIAGNOSIS — I5030 Unspecified diastolic (congestive) heart failure: Secondary | ICD-10-CM | POA: Diagnosis not present

## 2016-09-09 DIAGNOSIS — E538 Deficiency of other specified B group vitamins: Secondary | ICD-10-CM | POA: Diagnosis not present

## 2016-09-09 DIAGNOSIS — J449 Chronic obstructive pulmonary disease, unspecified: Secondary | ICD-10-CM | POA: Diagnosis not present

## 2016-09-09 DIAGNOSIS — Z6836 Body mass index (BMI) 36.0-36.9, adult: Secondary | ICD-10-CM | POA: Diagnosis not present

## 2016-09-09 DIAGNOSIS — I482 Chronic atrial fibrillation: Secondary | ICD-10-CM | POA: Diagnosis not present

## 2016-09-09 DIAGNOSIS — I739 Peripheral vascular disease, unspecified: Secondary | ICD-10-CM | POA: Diagnosis not present

## 2016-09-09 DIAGNOSIS — I10 Essential (primary) hypertension: Secondary | ICD-10-CM | POA: Diagnosis not present

## 2016-09-09 DIAGNOSIS — K219 Gastro-esophageal reflux disease without esophagitis: Secondary | ICD-10-CM | POA: Diagnosis not present

## 2016-09-10 DIAGNOSIS — K219 Gastro-esophageal reflux disease without esophagitis: Secondary | ICD-10-CM | POA: Diagnosis not present

## 2016-09-10 DIAGNOSIS — I509 Heart failure, unspecified: Secondary | ICD-10-CM | POA: Diagnosis not present

## 2016-09-10 DIAGNOSIS — J45909 Unspecified asthma, uncomplicated: Secondary | ICD-10-CM | POA: Diagnosis not present

## 2016-09-10 DIAGNOSIS — M109 Gout, unspecified: Secondary | ICD-10-CM | POA: Diagnosis not present

## 2016-09-10 DIAGNOSIS — I4891 Unspecified atrial fibrillation: Secondary | ICD-10-CM | POA: Diagnosis not present

## 2016-09-10 DIAGNOSIS — J09X2 Influenza due to identified novel influenza A virus with other respiratory manifestations: Secondary | ICD-10-CM | POA: Diagnosis not present

## 2016-09-14 DIAGNOSIS — J45909 Unspecified asthma, uncomplicated: Secondary | ICD-10-CM | POA: Diagnosis not present

## 2016-09-14 DIAGNOSIS — I509 Heart failure, unspecified: Secondary | ICD-10-CM | POA: Diagnosis not present

## 2016-09-14 DIAGNOSIS — J09X2 Influenza due to identified novel influenza A virus with other respiratory manifestations: Secondary | ICD-10-CM | POA: Diagnosis not present

## 2016-09-14 DIAGNOSIS — K219 Gastro-esophageal reflux disease without esophagitis: Secondary | ICD-10-CM | POA: Diagnosis not present

## 2016-09-14 DIAGNOSIS — M109 Gout, unspecified: Secondary | ICD-10-CM | POA: Diagnosis not present

## 2016-09-14 DIAGNOSIS — I4891 Unspecified atrial fibrillation: Secondary | ICD-10-CM | POA: Diagnosis not present

## 2016-09-16 DIAGNOSIS — J449 Chronic obstructive pulmonary disease, unspecified: Secondary | ICD-10-CM | POA: Diagnosis not present

## 2016-09-16 DIAGNOSIS — E538 Deficiency of other specified B group vitamins: Secondary | ICD-10-CM | POA: Diagnosis not present

## 2016-09-16 DIAGNOSIS — G4733 Obstructive sleep apnea (adult) (pediatric): Secondary | ICD-10-CM | POA: Diagnosis not present

## 2016-09-16 DIAGNOSIS — I10 Essential (primary) hypertension: Secondary | ICD-10-CM | POA: Diagnosis not present

## 2016-09-16 DIAGNOSIS — I739 Peripheral vascular disease, unspecified: Secondary | ICD-10-CM | POA: Diagnosis not present

## 2016-09-16 DIAGNOSIS — I5030 Unspecified diastolic (congestive) heart failure: Secondary | ICD-10-CM | POA: Diagnosis not present

## 2016-09-16 DIAGNOSIS — M79609 Pain in unspecified limb: Secondary | ICD-10-CM | POA: Diagnosis not present

## 2016-09-16 DIAGNOSIS — N400 Enlarged prostate without lower urinary tract symptoms: Secondary | ICD-10-CM | POA: Diagnosis not present

## 2016-09-16 DIAGNOSIS — Z6835 Body mass index (BMI) 35.0-35.9, adult: Secondary | ICD-10-CM | POA: Diagnosis not present

## 2016-09-16 DIAGNOSIS — Z299 Encounter for prophylactic measures, unspecified: Secondary | ICD-10-CM | POA: Diagnosis not present

## 2016-09-16 DIAGNOSIS — F329 Major depressive disorder, single episode, unspecified: Secondary | ICD-10-CM | POA: Diagnosis not present

## 2016-09-16 DIAGNOSIS — I482 Chronic atrial fibrillation: Secondary | ICD-10-CM | POA: Diagnosis not present

## 2016-09-17 DIAGNOSIS — J45909 Unspecified asthma, uncomplicated: Secondary | ICD-10-CM | POA: Diagnosis not present

## 2016-09-17 DIAGNOSIS — K219 Gastro-esophageal reflux disease without esophagitis: Secondary | ICD-10-CM | POA: Diagnosis not present

## 2016-09-17 DIAGNOSIS — M109 Gout, unspecified: Secondary | ICD-10-CM | POA: Diagnosis not present

## 2016-09-17 DIAGNOSIS — I4891 Unspecified atrial fibrillation: Secondary | ICD-10-CM | POA: Diagnosis not present

## 2016-09-17 DIAGNOSIS — J09X2 Influenza due to identified novel influenza A virus with other respiratory manifestations: Secondary | ICD-10-CM | POA: Diagnosis not present

## 2016-09-17 DIAGNOSIS — I509 Heart failure, unspecified: Secondary | ICD-10-CM | POA: Diagnosis not present

## 2016-09-22 DIAGNOSIS — H353131 Nonexudative age-related macular degeneration, bilateral, early dry stage: Secondary | ICD-10-CM | POA: Diagnosis not present

## 2016-09-22 DIAGNOSIS — J45909 Unspecified asthma, uncomplicated: Secondary | ICD-10-CM | POA: Diagnosis not present

## 2016-09-22 DIAGNOSIS — I4891 Unspecified atrial fibrillation: Secondary | ICD-10-CM | POA: Diagnosis not present

## 2016-09-22 DIAGNOSIS — I509 Heart failure, unspecified: Secondary | ICD-10-CM | POA: Diagnosis not present

## 2016-09-22 DIAGNOSIS — Z961 Presence of intraocular lens: Secondary | ICD-10-CM | POA: Diagnosis not present

## 2016-09-22 DIAGNOSIS — H26491 Other secondary cataract, right eye: Secondary | ICD-10-CM | POA: Diagnosis not present

## 2016-09-22 DIAGNOSIS — M109 Gout, unspecified: Secondary | ICD-10-CM | POA: Diagnosis not present

## 2016-09-22 DIAGNOSIS — K219 Gastro-esophageal reflux disease without esophagitis: Secondary | ICD-10-CM | POA: Diagnosis not present

## 2016-09-22 DIAGNOSIS — J09X2 Influenza due to identified novel influenza A virus with other respiratory manifestations: Secondary | ICD-10-CM | POA: Diagnosis not present

## 2016-09-29 DIAGNOSIS — I482 Chronic atrial fibrillation: Secondary | ICD-10-CM | POA: Diagnosis not present

## 2016-09-29 DIAGNOSIS — M549 Dorsalgia, unspecified: Secondary | ICD-10-CM | POA: Diagnosis not present

## 2016-09-29 DIAGNOSIS — Z6836 Body mass index (BMI) 36.0-36.9, adult: Secondary | ICD-10-CM | POA: Diagnosis not present

## 2016-09-29 DIAGNOSIS — R5383 Other fatigue: Secondary | ICD-10-CM | POA: Diagnosis not present

## 2016-09-29 DIAGNOSIS — Z7189 Other specified counseling: Secondary | ICD-10-CM | POA: Diagnosis not present

## 2016-09-29 DIAGNOSIS — Z299 Encounter for prophylactic measures, unspecified: Secondary | ICD-10-CM | POA: Diagnosis not present

## 2016-09-29 DIAGNOSIS — Z1389 Encounter for screening for other disorder: Secondary | ICD-10-CM | POA: Diagnosis not present

## 2016-09-29 DIAGNOSIS — F419 Anxiety disorder, unspecified: Secondary | ICD-10-CM | POA: Diagnosis not present

## 2016-09-29 DIAGNOSIS — I10 Essential (primary) hypertension: Secondary | ICD-10-CM | POA: Diagnosis not present

## 2016-09-29 DIAGNOSIS — F329 Major depressive disorder, single episode, unspecified: Secondary | ICD-10-CM | POA: Diagnosis not present

## 2016-09-29 DIAGNOSIS — Z0000 Encounter for general adult medical examination without abnormal findings: Secondary | ICD-10-CM | POA: Diagnosis not present

## 2016-09-29 DIAGNOSIS — J449 Chronic obstructive pulmonary disease, unspecified: Secondary | ICD-10-CM | POA: Diagnosis not present

## 2016-10-04 DIAGNOSIS — M79609 Pain in unspecified limb: Secondary | ICD-10-CM | POA: Diagnosis not present

## 2016-10-04 DIAGNOSIS — M79604 Pain in right leg: Secondary | ICD-10-CM | POA: Diagnosis not present

## 2016-10-04 DIAGNOSIS — M79605 Pain in left leg: Secondary | ICD-10-CM | POA: Diagnosis not present

## 2016-10-06 ENCOUNTER — Other Ambulatory Visit (INDEPENDENT_AMBULATORY_CARE_PROVIDER_SITE_OTHER): Payer: Self-pay | Admitting: Internal Medicine

## 2016-10-06 DIAGNOSIS — R5383 Other fatigue: Secondary | ICD-10-CM | POA: Diagnosis not present

## 2016-10-06 DIAGNOSIS — I10 Essential (primary) hypertension: Secondary | ICD-10-CM | POA: Diagnosis not present

## 2016-10-06 DIAGNOSIS — N400 Enlarged prostate without lower urinary tract symptoms: Secondary | ICD-10-CM | POA: Diagnosis not present

## 2016-10-06 DIAGNOSIS — R110 Nausea: Secondary | ICD-10-CM

## 2016-10-06 DIAGNOSIS — F419 Anxiety disorder, unspecified: Secondary | ICD-10-CM | POA: Diagnosis not present

## 2016-10-06 DIAGNOSIS — Z79899 Other long term (current) drug therapy: Secondary | ICD-10-CM | POA: Diagnosis not present

## 2016-10-13 DIAGNOSIS — I739 Peripheral vascular disease, unspecified: Secondary | ICD-10-CM | POA: Diagnosis not present

## 2016-10-21 DIAGNOSIS — H26491 Other secondary cataract, right eye: Secondary | ICD-10-CM | POA: Diagnosis not present

## 2016-10-29 DIAGNOSIS — I10 Essential (primary) hypertension: Secondary | ICD-10-CM | POA: Diagnosis not present

## 2016-10-29 DIAGNOSIS — I5030 Unspecified diastolic (congestive) heart failure: Secondary | ICD-10-CM | POA: Diagnosis not present

## 2016-10-29 DIAGNOSIS — G4733 Obstructive sleep apnea (adult) (pediatric): Secondary | ICD-10-CM | POA: Diagnosis not present

## 2016-10-29 DIAGNOSIS — I739 Peripheral vascular disease, unspecified: Secondary | ICD-10-CM | POA: Diagnosis not present

## 2016-10-29 DIAGNOSIS — J449 Chronic obstructive pulmonary disease, unspecified: Secondary | ICD-10-CM | POA: Diagnosis not present

## 2016-10-29 DIAGNOSIS — I482 Chronic atrial fibrillation: Secondary | ICD-10-CM | POA: Diagnosis not present

## 2016-10-29 DIAGNOSIS — Z299 Encounter for prophylactic measures, unspecified: Secondary | ICD-10-CM | POA: Diagnosis not present

## 2016-10-29 DIAGNOSIS — Z6836 Body mass index (BMI) 36.0-36.9, adult: Secondary | ICD-10-CM | POA: Diagnosis not present

## 2016-10-29 DIAGNOSIS — E538 Deficiency of other specified B group vitamins: Secondary | ICD-10-CM | POA: Diagnosis not present

## 2016-10-29 DIAGNOSIS — G5601 Carpal tunnel syndrome, right upper limb: Secondary | ICD-10-CM | POA: Diagnosis not present

## 2016-10-29 DIAGNOSIS — N400 Enlarged prostate without lower urinary tract symptoms: Secondary | ICD-10-CM | POA: Diagnosis not present

## 2016-10-29 DIAGNOSIS — F329 Major depressive disorder, single episode, unspecified: Secondary | ICD-10-CM | POA: Diagnosis not present

## 2016-11-15 DIAGNOSIS — J449 Chronic obstructive pulmonary disease, unspecified: Secondary | ICD-10-CM | POA: Diagnosis not present

## 2016-11-15 DIAGNOSIS — N400 Enlarged prostate without lower urinary tract symptoms: Secondary | ICD-10-CM | POA: Diagnosis not present

## 2016-11-15 DIAGNOSIS — I5030 Unspecified diastolic (congestive) heart failure: Secondary | ICD-10-CM | POA: Diagnosis not present

## 2016-11-15 DIAGNOSIS — Z6836 Body mass index (BMI) 36.0-36.9, adult: Secondary | ICD-10-CM | POA: Diagnosis not present

## 2016-11-15 DIAGNOSIS — G4733 Obstructive sleep apnea (adult) (pediatric): Secondary | ICD-10-CM | POA: Diagnosis not present

## 2016-11-15 DIAGNOSIS — G5601 Carpal tunnel syndrome, right upper limb: Secondary | ICD-10-CM | POA: Diagnosis not present

## 2016-11-15 DIAGNOSIS — Z299 Encounter for prophylactic measures, unspecified: Secondary | ICD-10-CM | POA: Diagnosis not present

## 2016-11-15 DIAGNOSIS — F329 Major depressive disorder, single episode, unspecified: Secondary | ICD-10-CM | POA: Diagnosis not present

## 2016-11-15 DIAGNOSIS — I482 Chronic atrial fibrillation: Secondary | ICD-10-CM | POA: Diagnosis not present

## 2016-11-15 DIAGNOSIS — K219 Gastro-esophageal reflux disease without esophagitis: Secondary | ICD-10-CM | POA: Diagnosis not present

## 2016-11-15 DIAGNOSIS — I10 Essential (primary) hypertension: Secondary | ICD-10-CM | POA: Diagnosis not present

## 2016-11-15 DIAGNOSIS — I739 Peripheral vascular disease, unspecified: Secondary | ICD-10-CM | POA: Diagnosis not present

## 2016-12-16 DIAGNOSIS — Z6836 Body mass index (BMI) 36.0-36.9, adult: Secondary | ICD-10-CM | POA: Diagnosis not present

## 2016-12-16 DIAGNOSIS — I482 Chronic atrial fibrillation: Secondary | ICD-10-CM | POA: Diagnosis not present

## 2016-12-16 DIAGNOSIS — J449 Chronic obstructive pulmonary disease, unspecified: Secondary | ICD-10-CM | POA: Diagnosis not present

## 2016-12-16 DIAGNOSIS — F419 Anxiety disorder, unspecified: Secondary | ICD-10-CM | POA: Diagnosis not present

## 2016-12-16 DIAGNOSIS — G4733 Obstructive sleep apnea (adult) (pediatric): Secondary | ICD-10-CM | POA: Diagnosis not present

## 2016-12-16 DIAGNOSIS — G5601 Carpal tunnel syndrome, right upper limb: Secondary | ICD-10-CM | POA: Diagnosis not present

## 2016-12-16 DIAGNOSIS — N400 Enlarged prostate without lower urinary tract symptoms: Secondary | ICD-10-CM | POA: Diagnosis not present

## 2016-12-16 DIAGNOSIS — I10 Essential (primary) hypertension: Secondary | ICD-10-CM | POA: Diagnosis not present

## 2016-12-16 DIAGNOSIS — I5030 Unspecified diastolic (congestive) heart failure: Secondary | ICD-10-CM | POA: Diagnosis not present

## 2016-12-16 DIAGNOSIS — I739 Peripheral vascular disease, unspecified: Secondary | ICD-10-CM | POA: Diagnosis not present

## 2016-12-16 DIAGNOSIS — Z299 Encounter for prophylactic measures, unspecified: Secondary | ICD-10-CM | POA: Diagnosis not present

## 2016-12-16 DIAGNOSIS — F329 Major depressive disorder, single episode, unspecified: Secondary | ICD-10-CM | POA: Diagnosis not present

## 2016-12-22 NOTE — Progress Notes (Signed)
Cardiology Office Note  Date: 12/23/2016   ID: Nicholas [name], DOB 10-Mar-1929, MRN [id number]  PCP: Nicholas Blitz, MD  Primary Cardiologist: Nicholas Lesches, MD   Chief Complaint  [name] presents with  . Atrial Fibrillation    History of Present Illness: Nicholas [name] is an 83 y.o. male last seen in December 2017. He presents for a follow-up visit, reports no chest pain or palpitations. Remains chronically fatigued but still tries to stay regular with ADLs and other chores. He reports compliance with his medications although was not able to take Lopressor on a 3 time a day schedule regularly. His heart rate is adequately controlled at this time.  He continues to follow with Dr. Manuella [name] on Coumadin. He reports no bleeding episodes.  I personally reviewed his ECG today which shows rate-controlled atrial fibrillation with low voltage.  Past Medical History:  Diagnosis Date  . Atrial fibrillation ([place])   . Cervical spondylosis without myelopathy   . COPD (chronic obstructive pulmonary disease) ([place])   . Diverticulosis of colon (without mention of hemorrhage)   . Esophageal reflux   . Essential hypertension, benign   . Malignant neoplasm of prostate ([place])   . Obstructive sleep apnea   . Osteoarthritis     Past Surgical History:  Procedure Laterality Date  . CHOLECYSTECTOMY N/A 07/03/2014   Procedure: LAPAROSCOPIC CHOLECYSTECTOMY;  Surgeon: Nicholas So, MD;  Location: [facility];  Service: General;  Laterality: N/A;  . COLONOSCOPY    . COLONOSCOPY N/A 12/18/2015   Procedure: COLONOSCOPY;  Surgeon: Nicholas Houston, MD;  Location: [facility];  Service: Endoscopy;  Laterality: N/A;  1200  . ESOPHAGEAL DILATION N/A 08/04/2016   Procedure: ESOPHAGEAL DILATION;  Surgeon: Nicholas Houston, MD;  [facility]: [facility];  Service: Endoscopy;  Laterality: N/A;  . ESOPHAGOGASTRODUODENOSCOPY N/A 08/04/2016   Procedure: ESOPHAGOGASTRODUODENOSCOPY (EGD);  Surgeon: Nicholas Houston, MD;  Location: [facility];  Service: Endoscopy;  Laterality: N/A;  12:00  . ESOPHAGOGASTRODUODENOSCOPY (EGD) WITH ESOPHAGEAL DILATION N/A 06/18/2013   Procedure: ESOPHAGOGASTRODUODENOSCOPY (EGD) WITH ESOPHAGEAL DILATION;  Surgeon: Nicholas Houston, MD;  [facility];  Service: Endoscopy;  Laterality: N/A;  220-rescheduled to 730 Ann to notify pt  . ROTATOR CUFF REPAIR    . TOTAL KNEE ARTHROPLASTY     2000, 2005.     Current Outpatient Prescriptions  Medication Sig Dispense Refill  . albuterol-ipratropium (COMBIVENT) 18-103 MCG/ACT inhaler Inhale 2 puffs into Nicholas lungs every 6 (six) hours as needed for wheezing or shortness of breath (uses rarely). UAD PRN    . allopurinol (ZYLOPRIM) 300 MG tablet Take 300 mg by mouth daily.      . Cholecalciferol (VITAMIN D3) 1000 UNITS tablet Take 1,000 Units by mouth daily.      . cyanocobalamin (,VITAMIN B-12,) 1000 MCG/ML injection Inject 1,000 mcg into Nicholas muscle every 30 (thirty) days.    . fluticasone (FLONASE) 50 MCG/ACT nasal spray Place 1 spray into both nostrils daily as needed for allergies or rhinitis.    [name] HYDROcodone-acetaminophen (NORCO/VICODIN) 5-325 MG tablet Take 1 tablet by mouth every 6 (six) hours as needed for moderate pain.    . hydroxypropyl methylcellulose / hypromellose (ISOPTO TEARS / GONIOVISC) 2.5 % ophthalmic solution Place 1 drop into both eyes 3 (three) times daily as needed for dry eyes.     . metoprolol tartrate (LOPRESSOR) 25 MG tablet Take 1 tablet (25 mg total) by mouth 3 (three) times daily. ([name] taking  differently: Take 25 mg by mouth 2 (two) times daily. ) 270 tablet 3  . Multiple Vitamins-Minerals (PRESERVISION AREDS) CAPS Take 1 capsule by mouth 2 (two) times daily.     . ondansetron (ZOFRAN) 4 MG tablet TAKE 1 TABLET EVERY 8 HOURS AS NEEDED FOR NAUSEA OR VOMITING 60 tablet 1  . pantoprazole (PROTONIX) 40 MG tablet Take 40 mg by mouth daily before breakfast.     . simethicone (MYLICON) 536 MG chewable tablet Chew 125 mg  by mouth every 6 (six) hours as needed for flatulence.    . sodium chloride (OCEAN) 0.65 % SOLN nasal spray Place 1 spray into both nostrils 3 (three) times daily as needed for congestion.    [name] VIAGRA 100 MG tablet Take 100 mg by mouth daily as needed for erectile dysfunction.     [name] warfarin (COUMADIN) 5 MG tablet Take 2.5-5 mg by mouth See admin instructions. Take 5 mg on Mondays . Take 2.5 mg all other days.  UAD - managed by Dr. Manuella [name]     No current facility-administered medications for this visit.    Allergies:  Sulfonamide derivatives   Social History: Nicholas [name]  reports that he quit smoking about 51 years ago. His smoking use included Cigarettes. He started smoking about 69 years ago. He has a 18.00 pack-year smoking history. He quit smokeless tobacco use about 49 years ago. His smokeless tobacco use included Chew. He reports that he does not drink alcohol or use drugs.   ROS:  Please see Nicholas history of present illness. Otherwise, complete review of systems is positive for arthritic stiffness.  All other systems are reviewed and negative.   Physical Exam: VS:  BP 118/72   Pulse (!) 56   Ht _0  (1.753 m)   Wt 232 lb (105.2 kg)   SpO2 99%   BMI 34.26 kg/m , BMI Body mass index is 34.26 kg/m.  Wt Readings from Last 3 Encounters:  12/23/16 232 lb (105.2 kg)  08/04/16 227 lb (103 kg)  06/23/16 227 lb (103 kg)    Obese male in no acute distress.  HEENT: Conjunctiva and lids normal, oropharynx with moist mucosa.  Neck: Supple, no elevated JVP.  Lungs: Clear to auscultation, diminished, no wheezing.  Cardiac: Irregularly irregular, no S3 or rub.  Abdomen: Nontender, bowel sounds present,.  Extremities: No pitting edema. Distal pulses are 2+.  ECG: I personally reviewed Nicholas tracing from 12/04/2015 which showed rate-controlled atrial fibrillation.  Recent Labwork:  April 2018: Hgb 13., platelets 212, BUN 12, creatinine 0.79, potassium 4.5, AST 23, ALT 6, TSH 2.35,  cholesterol 166, triglycerides 80, HDL 58, LDL 92  Other Studies Reviewed Today:  Echocardiogram 08/06/2015: Study Conclusions  - Left ventricle: Nicholas cavity size was normal. Wall thickness was  increased in a pattern of mild LVH. Systolic function was normal.  Nicholas estimated ejection fraction was in Nicholas range of 60% to 65%.  Wall motion was normal; there were no regional wall motion  abnormalities. Nicholas study is not technically sufficient to allow  evaluation of LV diastolic function. - Aortic valve: There was trivial regurgitation. - Aortic root: Nicholas aortic root was mildly ectatic. - Mitral valve: Calcified annulus. There was trivial regurgitation. - Left atrium: Nicholas atrium was moderately to severely dilated. - Right ventricle: Nicholas cavity size was mildly to moderately  dilated. Systolic function was normal. - Right atrium: Nicholas atrium was moderately to severely dilated.  Central venous pressure (est): 3 mm Hg. - Atrial  septum: No defect or patent foramen ovale was identified. - Tricuspid valve: There was trivial regurgitation. - Pulmonary arteries: PA peak pressure: 17 mm Hg (S). - Pericardium, extracardiac: There was no pericardial effusion.  Impressions:  - Mild LVH with LVEF 60-65%. Indeterminate diastolic function.  Moderate to severe biatrial enlargement. MAC with trivial mitral  regurgitation. Mildly ectatic aortic root. Sclerotic aortic valve  with trivial aortic regurgitation. Mildly to moderately dilated  RV with normal contraction. Trivial tricuspid regurgitation with  PASP estimated 17 mmHg.  Assessment and Plan:  1. Chronic atrial fibrillation, heart rate is adequately controlled today. We will continue Lopressor at twice daily dosing and he will otherwise remain on Coumadin with follow-up per Dr. Manuella [name].  2. Essential hypertension, blood pressure is adequately controlled.  Current medicines were reviewed with Nicholas [name] today.   Orders Placed This  Encounter  Procedures  . EKG 12-Lead    Disposition: Follow-up in 6 months.  Signed, Satira Sark, MD, [facility] 12/23/2016 11:18 AM    [facility] [place], [place], [place] [address] Phone: [phone]; Fax: [phone]

## 2016-12-23 ENCOUNTER — Encounter: Payer: Self-pay | Admitting: Cardiology

## 2016-12-23 ENCOUNTER — Ambulatory Visit (INDEPENDENT_AMBULATORY_CARE_PROVIDER_SITE_OTHER): Payer: Medicare Other | Admitting: Cardiology

## 2016-12-23 VITALS — BP 118/72 | HR 56 | Ht 69.0 in | Wt 232.0 lb

## 2016-12-23 DIAGNOSIS — I482 Chronic atrial fibrillation, unspecified: Secondary | ICD-10-CM

## 2016-12-23 DIAGNOSIS — I10 Essential (primary) hypertension: Secondary | ICD-10-CM | POA: Diagnosis not present

## 2016-12-23 NOTE — Patient Instructions (Signed)

## 2017-01-13 DIAGNOSIS — I739 Peripheral vascular disease, unspecified: Secondary | ICD-10-CM | POA: Diagnosis not present

## 2017-01-14 DIAGNOSIS — I739 Peripheral vascular disease, unspecified: Secondary | ICD-10-CM | POA: Diagnosis not present

## 2017-01-14 DIAGNOSIS — I482 Chronic atrial fibrillation: Secondary | ICD-10-CM | POA: Diagnosis not present

## 2017-01-14 DIAGNOSIS — Z6836 Body mass index (BMI) 36.0-36.9, adult: Secondary | ICD-10-CM | POA: Diagnosis not present

## 2017-01-14 DIAGNOSIS — E538 Deficiency of other specified B group vitamins: Secondary | ICD-10-CM | POA: Diagnosis not present

## 2017-01-14 DIAGNOSIS — M1990 Unspecified osteoarthritis, unspecified site: Secondary | ICD-10-CM | POA: Diagnosis not present

## 2017-01-14 DIAGNOSIS — I10 Essential (primary) hypertension: Secondary | ICD-10-CM | POA: Diagnosis not present

## 2017-01-14 DIAGNOSIS — I5030 Unspecified diastolic (congestive) heart failure: Secondary | ICD-10-CM | POA: Diagnosis not present

## 2017-01-14 DIAGNOSIS — Z299 Encounter for prophylactic measures, unspecified: Secondary | ICD-10-CM | POA: Diagnosis not present

## 2017-01-14 DIAGNOSIS — G4733 Obstructive sleep apnea (adult) (pediatric): Secondary | ICD-10-CM | POA: Diagnosis not present

## 2017-01-14 DIAGNOSIS — J449 Chronic obstructive pulmonary disease, unspecified: Secondary | ICD-10-CM | POA: Diagnosis not present

## 2017-01-14 DIAGNOSIS — F329 Major depressive disorder, single episode, unspecified: Secondary | ICD-10-CM | POA: Diagnosis not present

## 2017-02-11 DIAGNOSIS — Z299 Encounter for prophylactic measures, unspecified: Secondary | ICD-10-CM | POA: Diagnosis not present

## 2017-02-11 DIAGNOSIS — I5030 Unspecified diastolic (congestive) heart failure: Secondary | ICD-10-CM | POA: Diagnosis not present

## 2017-02-11 DIAGNOSIS — J449 Chronic obstructive pulmonary disease, unspecified: Secondary | ICD-10-CM | POA: Diagnosis not present

## 2017-02-11 DIAGNOSIS — N400 Enlarged prostate without lower urinary tract symptoms: Secondary | ICD-10-CM | POA: Diagnosis not present

## 2017-02-11 DIAGNOSIS — E538 Deficiency of other specified B group vitamins: Secondary | ICD-10-CM | POA: Diagnosis not present

## 2017-02-11 DIAGNOSIS — F329 Major depressive disorder, single episode, unspecified: Secondary | ICD-10-CM | POA: Diagnosis not present

## 2017-02-11 DIAGNOSIS — G4733 Obstructive sleep apnea (adult) (pediatric): Secondary | ICD-10-CM | POA: Diagnosis not present

## 2017-02-11 DIAGNOSIS — I739 Peripheral vascular disease, unspecified: Secondary | ICD-10-CM | POA: Diagnosis not present

## 2017-02-11 DIAGNOSIS — I482 Chronic atrial fibrillation: Secondary | ICD-10-CM | POA: Diagnosis not present

## 2017-02-11 DIAGNOSIS — Z6836 Body mass index (BMI) 36.0-36.9, adult: Secondary | ICD-10-CM | POA: Diagnosis not present

## 2017-02-11 DIAGNOSIS — I10 Essential (primary) hypertension: Secondary | ICD-10-CM | POA: Diagnosis not present

## 2017-03-11 DIAGNOSIS — G4700 Insomnia, unspecified: Secondary | ICD-10-CM | POA: Diagnosis not present

## 2017-03-11 DIAGNOSIS — J449 Chronic obstructive pulmonary disease, unspecified: Secondary | ICD-10-CM | POA: Diagnosis not present

## 2017-03-11 DIAGNOSIS — I482 Chronic atrial fibrillation: Secondary | ICD-10-CM | POA: Diagnosis not present

## 2017-03-11 DIAGNOSIS — I10 Essential (primary) hypertension: Secondary | ICD-10-CM | POA: Diagnosis not present

## 2017-03-11 DIAGNOSIS — F329 Major depressive disorder, single episode, unspecified: Secondary | ICD-10-CM | POA: Diagnosis not present

## 2017-03-11 DIAGNOSIS — E538 Deficiency of other specified B group vitamins: Secondary | ICD-10-CM | POA: Diagnosis not present

## 2017-03-11 DIAGNOSIS — I739 Peripheral vascular disease, unspecified: Secondary | ICD-10-CM | POA: Diagnosis not present

## 2017-03-11 DIAGNOSIS — Z6836 Body mass index (BMI) 36.0-36.9, adult: Secondary | ICD-10-CM | POA: Diagnosis not present

## 2017-03-11 DIAGNOSIS — Z299 Encounter for prophylactic measures, unspecified: Secondary | ICD-10-CM | POA: Diagnosis not present

## 2017-03-11 DIAGNOSIS — N400 Enlarged prostate without lower urinary tract symptoms: Secondary | ICD-10-CM | POA: Diagnosis not present

## 2017-03-11 DIAGNOSIS — G4733 Obstructive sleep apnea (adult) (pediatric): Secondary | ICD-10-CM | POA: Diagnosis not present

## 2017-03-11 DIAGNOSIS — I5030 Unspecified diastolic (congestive) heart failure: Secondary | ICD-10-CM | POA: Diagnosis not present

## 2017-03-17 DIAGNOSIS — Z23 Encounter for immunization: Secondary | ICD-10-CM | POA: Diagnosis not present

## 2017-04-07 DIAGNOSIS — I739 Peripheral vascular disease, unspecified: Secondary | ICD-10-CM | POA: Diagnosis not present

## 2017-04-12 DIAGNOSIS — I482 Chronic atrial fibrillation: Secondary | ICD-10-CM | POA: Diagnosis not present

## 2017-04-12 DIAGNOSIS — R5383 Other fatigue: Secondary | ICD-10-CM | POA: Diagnosis not present

## 2017-04-12 DIAGNOSIS — Z6836 Body mass index (BMI) 36.0-36.9, adult: Secondary | ICD-10-CM | POA: Diagnosis not present

## 2017-04-12 DIAGNOSIS — M1990 Unspecified osteoarthritis, unspecified site: Secondary | ICD-10-CM | POA: Diagnosis not present

## 2017-04-12 DIAGNOSIS — I5030 Unspecified diastolic (congestive) heart failure: Secondary | ICD-10-CM | POA: Diagnosis not present

## 2017-04-12 DIAGNOSIS — Z299 Encounter for prophylactic measures, unspecified: Secondary | ICD-10-CM | POA: Diagnosis not present

## 2017-04-12 DIAGNOSIS — E538 Deficiency of other specified B group vitamins: Secondary | ICD-10-CM | POA: Diagnosis not present

## 2017-04-12 DIAGNOSIS — I10 Essential (primary) hypertension: Secondary | ICD-10-CM | POA: Diagnosis not present

## 2017-04-12 LAB — PROTIME-INR

## 2017-05-10 DIAGNOSIS — M25512 Pain in left shoulder: Secondary | ICD-10-CM | POA: Diagnosis not present

## 2017-05-10 DIAGNOSIS — M19112 Post-traumatic osteoarthritis, left shoulder: Secondary | ICD-10-CM | POA: Insufficient documentation

## 2017-05-10 DIAGNOSIS — M7522 Bicipital tendinitis, left shoulder: Secondary | ICD-10-CM | POA: Diagnosis not present

## 2017-05-13 DIAGNOSIS — I482 Chronic atrial fibrillation: Secondary | ICD-10-CM | POA: Diagnosis not present

## 2017-05-13 DIAGNOSIS — Z6836 Body mass index (BMI) 36.0-36.9, adult: Secondary | ICD-10-CM | POA: Diagnosis not present

## 2017-05-13 DIAGNOSIS — Z299 Encounter for prophylactic measures, unspecified: Secondary | ICD-10-CM | POA: Diagnosis not present

## 2017-05-13 DIAGNOSIS — I10 Essential (primary) hypertension: Secondary | ICD-10-CM | POA: Diagnosis not present

## 2017-05-13 DIAGNOSIS — E538 Deficiency of other specified B group vitamins: Secondary | ICD-10-CM | POA: Diagnosis not present

## 2017-05-13 DIAGNOSIS — I5030 Unspecified diastolic (congestive) heart failure: Secondary | ICD-10-CM | POA: Diagnosis not present

## 2017-05-16 DIAGNOSIS — R509 Fever, unspecified: Secondary | ICD-10-CM | POA: Diagnosis not present

## 2017-05-16 DIAGNOSIS — R6889 Other general symptoms and signs: Secondary | ICD-10-CM | POA: Diagnosis not present

## 2017-05-16 DIAGNOSIS — I482 Chronic atrial fibrillation: Secondary | ICD-10-CM | POA: Diagnosis not present

## 2017-05-16 DIAGNOSIS — Z6836 Body mass index (BMI) 36.0-36.9, adult: Secondary | ICD-10-CM | POA: Diagnosis not present

## 2017-05-16 DIAGNOSIS — I10 Essential (primary) hypertension: Secondary | ICD-10-CM | POA: Diagnosis not present

## 2017-05-16 DIAGNOSIS — M1990 Unspecified osteoarthritis, unspecified site: Secondary | ICD-10-CM | POA: Diagnosis not present

## 2017-05-16 DIAGNOSIS — Z713 Dietary counseling and surveillance: Secondary | ICD-10-CM | POA: Diagnosis not present

## 2017-05-16 DIAGNOSIS — Z87891 Personal history of nicotine dependence: Secondary | ICD-10-CM | POA: Diagnosis not present

## 2017-05-16 DIAGNOSIS — Z299 Encounter for prophylactic measures, unspecified: Secondary | ICD-10-CM | POA: Diagnosis not present

## 2017-05-26 ENCOUNTER — Encounter (INDEPENDENT_AMBULATORY_CARE_PROVIDER_SITE_OTHER): Payer: Self-pay

## 2017-05-26 ENCOUNTER — Encounter (INDEPENDENT_AMBULATORY_CARE_PROVIDER_SITE_OTHER): Payer: Self-pay | Admitting: Internal Medicine

## 2017-05-26 ENCOUNTER — Ambulatory Visit (INDEPENDENT_AMBULATORY_CARE_PROVIDER_SITE_OTHER): Payer: Medicare Other | Admitting: Internal Medicine

## 2017-05-26 VITALS — BP 118/80 | HR 70 | Temp 98.2°F | Ht 69.0 in | Wt 223.2 lb

## 2017-05-26 DIAGNOSIS — R195 Other fecal abnormalities: Secondary | ICD-10-CM | POA: Diagnosis not present

## 2017-05-26 NOTE — Progress Notes (Signed)
Subjective:    Patient ID: Nicholas [name], male    DOB: [birth year], 83 y.o.   MRN: [id number]  HPI Presents today with c/o. Diarrhea and gas. He says he has some nausea. He says he has urgency off an on.  He is having some diarrhea. It starts out as a normal stool in the morning and progressively loosens during the day. Usually has about 3 stools a day.  He was advised by [name] to take Imodium as needed. States this has been going on for 6 months. He underwent a cholecystectomy 2  years ago. He has urgency and abdominal griping.  Having a BM daily and sometimes more.  If he takes a Imodium, his stool become hard. His appetite has remained good. No weight loss.  Has not had melena or BRRB   Last seen in by me in November of 2017 for GERD. Underwent an EGD/ED in February of this year which revealed  Impression:               - Normal esophagus.                           - Z-line regular, 45 cm from the incisors.                           - No endoscopic esophageal abnormality to explain                            patient's dysphagia. Esophagus dilated. Dilated.                           - few small polyps at gastric fundus and body.                            These were left alone.                           - 12 mm olive snared from gastric antrum. Single                            360 clip applied to polypectomy site.                           - No specimens collected.  Hx of atrial fib and maintained of Warfarin.     12/18/2015 Colonoscopy:   Indications: Heme positive stool Impression: - Diverticulosis in the entire examined colon. - One 4 mm polyp in the ascending colon. Biopsied. - One 6 x 14 mm polyp in the transverse colon,  removed piecemeal using a cold snare. this polyp  felt to be residual asspot diet noted next to it.    Resected and retrieved. Clips (MR conditional) were  placed. 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 - External hemorrhoids.  Polyps are tubular adenomas. Results given to patient. Office visit in 5 years to determine whether or not he should undergo future   Review of Systems Past Medical History:  Diagnosis Date  . Atrial fibrillation ([place])   . Cervical spondylosis without myelopathy   . COPD (chronic obstructive pulmonary disease) ([place])   . Diverticulosis of  colon (without mention of hemorrhage)   . Esophageal reflux   . Essential hypertension, benign   . Malignant neoplasm of prostate ([place])   . Obstructive sleep apnea   . Osteoarthritis     Past Surgical History:  Procedure Laterality Date  . CHOLECYSTECTOMY N/A 07/03/2014   Procedure: LAPAROSCOPIC CHOLECYSTECTOMY;  Surgeon: [name], MD;  Location: [facility];  Service: General;  Laterality: N/A;  . COLONOSCOPY    . COLONOSCOPY N/A 12/18/2015   Procedure: COLONOSCOPY;  Surgeon: [name], MD;  [facility];  Service: Endoscopy;  Laterality: N/A;  1200  . ESOPHAGEAL DILATION N/A 08/04/2016   Procedure: ESOPHAGEAL DILATION;  Surgeon: [name], MD;  [facility];  Service: Endoscopy;  Laterality: N/A;  . ESOPHAGOGASTRODUODENOSCOPY N/A 08/04/2016   Procedure: ESOPHAGOGASTRODUODENOSCOPY (EGD);  Surgeon: [name], MD;  [facility];  Service: Endoscopy;  Laterality: N/A;  12:00  . ESOPHAGOGASTRODUODENOSCOPY (EGD) WITH ESOPHAGEAL DILATION N/A 06/18/2013   Procedure: ESOPHAGOGASTRODUODENOSCOPY (EGD) WITH ESOPHAGEAL DILATION;  Surgeon: [name], MD;  [facility];  Service: Endoscopy;  Laterality: N/A;  220-rescheduled to 730 Ann to notify pt  . ROTATOR CUFF REPAIR    . TOTAL KNEE ARTHROPLASTY     2000, 2005.      Allergies  Allergen Reactions  . Sulfonamide Derivatives Rash    Current Outpatient Medications on File Prior to Visit  Medication Sig Dispense Refill  . albuterol-ipratropium (COMBIVENT) 18-103 MCG/ACT inhaler Inhale 2 puffs into the lungs every 6 (six) hours as needed for wheezing or shortness of breath (uses rarely). UAD PRN    . allopurinol (ZYLOPRIM) 300 MG tablet Take 300 mg by mouth daily.      . Cholecalciferol (VITAMIN D3) 1000 UNITS tablet Take 1,000 Units by mouth daily.      . cyanocobalamin (,VITAMIN B-12,) 1000 MCG/ML injection Inject 1,000 mcg into the muscle every 30 (thirty) days.    . hydroxypropyl methylcellulose / hypromellose (ISOPTO TEARS / GONIOVISC) 2.5 % ophthalmic solution Place 1 drop into both eyes 3 (three) times daily as needed for dry eyes.     . metoprolol tartrate (LOPRESSOR) 25 MG tablet Take 1 tablet (25 mg total) by mouth 3 (three) times daily. (Patient taking differently: Take 25 mg by mouth 2 (two) times daily. ) 270 tablet 3  . Multiple Vitamins-Minerals (PRESERVISION AREDS) CAPS Take 1 capsule by mouth 2 (two) times daily.     . Multiple Vitamins-Minerals (PRESERVISION/LUTEIN PO) Take by mouth.    . ondansetron (ZOFRAN) 4 MG tablet TAKE 1 TABLET EVERY 8 HOURS AS NEEDED FOR NAUSEA OR VOMITING 60 tablet 1  . pantoprazole (PROTONIX) 40 MG tablet Take 40 mg by mouth daily before breakfast.     . simethicone (MYLICON) 409 MG chewable tablet Chew 125 mg by mouth every 6 (six) hours as needed for flatulence.    [name] VIAGRA 100 MG tablet Take 100 mg by mouth daily as needed for erectile dysfunction.     [name] warfarin (COUMADIN) 5 MG tablet Take 2.5-5 mg by mouth See admin instructions. Take 5 mg on Mondays . Take 2.5 mg all other days.  UAD - managed by [name]     No current facility-administered medications on file prior to visit.         Objective:   Physical Exam Blood pressure 118/80, pulse 70, temperature 98.2 F (36.8 C), height 5\' 9"  (1.753 m),  weight 223 lb 3.2 oz (101.2  kg). Alert and oriented. Skin warm and dry. Oral mucosa is moist.   . Sclera anicteric, conjunctivae is pink. Thyroid not enlarged. No cervical lymphadenopathy. Lungs clear. Heart regular rate and rhythm.  Abdomen is soft. Bowel sounds are positive. No hepatomegaly. No abdominal masses felt. No tenderness.  No edema to lower extremities.           Assessment & Plan:  Loose stools. It sounds like he a component of IBS diarrhea. Advised him to take Imodium as needed. Samples of IBGARD given to patient.

## 2017-05-26 NOTE — Patient Instructions (Signed)
Samples of IBGARD given to patient.

## 2017-06-03 DIAGNOSIS — C61 Malignant neoplasm of prostate: Secondary | ICD-10-CM | POA: Diagnosis not present

## 2017-06-10 ENCOUNTER — Telehealth (INDEPENDENT_AMBULATORY_CARE_PROVIDER_SITE_OTHER): Payer: Self-pay | Admitting: Internal Medicine

## 2017-06-10 NOTE — Telephone Encounter (Signed)
Patient called to give a progress report on the Ibgard that Deberah Castle, NP gave him.  He stated it's working fairly well right now.  He would like more samples before he pays or a script.  [phone]

## 2017-06-13 NOTE — Telephone Encounter (Signed)
Patient was informed, he will pick them up tomorrow morning.

## 2017-06-13 NOTE — Telephone Encounter (Signed)
Please let patient know, samples are at front

## 2017-06-14 ENCOUNTER — Ambulatory Visit (INDEPENDENT_AMBULATORY_CARE_PROVIDER_SITE_OTHER): Payer: Medicare Other | Admitting: Urology

## 2017-06-14 DIAGNOSIS — N5201 Erectile dysfunction due to arterial insufficiency: Secondary | ICD-10-CM

## 2017-06-14 DIAGNOSIS — Z299 Encounter for prophylactic measures, unspecified: Secondary | ICD-10-CM | POA: Diagnosis not present

## 2017-06-14 DIAGNOSIS — Z8546 Personal history of malignant neoplasm of prostate: Secondary | ICD-10-CM

## 2017-06-14 DIAGNOSIS — Z6835 Body mass index (BMI) 35.0-35.9, adult: Secondary | ICD-10-CM | POA: Diagnosis not present

## 2017-06-14 DIAGNOSIS — I482 Chronic atrial fibrillation: Secondary | ICD-10-CM | POA: Diagnosis not present

## 2017-06-14 DIAGNOSIS — E538 Deficiency of other specified B group vitamins: Secondary | ICD-10-CM | POA: Diagnosis not present

## 2017-06-14 DIAGNOSIS — I10 Essential (primary) hypertension: Secondary | ICD-10-CM | POA: Diagnosis not present

## 2017-06-14 DIAGNOSIS — N401 Enlarged prostate with lower urinary tract symptoms: Secondary | ICD-10-CM | POA: Diagnosis not present

## 2017-06-14 DIAGNOSIS — M1990 Unspecified osteoarthritis, unspecified site: Secondary | ICD-10-CM | POA: Diagnosis not present

## 2017-06-16 DIAGNOSIS — I739 Peripheral vascular disease, unspecified: Secondary | ICD-10-CM | POA: Diagnosis not present

## 2017-06-24 DIAGNOSIS — I509 Heart failure, unspecified: Secondary | ICD-10-CM | POA: Diagnosis not present

## 2017-06-24 DIAGNOSIS — J449 Chronic obstructive pulmonary disease, unspecified: Secondary | ICD-10-CM | POA: Diagnosis not present

## 2017-06-24 DIAGNOSIS — I4891 Unspecified atrial fibrillation: Secondary | ICD-10-CM | POA: Diagnosis not present

## 2017-06-24 DIAGNOSIS — I10 Essential (primary) hypertension: Secondary | ICD-10-CM | POA: Diagnosis not present

## 2017-06-27 ENCOUNTER — Encounter: Payer: Self-pay | Admitting: Cardiology

## 2017-06-27 ENCOUNTER — Ambulatory Visit (INDEPENDENT_AMBULATORY_CARE_PROVIDER_SITE_OTHER): Payer: Medicare Other | Admitting: Cardiology

## 2017-06-27 ENCOUNTER — Other Ambulatory Visit: Payer: Self-pay

## 2017-06-27 VITALS — BP 126/78 | HR 80 | Ht 69.0 in | Wt 236.0 lb

## 2017-06-27 DIAGNOSIS — I482 Chronic atrial fibrillation, unspecified: Secondary | ICD-10-CM

## 2017-06-27 DIAGNOSIS — I10 Essential (primary) hypertension: Secondary | ICD-10-CM | POA: Diagnosis not present

## 2017-06-27 NOTE — Progress Notes (Signed)
Cardiology Office Note  Date: 06/27/2017   ID: Nicholas [name], DOB 1929-02-09, MRN [id number]  PCP: [name], MD  Primary Cardiologist: [name], MD   Chief Complaint  Patient presents with  . Atrial Fibrillation    History of Present Illness: Nicholas [name] is an 84 y.o. male last seen in June. He presents for a routine follow-up visit. Continues to complain of chronic fatigue, not necessarily palpitations or increasing dyspnea on exertion however. He does not report exertional chest pain. No syncope.  He continues on Coumadin with follow-up per [name]. He does not reporting any bleeding problems.  I reviewed interval lab work from October per [name] as outlined below.  Past Medical History:  Diagnosis Date  . Atrial fibrillation ([place])   . Cervical spondylosis without myelopathy   . COPD (chronic obstructive pulmonary disease) ([place])   . Diverticulosis of colon (without mention of hemorrhage)   . Esophageal reflux   . Essential hypertension, benign   . Malignant neoplasm of prostate ([place])   . Obstructive sleep apnea   . Osteoarthritis     Past Surgical History:  Procedure Laterality Date  . CHOLECYSTECTOMY N/A 07/03/2014   Procedure: LAPAROSCOPIC CHOLECYSTECTOMY;  Surgeon: [name], MD;  Location: [facility];  Service: General;  Laterality: N/A;  . COLONOSCOPY    . COLONOSCOPY N/A 12/18/2015   Procedure: COLONOSCOPY;  Surgeon: [name], MD;  [facility];  Service: Endoscopy;  Laterality: N/A;  1200  . ESOPHAGEAL DILATION N/A 08/04/2016   Procedure: ESOPHAGEAL DILATION;  Surgeon: [name], MD;  [facility];  Service: Endoscopy;  Laterality: N/A;  . ESOPHAGOGASTRODUODENOSCOPY N/A 08/04/2016   Procedure: ESOPHAGOGASTRODUODENOSCOPY (EGD);  Surgeon: [name], MD;  [facility];  Service: Endoscopy;  Laterality: N/A;  12:00  . ESOPHAGOGASTRODUODENOSCOPY (EGD) WITH ESOPHAGEAL DILATION N/A 06/18/2013   Procedure: ESOPHAGOGASTRODUODENOSCOPY (EGD) WITH ESOPHAGEAL DILATION;  Surgeon: [name], MD;  [facility];  Service: Endoscopy;  Laterality: N/A;  220-rescheduled to 730 Ann to notify pt  . ROTATOR CUFF REPAIR    . TOTAL KNEE ARTHROPLASTY     2000, 2005.     Current Outpatient Medications  Medication Sig Dispense Refill  . albuterol-ipratropium (COMBIVENT) 18-103 MCG/ACT inhaler Inhale 2 puffs into the lungs every 6 (six) hours as needed for wheezing or shortness of breath (uses rarely). UAD PRN    . allopurinol (ZYLOPRIM) 100 MG tablet Take 100 mg by mouth daily.    . Cholecalciferol (VITAMIN D3) 1000 UNITS tablet Take 1,000 Units by mouth daily.      . cyanocobalamin (,VITAMIN B-12,) 1000 MCG/ML injection Inject 1,000 mcg into the muscle every 30 (thirty) days.    . hydroxypropyl methylcellulose / hypromellose (ISOPTO TEARS / GONIOVISC) 2.5 % ophthalmic solution Place 1 drop into both eyes 3 (three) times daily as needed for dry eyes.     . metoprolol tartrate (LOPRESSOR) 25 MG tablet Take 1 tablet by mouth 3 (three) times daily.    . Multiple Vitamins-Minerals (PRESERVISION AREDS) CAPS Take 1 capsule by mouth 2 (two) times daily.     . ondansetron (ZOFRAN) 4 MG tablet TAKE 1 TABLET EVERY 8 HOURS AS NEEDED FOR NAUSEA OR VOMITING 60 tablet 1  . pantoprazole (PROTONIX) 40 MG tablet Take 40 mg by mouth daily before breakfast.     . traMADol (ULTRAM) 50 MG tablet Take 50 mg by mouth 3 (three) times daily as needed.    [name]  traZODone (DESYREL) 100 MG tablet Take 100 mg by mouth at bedtime.    [name] VIAGRA 100 MG tablet Take 100 mg by mouth daily as needed for erectile dysfunction.     [name] warfarin (COUMADIN) 5 MG tablet Take 2.5-5 mg by mouth See admin instructions. Take 5 mg on Mondays . Take 2.5 mg all other days.  UAD - managed by [name]     No current facility-administered medications for this visit.    Allergies:  Sulfonamide derivatives   Social History: The patient   reports that he quit smoking about 52 years ago. His smoking use included cigarettes. He started smoking about 70 years ago. He has a 18.00 pack-year smoking history. He quit smokeless tobacco use about 50 years ago. His smokeless tobacco use included chew. He reports that he does not drink alcohol or use drugs.   ROS:  Please see the history of present illness. Otherwise, complete review of systems is positive for arthritic stiffness.  All other systems are reviewed and negative.   Physical Exam: VS:  BP 126/78   Pulse 80   Ht 5' 9"  (1.753 m)   Wt 236 lb (107 kg)   BMI 34.85 kg/m , BMI Body mass index is 34.85 kg/m.  Wt Readings from Last 3 Encounters:  06/27/17 236 lb (107 kg)  05/26/17 223 lb 3.2 oz (101.2 kg)  12/23/16 232 lb (105.2 kg)    General: Obese male, appears comfortable at rest. HEENT: Conjunctiva and lids normal, oropharynx clear. Neck: Supple, no elevated JVP or carotid bruits, no thyromegaly. Lungs: Clear to auscultation, nonlabored breathing at rest. Cardiac: Irregularly irregular, no S3. Abdomen: Soft, nontender, bowel sounds present. Extremities: No pitting edema, distal pulses 2+.  ECG: I personally reviewed the tracing from 12/23/2016 which showed rate-controlled atrial fibrillation with low voltage.  Recent Labwork:  October 2018: Creatine kinase 70 (CK MM 100%), TSH 1.34, hemoglobin 13.6, platelets 206, ESR 9, CRP 2  Other Studies Reviewed Today:  Echocardiogram  08/06/2015: Study Conclusions  - Left ventricle: The cavity size was normal. Wall thickness was   increased in a pattern of mild LVH. Systolic function was normal.   The estimated ejection fraction was in the range of 60% to 65%.   Wall motion was normal; there were no regional wall motion   abnormalities. The study is not technically sufficient to allow   evaluation of LV diastolic function. - Aortic valve: There was trivial regurgitation. - Aortic root: The aortic root was mildly  ectatic. - Mitral valve: Calcified annulus. There was trivial regurgitation. - Left atrium: The atrium was moderately to severely dilated. - Right ventricle: The cavity size was mildly to moderately   dilated. Systolic function was normal. - Right atrium: The atrium was moderately to severely dilated.   Central venous pressure (est): 3 mm Hg. - Atrial septum: No defect or patent foramen ovale was identified. - Tricuspid valve: There was trivial regurgitation. - Pulmonary arteries: PA peak pressure: 17 mm Hg (S). - Pericardium, extracardiac: There was no pericardial effusion.  Impressions:  - Mild LVH with LVEF 60-65%. Indeterminate diastolic function.   Moderate to severe biatrial enlargement. MAC with trivial mitral   regurgitation. Mildly ectatic aortic root. Sclerotic aortic valve   with trivial aortic regurgitation. Mildly to moderately dilated   RV with normal contraction. Trivial tricuspid regurgitation with   PASP estimated 17 mmHg.  Assessment and Plan:  1. Chronic atrial fibrillation. Plan to continue present dose of metoprolol with adequate  heart rate control. He remains on Coumadin for stroke prophylaxis with follow-up per [name].  2. Essential hypertension, blood pressure well controlled today. No changes were made.  Current medicines were reviewed with the patient today.  Disposition: Follow-up in 6 months.  Signed, Satira Sark, MD, [facility] 06/27/2017 11:44 AM    [facility] [place], [place], [place] [address] Phone: [phone]; Fax: [phone]

## 2017-06-27 NOTE — Patient Instructions (Signed)

## 2017-07-12 DIAGNOSIS — Z87891 Personal history of nicotine dependence: Secondary | ICD-10-CM | POA: Diagnosis not present

## 2017-07-12 DIAGNOSIS — K921 Melena: Secondary | ICD-10-CM | POA: Diagnosis not present

## 2017-07-12 DIAGNOSIS — Z6835 Body mass index (BMI) 35.0-35.9, adult: Secondary | ICD-10-CM | POA: Diagnosis not present

## 2017-07-12 DIAGNOSIS — Z299 Encounter for prophylactic measures, unspecified: Secondary | ICD-10-CM | POA: Diagnosis not present

## 2017-07-12 DIAGNOSIS — R040 Epistaxis: Secondary | ICD-10-CM | POA: Diagnosis not present

## 2017-07-12 DIAGNOSIS — G4730 Sleep apnea, unspecified: Secondary | ICD-10-CM | POA: Diagnosis not present

## 2017-07-12 DIAGNOSIS — I482 Chronic atrial fibrillation: Secondary | ICD-10-CM | POA: Diagnosis not present

## 2017-07-12 DIAGNOSIS — I10 Essential (primary) hypertension: Secondary | ICD-10-CM | POA: Diagnosis not present

## 2017-07-12 DIAGNOSIS — E538 Deficiency of other specified B group vitamins: Secondary | ICD-10-CM | POA: Diagnosis not present

## 2017-07-12 DIAGNOSIS — G4700 Insomnia, unspecified: Secondary | ICD-10-CM | POA: Diagnosis not present

## 2017-07-12 DIAGNOSIS — I5030 Unspecified diastolic (congestive) heart failure: Secondary | ICD-10-CM | POA: Diagnosis not present

## 2017-08-23 DIAGNOSIS — I482 Chronic atrial fibrillation: Secondary | ICD-10-CM | POA: Diagnosis not present

## 2017-08-23 DIAGNOSIS — Z87891 Personal history of nicotine dependence: Secondary | ICD-10-CM | POA: Diagnosis not present

## 2017-08-23 DIAGNOSIS — J449 Chronic obstructive pulmonary disease, unspecified: Secondary | ICD-10-CM | POA: Diagnosis not present

## 2017-08-23 DIAGNOSIS — Z299 Encounter for prophylactic measures, unspecified: Secondary | ICD-10-CM | POA: Diagnosis not present

## 2017-08-23 DIAGNOSIS — I5030 Unspecified diastolic (congestive) heart failure: Secondary | ICD-10-CM | POA: Diagnosis not present

## 2017-08-23 DIAGNOSIS — I739 Peripheral vascular disease, unspecified: Secondary | ICD-10-CM | POA: Diagnosis not present

## 2017-08-23 DIAGNOSIS — E538 Deficiency of other specified B group vitamins: Secondary | ICD-10-CM | POA: Diagnosis not present

## 2017-08-23 DIAGNOSIS — G4730 Sleep apnea, unspecified: Secondary | ICD-10-CM | POA: Diagnosis not present

## 2017-08-23 DIAGNOSIS — Z6836 Body mass index (BMI) 36.0-36.9, adult: Secondary | ICD-10-CM | POA: Diagnosis not present

## 2017-08-23 DIAGNOSIS — I10 Essential (primary) hypertension: Secondary | ICD-10-CM | POA: Diagnosis not present

## 2017-09-01 DIAGNOSIS — I739 Peripheral vascular disease, unspecified: Secondary | ICD-10-CM | POA: Diagnosis not present

## 2017-09-13 DIAGNOSIS — G4730 Sleep apnea, unspecified: Secondary | ICD-10-CM | POA: Diagnosis not present

## 2017-09-14 ENCOUNTER — Other Ambulatory Visit: Payer: Self-pay | Admitting: Cardiology

## 2017-09-14 DIAGNOSIS — G4733 Obstructive sleep apnea (adult) (pediatric): Secondary | ICD-10-CM | POA: Diagnosis not present

## 2017-09-14 DIAGNOSIS — G4761 Periodic limb movement disorder: Secondary | ICD-10-CM | POA: Diagnosis not present

## 2017-09-23 DIAGNOSIS — Z961 Presence of intraocular lens: Secondary | ICD-10-CM | POA: Diagnosis not present

## 2017-09-23 DIAGNOSIS — H4321 Crystalline deposits in vitreous body, right eye: Secondary | ICD-10-CM | POA: Diagnosis not present

## 2017-09-23 DIAGNOSIS — H43813 Vitreous degeneration, bilateral: Secondary | ICD-10-CM | POA: Diagnosis not present

## 2017-09-23 DIAGNOSIS — H353131 Nonexudative age-related macular degeneration, bilateral, early dry stage: Secondary | ICD-10-CM | POA: Diagnosis not present

## 2017-09-30 DIAGNOSIS — I10 Essential (primary) hypertension: Secondary | ICD-10-CM | POA: Diagnosis not present

## 2017-09-30 DIAGNOSIS — Z1339 Encounter for screening examination for other mental health and behavioral disorders: Secondary | ICD-10-CM | POA: Diagnosis not present

## 2017-09-30 DIAGNOSIS — Z79899 Other long term (current) drug therapy: Secondary | ICD-10-CM | POA: Diagnosis not present

## 2017-09-30 DIAGNOSIS — R350 Frequency of micturition: Secondary | ICD-10-CM | POA: Diagnosis not present

## 2017-09-30 DIAGNOSIS — I482 Chronic atrial fibrillation: Secondary | ICD-10-CM | POA: Diagnosis not present

## 2017-09-30 DIAGNOSIS — Z6835 Body mass index (BMI) 35.0-35.9, adult: Secondary | ICD-10-CM | POA: Diagnosis not present

## 2017-09-30 DIAGNOSIS — Z1211 Encounter for screening for malignant neoplasm of colon: Secondary | ICD-10-CM | POA: Diagnosis not present

## 2017-09-30 DIAGNOSIS — Z1331 Encounter for screening for depression: Secondary | ICD-10-CM | POA: Diagnosis not present

## 2017-09-30 DIAGNOSIS — Z299 Encounter for prophylactic measures, unspecified: Secondary | ICD-10-CM | POA: Diagnosis not present

## 2017-09-30 DIAGNOSIS — E538 Deficiency of other specified B group vitamins: Secondary | ICD-10-CM | POA: Diagnosis not present

## 2017-09-30 DIAGNOSIS — R5383 Other fatigue: Secondary | ICD-10-CM | POA: Diagnosis not present

## 2017-09-30 DIAGNOSIS — Z7189 Other specified counseling: Secondary | ICD-10-CM | POA: Diagnosis not present

## 2017-09-30 DIAGNOSIS — Z0000 Encounter for general adult medical examination without abnormal findings: Secondary | ICD-10-CM | POA: Diagnosis not present

## 2017-10-13 DIAGNOSIS — M79671 Pain in right foot: Secondary | ICD-10-CM | POA: Diagnosis not present

## 2017-10-13 DIAGNOSIS — M2041 Other hammer toe(s) (acquired), right foot: Secondary | ICD-10-CM | POA: Diagnosis not present

## 2017-10-17 DIAGNOSIS — I739 Peripheral vascular disease, unspecified: Secondary | ICD-10-CM | POA: Diagnosis not present

## 2017-10-17 DIAGNOSIS — I5030 Unspecified diastolic (congestive) heart failure: Secondary | ICD-10-CM | POA: Diagnosis not present

## 2017-10-17 DIAGNOSIS — J42 Unspecified chronic bronchitis: Secondary | ICD-10-CM | POA: Diagnosis not present

## 2017-10-17 DIAGNOSIS — J449 Chronic obstructive pulmonary disease, unspecified: Secondary | ICD-10-CM | POA: Diagnosis not present

## 2017-10-17 DIAGNOSIS — I700 Atherosclerosis of aorta: Secondary | ICD-10-CM | POA: Diagnosis not present

## 2017-10-17 DIAGNOSIS — Z6836 Body mass index (BMI) 36.0-36.9, adult: Secondary | ICD-10-CM | POA: Diagnosis not present

## 2017-10-17 DIAGNOSIS — R0602 Shortness of breath: Secondary | ICD-10-CM | POA: Diagnosis not present

## 2017-10-17 DIAGNOSIS — I10 Essential (primary) hypertension: Secondary | ICD-10-CM | POA: Diagnosis not present

## 2017-10-17 DIAGNOSIS — Z299 Encounter for prophylactic measures, unspecified: Secondary | ICD-10-CM | POA: Diagnosis not present

## 2017-10-17 DIAGNOSIS — M2550 Pain in unspecified joint: Secondary | ICD-10-CM | POA: Diagnosis not present

## 2017-10-17 DIAGNOSIS — G4730 Sleep apnea, unspecified: Secondary | ICD-10-CM | POA: Diagnosis not present

## 2017-10-17 DIAGNOSIS — I482 Chronic atrial fibrillation: Secondary | ICD-10-CM | POA: Diagnosis not present

## 2017-10-28 DIAGNOSIS — I739 Peripheral vascular disease, unspecified: Secondary | ICD-10-CM | POA: Diagnosis not present

## 2017-10-28 DIAGNOSIS — J449 Chronic obstructive pulmonary disease, unspecified: Secondary | ICD-10-CM | POA: Diagnosis not present

## 2017-10-28 DIAGNOSIS — R0602 Shortness of breath: Secondary | ICD-10-CM | POA: Diagnosis not present

## 2017-10-28 DIAGNOSIS — I482 Chronic atrial fibrillation: Secondary | ICD-10-CM | POA: Diagnosis not present

## 2017-10-28 DIAGNOSIS — Z299 Encounter for prophylactic measures, unspecified: Secondary | ICD-10-CM | POA: Diagnosis not present

## 2017-10-28 DIAGNOSIS — E538 Deficiency of other specified B group vitamins: Secondary | ICD-10-CM | POA: Diagnosis not present

## 2017-10-28 DIAGNOSIS — Z6835 Body mass index (BMI) 35.0-35.9, adult: Secondary | ICD-10-CM | POA: Diagnosis not present

## 2017-10-28 DIAGNOSIS — I5030 Unspecified diastolic (congestive) heart failure: Secondary | ICD-10-CM | POA: Diagnosis not present

## 2017-10-31 DIAGNOSIS — R600 Localized edema: Secondary | ICD-10-CM | POA: Diagnosis not present

## 2017-11-07 DIAGNOSIS — G4730 Sleep apnea, unspecified: Secondary | ICD-10-CM | POA: Diagnosis not present

## 2017-11-07 DIAGNOSIS — G4700 Insomnia, unspecified: Secondary | ICD-10-CM | POA: Diagnosis not present

## 2017-11-11 DIAGNOSIS — I482 Chronic atrial fibrillation: Secondary | ICD-10-CM | POA: Diagnosis not present

## 2017-11-11 DIAGNOSIS — R0789 Other chest pain: Secondary | ICD-10-CM | POA: Diagnosis not present

## 2017-11-11 DIAGNOSIS — I5030 Unspecified diastolic (congestive) heart failure: Secondary | ICD-10-CM | POA: Diagnosis not present

## 2017-11-11 DIAGNOSIS — I739 Peripheral vascular disease, unspecified: Secondary | ICD-10-CM | POA: Diagnosis not present

## 2017-11-11 DIAGNOSIS — Z6836 Body mass index (BMI) 36.0-36.9, adult: Secondary | ICD-10-CM | POA: Diagnosis not present

## 2017-11-11 DIAGNOSIS — I340 Nonrheumatic mitral (valve) insufficiency: Secondary | ICD-10-CM | POA: Diagnosis not present

## 2017-11-11 DIAGNOSIS — J449 Chronic obstructive pulmonary disease, unspecified: Secondary | ICD-10-CM | POA: Diagnosis not present

## 2017-11-11 DIAGNOSIS — I10 Essential (primary) hypertension: Secondary | ICD-10-CM | POA: Diagnosis not present

## 2017-11-11 DIAGNOSIS — Z299 Encounter for prophylactic measures, unspecified: Secondary | ICD-10-CM | POA: Diagnosis not present

## 2017-11-14 DIAGNOSIS — C4442 Squamous cell carcinoma of skin of scalp and neck: Secondary | ICD-10-CM | POA: Diagnosis not present

## 2017-11-14 DIAGNOSIS — L570 Actinic keratosis: Secondary | ICD-10-CM | POA: Diagnosis not present

## 2017-11-14 DIAGNOSIS — D485 Neoplasm of uncertain behavior of skin: Secondary | ICD-10-CM | POA: Diagnosis not present

## 2017-11-14 DIAGNOSIS — Z85828 Personal history of other malignant neoplasm of skin: Secondary | ICD-10-CM | POA: Diagnosis not present

## 2017-11-14 DIAGNOSIS — D0462 Carcinoma in situ of skin of left upper limb, including shoulder: Secondary | ICD-10-CM | POA: Diagnosis not present

## 2017-11-14 DIAGNOSIS — L821 Other seborrheic keratosis: Secondary | ICD-10-CM | POA: Diagnosis not present

## 2017-11-14 DIAGNOSIS — D0439 Carcinoma in situ of skin of other parts of face: Secondary | ICD-10-CM | POA: Diagnosis not present

## 2017-11-17 DIAGNOSIS — I739 Peripheral vascular disease, unspecified: Secondary | ICD-10-CM | POA: Diagnosis not present

## 2017-11-17 DIAGNOSIS — H353131 Nonexudative age-related macular degeneration, bilateral, early dry stage: Secondary | ICD-10-CM | POA: Diagnosis not present

## 2017-11-25 DIAGNOSIS — I739 Peripheral vascular disease, unspecified: Secondary | ICD-10-CM | POA: Diagnosis not present

## 2017-11-25 DIAGNOSIS — E538 Deficiency of other specified B group vitamins: Secondary | ICD-10-CM | POA: Diagnosis not present

## 2017-11-25 DIAGNOSIS — Z6836 Body mass index (BMI) 36.0-36.9, adult: Secondary | ICD-10-CM | POA: Diagnosis not present

## 2017-11-25 DIAGNOSIS — Z299 Encounter for prophylactic measures, unspecified: Secondary | ICD-10-CM | POA: Diagnosis not present

## 2017-11-25 DIAGNOSIS — I5030 Unspecified diastolic (congestive) heart failure: Secondary | ICD-10-CM | POA: Diagnosis not present

## 2017-11-25 DIAGNOSIS — I482 Chronic atrial fibrillation: Secondary | ICD-10-CM | POA: Diagnosis not present

## 2017-12-08 DIAGNOSIS — C4441 Basal cell carcinoma of skin of scalp and neck: Secondary | ICD-10-CM | POA: Diagnosis not present

## 2017-12-08 DIAGNOSIS — L988 Other specified disorders of the skin and subcutaneous tissue: Secondary | ICD-10-CM | POA: Diagnosis not present

## 2017-12-08 DIAGNOSIS — C44629 Squamous cell carcinoma of skin of left upper limb, including shoulder: Secondary | ICD-10-CM | POA: Diagnosis not present

## 2017-12-22 DIAGNOSIS — C44329 Squamous cell carcinoma of skin of other parts of face: Secondary | ICD-10-CM | POA: Diagnosis not present

## 2017-12-26 DIAGNOSIS — I739 Peripheral vascular disease, unspecified: Secondary | ICD-10-CM | POA: Diagnosis not present

## 2017-12-26 DIAGNOSIS — I5030 Unspecified diastolic (congestive) heart failure: Secondary | ICD-10-CM | POA: Diagnosis not present

## 2017-12-26 DIAGNOSIS — Z299 Encounter for prophylactic measures, unspecified: Secondary | ICD-10-CM | POA: Diagnosis not present

## 2017-12-26 DIAGNOSIS — I482 Chronic atrial fibrillation: Secondary | ICD-10-CM | POA: Diagnosis not present

## 2017-12-26 DIAGNOSIS — Z6836 Body mass index (BMI) 36.0-36.9, adult: Secondary | ICD-10-CM | POA: Diagnosis not present

## 2017-12-28 NOTE — Progress Notes (Signed)
Cardiology Office Note  Date: 12/30/2017   ID: Nicholas [name], DOB [birth year], MRN [id number]  PCP: [name], MD  Primary Cardiologist: [name], MD   Chief Complaint  Patient presents with  . Atrial Fibrillation    History of Present Illness: Nicholas [name] is an 84 y.o. male last seen in December 2018.  He presents for a routine visit. Reports chronic fatigue as before, but based on his description of activities he seems to be doing fairly well.  He tries to get out most days to go walking indoors at [place].  He tends to feel better later in the day.  He does not report any exertional chest pain or significant palpitations.  No dizziness or syncope.  He brought in lab work and other studies that he has had in the interim, outlined below.  He continues on Coumadin, followed by [name].  Does not report any bleeding episodes.  I personally reviewed his ECG today which shows rate controlled atrial fibrillation with low voltage.  Past Medical History:  Diagnosis Date  . Atrial fibrillation ([place])   . Cervical spondylosis without myelopathy   . COPD (chronic obstructive pulmonary disease) ([place])   . Diverticulosis of colon (without mention of hemorrhage)   . Esophageal reflux   . Essential hypertension, benign   . Malignant neoplasm of prostate ([place])   . Obstructive sleep apnea   . Osteoarthritis     Past Surgical History:  Procedure Laterality Date  . CHOLECYSTECTOMY N/A 07/03/2014   Procedure: LAPAROSCOPIC CHOLECYSTECTOMY;  Surgeon: [name], MD;  Location: [facility];  Service: General;  Laterality: N/A;  . COLONOSCOPY    . COLONOSCOPY N/A 12/18/2015   Procedure: COLONOSCOPY;  Surgeon: [name], MD;  Location: [facility];  Service: Endoscopy;  Laterality: N/A;  1200  . ESOPHAGEAL DILATION N/A 08/04/2016   Procedure: ESOPHAGEAL DILATION;  Surgeon: [name], MD;  [facility];  Service: Endoscopy;  Laterality: N/A;  .  ESOPHAGOGASTRODUODENOSCOPY N/A 08/04/2016   Procedure: ESOPHAGOGASTRODUODENOSCOPY (EGD);  Surgeon: [name], MD;  [facility];  Service: Endoscopy;  Laterality: N/A;  12:00  . ESOPHAGOGASTRODUODENOSCOPY (EGD) WITH ESOPHAGEAL DILATION N/A 06/18/2013   Procedure: ESOPHAGOGASTRODUODENOSCOPY (EGD) WITH ESOPHAGEAL DILATION;  Surgeon: [name], MD;  [facility];  Service: Endoscopy;  Laterality: N/A;  220-rescheduled to 730 Ann to notify pt  . ROTATOR CUFF REPAIR    . TOTAL KNEE ARTHROPLASTY     2000, 2005.     Current Outpatient Medications  Medication Sig Dispense Refill  . albuterol-ipratropium (COMBIVENT) 18-103 MCG/ACT inhaler Inhale 2 puffs into the lungs every 6 (six) hours as needed for wheezing or shortness of breath (uses rarely). UAD PRN    . allopurinol (ZYLOPRIM) 100 MG tablet Take 100 mg by mouth daily.    . Cholecalciferol (VITAMIN D3) 1000 UNITS tablet Take 1,000 Units by mouth daily.      . cyanocobalamin (,VITAMIN B-12,) 1000 MCG/ML injection Inject 1,000 mcg into the muscle every 30 (thirty) days.    . furosemide (LASIX) 20 MG tablet Take 20 mg by mouth daily.  1  . gabapentin (NEURONTIN) 300 MG capsule Take 1 capsule by mouth at bedtime.    . hydroxypropyl methylcellulose / hypromellose (ISOPTO TEARS / GONIOVISC) 2.5 % ophthalmic solution Place 1 drop into both eyes 3 (three) times daily as needed for dry eyes.     [name] KLOR-CON 10 10 MEQ tablet Take  1 tablet by mouth daily.    . metoprolol tartrate (LOPRESSOR) 25 MG tablet Take 1 tablet by mouth 3 (three) times daily.    . metoprolol tartrate (LOPRESSOR) 25 MG tablet TAKE 1 TABLET THREE TIMES A DAY (DOSE INCREASED) 270 tablet 0  . Multiple Vitamins-Minerals (PRESERVISION AREDS) CAPS Take 1 capsule by mouth 2 (two) times daily.     . ondansetron (ZOFRAN) 4 MG tablet TAKE 1 TABLET EVERY 8 HOURS AS NEEDED FOR NAUSEA OR VOMITING 60 tablet 1  . pantoprazole (PROTONIX) 40 MG tablet Take 40 mg by mouth  daily before breakfast.     . traMADol (ULTRAM) 50 MG tablet Take 50 mg by mouth 3 (three) times daily as needed.    [name] VIAGRA 100 MG tablet Take 100 mg by mouth daily as needed for erectile dysfunction.     [name] warfarin (COUMADIN) 5 MG tablet Take 2.5-5 mg by mouth See admin instructions. Take 5 mg on Mondays . Take 2.5 mg all other days.  UAD - managed by [name]     No current facility-administered medications for this visit.    Allergies:  Sulfonamide derivatives   Social History: The patient  reports that he quit smoking about 52 years ago. His smoking use included cigarettes. He started smoking about 70 years ago. He has a 18.00 pack-year smoking history. He quit smokeless tobacco use about 50 years ago. His smokeless tobacco use included chew. He reports that he does not drink alcohol or use drugs.   ROS:  Please see the history of present illness. Otherwise, complete review of systems is positive for arthritic stiffness, mainly back.  All other systems are reviewed and negative.   Physical Exam: VS:  BP 126/80   Pulse 64   Ht 5\' 9"  (1.753 m)   Wt 235 lb (106.6 kg)   SpO2 98%   BMI 34.70 kg/m , BMI Body mass index is 34.7 kg/m.  Wt Readings from Last 3 Encounters:  12/30/17 235 lb (106.6 kg)  06/27/17 236 lb (107 kg)  05/26/17 223 lb 3.2 oz (101.2 kg)    General: Elderly male, appears comfortable at rest.  Using a cane. HEENT: Conjunctiva and lids normal, oropharynx clear. Neck: Supple, no elevated JVP or carotid bruits, no thyromegaly. Lungs: Clear to auscultation, nonlabored breathing at rest. Cardiac: Irregularly irregular, no S3. Abdomen: Soft, nontender, bowel sounds present. Extremities: No pitting edema, distal pulses 2+. Skin: Warm and dry. Musculoskeletal: No kyphosis. Neuropsychiatric: Alert and oriented x3, affect grossly appropriate.  ECG: I personally reviewed the tracing from 12/23/2016 which showed rate-controlled atrial fibrillation with low  voltage.  Recent Labwork:  April 2019: BUN 15, creatinine 1.05, potassium 4.8, AST 22, ALT 7, hemoglobin 13.9, platelets 210, TSH 1.85, cholesterol 152, triglycerides 73, HDL 55, LDL 82  Other Studies Reviewed Today:  Chest x-ray 10/17/2017 [facility]): Chronic bronchitic changes, stable.  Possible subsegmental atelectasis of the left lung base.  No CHF.  Thoracic aortic atherosclerosis.   Echocardiogram 10/31/2017 [facility]): Mild LVH with LVEF 70 to 75%, mild left atrial enlargement, mildly sclerotic aortic valve with mild aortic regurgitation, mild to moderate mitral regurgitation, mild tricuspid regurgitation, RVSP estimated 20 to 30 mmHg, mildly dilated aortic root measuring 39 mm.  Assessment and Plan:  1.  Permanent atrial fibrillation.  Continue unit for stroke prophylaxis with follow-up with [name].  Heart rate control is adequate on current dose of Lopressor.  2.  Essential hypertension, blood pressure is  well controlled today.  3.  Mild to moderate mitral regurgitation by recent echocardiogram.  Asymptomatic.  Current medicines were reviewed with the patient today.   Orders Placed This Encounter  Procedures  . EKG 12-Lead    Disposition: Follow-up in 6 months.  Signed, Satira Sark, MD, [facility] 12/30/2017 12:58 PM    [facility] [place], [place], [place] [address] Phone: [phone]; Fax: [phone]

## 2017-12-30 ENCOUNTER — Encounter: Payer: Self-pay | Admitting: Cardiology

## 2017-12-30 ENCOUNTER — Ambulatory Visit (INDEPENDENT_AMBULATORY_CARE_PROVIDER_SITE_OTHER): Payer: Medicare Other | Admitting: Cardiology

## 2017-12-30 VITALS — BP 126/80 | HR 64 | Ht 69.0 in | Wt 235.0 lb

## 2017-12-30 DIAGNOSIS — I482 Chronic atrial fibrillation: Secondary | ICD-10-CM

## 2017-12-30 DIAGNOSIS — I10 Essential (primary) hypertension: Secondary | ICD-10-CM | POA: Diagnosis not present

## 2017-12-30 DIAGNOSIS — I340 Nonrheumatic mitral (valve) insufficiency: Secondary | ICD-10-CM | POA: Diagnosis not present

## 2017-12-30 DIAGNOSIS — I4821 Permanent atrial fibrillation: Secondary | ICD-10-CM

## 2017-12-30 NOTE — Patient Instructions (Addendum)

## 2018-01-26 DIAGNOSIS — H16223 Keratoconjunctivitis sicca, not specified as Sjogren's, bilateral: Secondary | ICD-10-CM | POA: Diagnosis not present

## 2018-01-26 DIAGNOSIS — I482 Chronic atrial fibrillation: Secondary | ICD-10-CM | POA: Diagnosis not present

## 2018-01-26 DIAGNOSIS — E538 Deficiency of other specified B group vitamins: Secondary | ICD-10-CM | POA: Diagnosis not present

## 2018-01-26 DIAGNOSIS — I10 Essential (primary) hypertension: Secondary | ICD-10-CM | POA: Diagnosis not present

## 2018-01-26 DIAGNOSIS — H353131 Nonexudative age-related macular degeneration, bilateral, early dry stage: Secondary | ICD-10-CM | POA: Diagnosis not present

## 2018-01-26 DIAGNOSIS — I5030 Unspecified diastolic (congestive) heart failure: Secondary | ICD-10-CM | POA: Diagnosis not present

## 2018-01-26 DIAGNOSIS — Z299 Encounter for prophylactic measures, unspecified: Secondary | ICD-10-CM | POA: Diagnosis not present

## 2018-01-26 DIAGNOSIS — Z6836 Body mass index (BMI) 36.0-36.9, adult: Secondary | ICD-10-CM | POA: Diagnosis not present

## 2018-02-02 DIAGNOSIS — I739 Peripheral vascular disease, unspecified: Secondary | ICD-10-CM | POA: Diagnosis not present

## 2018-03-01 DIAGNOSIS — I5030 Unspecified diastolic (congestive) heart failure: Secondary | ICD-10-CM | POA: Diagnosis not present

## 2018-03-01 DIAGNOSIS — I10 Essential (primary) hypertension: Secondary | ICD-10-CM | POA: Diagnosis not present

## 2018-03-01 DIAGNOSIS — Z6837 Body mass index (BMI) 37.0-37.9, adult: Secondary | ICD-10-CM | POA: Diagnosis not present

## 2018-03-01 DIAGNOSIS — Z299 Encounter for prophylactic measures, unspecified: Secondary | ICD-10-CM | POA: Diagnosis not present

## 2018-03-01 DIAGNOSIS — G4733 Obstructive sleep apnea (adult) (pediatric): Secondary | ICD-10-CM | POA: Diagnosis not present

## 2018-03-01 DIAGNOSIS — I482 Chronic atrial fibrillation: Secondary | ICD-10-CM | POA: Diagnosis not present

## 2018-03-24 DIAGNOSIS — Z23 Encounter for immunization: Secondary | ICD-10-CM | POA: Diagnosis not present

## 2018-03-31 DIAGNOSIS — I482 Chronic atrial fibrillation, unspecified: Secondary | ICD-10-CM | POA: Diagnosis not present

## 2018-03-31 DIAGNOSIS — Z6837 Body mass index (BMI) 37.0-37.9, adult: Secondary | ICD-10-CM | POA: Diagnosis not present

## 2018-03-31 DIAGNOSIS — J449 Chronic obstructive pulmonary disease, unspecified: Secondary | ICD-10-CM | POA: Diagnosis not present

## 2018-03-31 DIAGNOSIS — Z299 Encounter for prophylactic measures, unspecified: Secondary | ICD-10-CM | POA: Diagnosis not present

## 2018-03-31 DIAGNOSIS — I10 Essential (primary) hypertension: Secondary | ICD-10-CM | POA: Diagnosis not present

## 2018-04-24 DIAGNOSIS — L821 Other seborrheic keratosis: Secondary | ICD-10-CM | POA: Diagnosis not present

## 2018-04-24 DIAGNOSIS — L570 Actinic keratosis: Secondary | ICD-10-CM | POA: Diagnosis not present

## 2018-04-24 DIAGNOSIS — Z85828 Personal history of other malignant neoplasm of skin: Secondary | ICD-10-CM | POA: Diagnosis not present

## 2018-04-27 DIAGNOSIS — I739 Peripheral vascular disease, unspecified: Secondary | ICD-10-CM | POA: Diagnosis not present

## 2018-04-28 DIAGNOSIS — Z6837 Body mass index (BMI) 37.0-37.9, adult: Secondary | ICD-10-CM | POA: Diagnosis not present

## 2018-04-28 DIAGNOSIS — I5030 Unspecified diastolic (congestive) heart failure: Secondary | ICD-10-CM | POA: Diagnosis not present

## 2018-04-28 DIAGNOSIS — R5383 Other fatigue: Secondary | ICD-10-CM | POA: Diagnosis not present

## 2018-04-28 DIAGNOSIS — Z299 Encounter for prophylactic measures, unspecified: Secondary | ICD-10-CM | POA: Diagnosis not present

## 2018-04-28 DIAGNOSIS — I482 Chronic atrial fibrillation, unspecified: Secondary | ICD-10-CM | POA: Diagnosis not present

## 2018-04-28 LAB — PROTIME-INR

## 2018-05-01 DIAGNOSIS — Z125 Encounter for screening for malignant neoplasm of prostate: Secondary | ICD-10-CM | POA: Diagnosis not present

## 2018-05-01 DIAGNOSIS — R5383 Other fatigue: Secondary | ICD-10-CM | POA: Diagnosis not present

## 2018-05-04 DIAGNOSIS — R5383 Other fatigue: Secondary | ICD-10-CM | POA: Diagnosis not present

## 2018-05-04 DIAGNOSIS — M25512 Pain in left shoulder: Secondary | ICD-10-CM | POA: Diagnosis not present

## 2018-05-04 DIAGNOSIS — Z6836 Body mass index (BMI) 36.0-36.9, adult: Secondary | ICD-10-CM | POA: Diagnosis not present

## 2018-05-04 DIAGNOSIS — J449 Chronic obstructive pulmonary disease, unspecified: Secondary | ICD-10-CM | POA: Diagnosis not present

## 2018-05-04 DIAGNOSIS — Z299 Encounter for prophylactic measures, unspecified: Secondary | ICD-10-CM | POA: Diagnosis not present

## 2018-05-04 DIAGNOSIS — I5030 Unspecified diastolic (congestive) heart failure: Secondary | ICD-10-CM | POA: Diagnosis not present

## 2018-05-04 DIAGNOSIS — I10 Essential (primary) hypertension: Secondary | ICD-10-CM | POA: Diagnosis not present

## 2018-05-29 DIAGNOSIS — Z299 Encounter for prophylactic measures, unspecified: Secondary | ICD-10-CM | POA: Diagnosis not present

## 2018-05-29 DIAGNOSIS — I5030 Unspecified diastolic (congestive) heart failure: Secondary | ICD-10-CM | POA: Diagnosis not present

## 2018-05-29 DIAGNOSIS — I482 Chronic atrial fibrillation, unspecified: Secondary | ICD-10-CM | POA: Diagnosis not present

## 2018-05-29 DIAGNOSIS — Z6836 Body mass index (BMI) 36.0-36.9, adult: Secondary | ICD-10-CM | POA: Diagnosis not present

## 2018-05-29 DIAGNOSIS — I10 Essential (primary) hypertension: Secondary | ICD-10-CM | POA: Diagnosis not present

## 2018-06-06 DIAGNOSIS — Z96653 Presence of artificial knee joint, bilateral: Secondary | ICD-10-CM | POA: Diagnosis not present

## 2018-06-13 ENCOUNTER — Ambulatory Visit (INDEPENDENT_AMBULATORY_CARE_PROVIDER_SITE_OTHER): Payer: Medicare Other | Admitting: Urology

## 2018-06-13 DIAGNOSIS — Z8546 Personal history of malignant neoplasm of prostate: Secondary | ICD-10-CM

## 2018-06-13 DIAGNOSIS — N400 Enlarged prostate without lower urinary tract symptoms: Secondary | ICD-10-CM | POA: Diagnosis not present

## 2018-06-13 DIAGNOSIS — N5201 Erectile dysfunction due to arterial insufficiency: Secondary | ICD-10-CM | POA: Diagnosis not present

## 2018-06-30 DIAGNOSIS — Z6836 Body mass index (BMI) 36.0-36.9, adult: Secondary | ICD-10-CM | POA: Diagnosis not present

## 2018-06-30 DIAGNOSIS — Z299 Encounter for prophylactic measures, unspecified: Secondary | ICD-10-CM | POA: Diagnosis not present

## 2018-06-30 DIAGNOSIS — I5030 Unspecified diastolic (congestive) heart failure: Secondary | ICD-10-CM | POA: Diagnosis not present

## 2018-06-30 DIAGNOSIS — I482 Chronic atrial fibrillation, unspecified: Secondary | ICD-10-CM | POA: Diagnosis not present

## 2018-06-30 DIAGNOSIS — I10 Essential (primary) hypertension: Secondary | ICD-10-CM | POA: Diagnosis not present

## 2018-06-30 DIAGNOSIS — R5383 Other fatigue: Secondary | ICD-10-CM | POA: Diagnosis not present

## 2018-06-30 DIAGNOSIS — Z789 Other specified health status: Secondary | ICD-10-CM | POA: Diagnosis not present

## 2018-07-02 DIAGNOSIS — R05 Cough: Secondary | ICD-10-CM | POA: Diagnosis not present

## 2018-07-02 DIAGNOSIS — R6889 Other general symptoms and signs: Secondary | ICD-10-CM | POA: Diagnosis not present

## 2018-07-03 NOTE — Progress Notes (Signed)
Cardiology Office Note  Date: 07/04/2018   ID: Nicholas [name], DOB January 26, 1929, MRN [id number]  PCP: [name], MD  Primary Cardiologist: [name], MD   Chief Complaint  Patient presents with  . Atrial Fibrillation    History of Present Illness: Nicholas [name] is an 85 y.o. male last seen in July 2019.  He is here for a routine follow-up visit.  He does not report any progressive sense of palpitations or chest pain.  He was diagnosed recently with bronchitis at an urgent care visit, started on a course of [place].  States that he has had recent low-grade fevers but his cough is improving.  He remains on Coumadin with follow-up per [name].  Cardiac regimen includes Lopressor in addition to Coumadin.  He reports compliance with his medications and has had good heart rate control overall.  Past Medical History:  Diagnosis Date  . Atrial fibrillation ([place])   . Cervical spondylosis without myelopathy   . COPD (chronic obstructive pulmonary disease) ([place])   . Diverticulosis of colon (without mention of hemorrhage)   . Esophageal reflux   . Essential hypertension, benign   . Malignant neoplasm of prostate ([place])   . Obstructive sleep apnea   . Osteoarthritis     Past Surgical History:  Procedure Laterality Date  . CHOLECYSTECTOMY N/A 07/03/2014   Procedure: LAPAROSCOPIC CHOLECYSTECTOMY;  Surgeon: [name], MD;  Location: [facility];  Service: General;  Laterality: N/A;  . COLONOSCOPY    . COLONOSCOPY N/A 12/18/2015   Procedure: COLONOSCOPY;  Surgeon: [name], MD;  [facility];  Service: Endoscopy;  Laterality: N/A;  1200  . ESOPHAGEAL DILATION N/A 08/04/2016   Procedure: ESOPHAGEAL DILATION;  Surgeon: [name], MD;  [facility];  Service: Endoscopy;  Laterality: N/A;  . ESOPHAGOGASTRODUODENOSCOPY N/A 08/04/2016   Procedure: ESOPHAGOGASTRODUODENOSCOPY (EGD);  Surgeon: [name], MD;  [facility];  Service: Endoscopy;   Laterality: N/A;  12:00  . ESOPHAGOGASTRODUODENOSCOPY (EGD) WITH ESOPHAGEAL DILATION N/A 06/18/2013   Procedure: ESOPHAGOGASTRODUODENOSCOPY (EGD) WITH ESOPHAGEAL DILATION;  Surgeon: [name], MD;  [facility];  Service: Endoscopy;  Laterality: N/A;  220-rescheduled to 730 Ann to notify pt  . ROTATOR CUFF REPAIR    . TOTAL KNEE ARTHROPLASTY     2000, 2005.     Current Outpatient Medications  Medication Sig Dispense Refill  . albuterol-ipratropium (COMBIVENT) 18-103 MCG/ACT inhaler Inhale 2 puffs into the lungs every 6 (six) hours as needed for wheezing or shortness of breath (uses rarely). UAD PRN    . allopurinol (ZYLOPRIM) 100 MG tablet Take 100 mg by mouth daily.    . benzonatate (TESSALON) 100 MG capsule Take by mouth 3 (three) times daily as needed for cough.    . cefdinir (OMNICEF) 300 MG capsule Take 300 mg by mouth 2 (two) times daily.    . Cholecalciferol (VITAMIN D3) 1000 UNITS tablet Take 1,000 Units by mouth daily.      . furosemide (LASIX) 20 MG tablet Take 20 mg by mouth daily.  1  . hydroxypropyl methylcellulose / hypromellose (ISOPTO TEARS / GONIOVISC) 2.5 % ophthalmic solution Place 1 drop into both eyes 3 (three) times daily as needed for dry eyes.     [name] KLOR-CON 10 10 MEQ tablet Take 1 tablet by mouth daily.    . metoprolol tartrate (LOPRESSOR) 25 MG tablet Take 1 tablet by mouth 3 (three) times daily.    [name]  metoprolol tartrate (LOPRESSOR) 25 MG tablet TAKE 1 TABLET THREE TIMES A DAY (DOSE INCREASED) 270 tablet 0  . Multiple Vitamins-Minerals (PRESERVISION AREDS) CAPS Take 1 capsule by mouth 2 (two) times daily.     . pantoprazole (PROTONIX) 40 MG tablet Take 40 mg by mouth daily before breakfast.     . traMADol (ULTRAM) 50 MG tablet Take 50 mg by mouth 3 (three) times daily as needed.    [name] VIAGRA 100 MG tablet Take 100 mg by mouth daily as needed for erectile dysfunction.     [name] warfarin (COUMADIN) 5 MG tablet Take 2.5-5 mg by mouth See admin instructions.  Take 5 mg on Mondays . Take 2.5 mg all other days.  UAD - managed by [name]     No current facility-administered medications for this visit.    Allergies:  Sulfonamide derivatives   Social History: The patient  reports that he quit smoking about 53 years ago. His smoking use included cigarettes. He started smoking about 71 years ago. He has a 18.00 pack-year smoking history. He quit smokeless tobacco use about 51 years ago.  His smokeless tobacco use included chew. He reports that he does not drink alcohol or use drugs.   ROS:  Please see the history of present illness. Otherwise, complete review of systems is positive for hearing loss.  All other systems are reviewed and negative.   Physical Exam: VS:  BP 124/78   Pulse 88   Ht 5\' 9"  (1.753 m)   Wt 229 lb (103.9 kg)   SpO2 98%   BMI 33.82 kg/m , BMI Body mass index is 33.82 kg/m.  Wt Readings from Last 3 Encounters:  07/04/18 229 lb (103.9 kg)  12/30/17 235 lb (106.6 kg)  06/27/17 236 lb (107 kg)    General: Elderly male, appears comfortable at rest.  Uses a cane. HEENT: Conjunctiva and lids normal, oropharynx clear. Neck: Supple, no elevated JVP or carotid bruits, no thyromegaly. Lungs: Coarse breath sounds without wheezing, nonlabored breathing at rest. Cardiac: Irregularly irregular, no S3 or significant murmur. Abdomen: Soft, nontender, bowel sounds present. Extremities: Trace ankle edema, distal pulses 2+. Skin: Warm and dry. Musculoskeletal: No kyphosis. Neuropsychiatric: Alert and oriented x3, affect grossly appropriate.  ECG: I personally reviewed the tracing from 12/30/2017 which showed rate controlled atrial fibrillation with low voltage.  Recent Labwork:  November 2019: BUN 13, creatinine 0.95, potassium 4.9, AST 22, ALT 8, Hgb12.8, platelets 186, TSH 1.84  Other Studies Reviewed Today:  Echocardiogram 10/31/2017 [facility]): Mild LVH with LVEF 70 to 75%, mild left atrial enlargement, mildly  sclerotic aortic valve with mild aortic regurgitation, mild to moderate mitral regurgitation, mild tricuspid regurgitation, RVSP estimated 20 to 30 mmHg, mildly dilated aortic root measuring 39 mm.  Assessment and Plan:  1.  Permanent atrial fibrillation.  Continue heart rate control with current dose of Lopressor, remains on Coumadin for stroke prophylaxis with follow-up per [name].  2.  Recent bout of bronchitis, currently completing course of Omnicef.  Follow-up with [name].  3.  Essential hypertension, blood pressure is well controlled today.  No changes were made.  4.  Mild to moderate mitral regurgitation by echocardiogram in May 2019.  No change on examination.  Asymptomatic.  Current medicines were reviewed with the patient today.  Disposition: Follow-up in 6 months.  Signed, Satira Sark, MD, [facility] 07/04/2018 11:38 AM    [facility] [place], [place], [place]  [place] Phone: [phone]; Fax: [phone]

## 2018-07-04 ENCOUNTER — Ambulatory Visit (INDEPENDENT_AMBULATORY_CARE_PROVIDER_SITE_OTHER): Payer: Medicare Other | Admitting: Cardiology

## 2018-07-04 ENCOUNTER — Encounter: Payer: Self-pay | Admitting: Cardiology

## 2018-07-04 VITALS — BP 124/78 | HR 88 | Ht 69.0 in | Wt 229.0 lb

## 2018-07-04 DIAGNOSIS — I340 Nonrheumatic mitral (valve) insufficiency: Secondary | ICD-10-CM

## 2018-07-04 DIAGNOSIS — I4821 Permanent atrial fibrillation: Secondary | ICD-10-CM

## 2018-07-04 DIAGNOSIS — I10 Essential (primary) hypertension: Secondary | ICD-10-CM | POA: Diagnosis not present

## 2018-07-04 DIAGNOSIS — J40 Bronchitis, not specified as acute or chronic: Secondary | ICD-10-CM

## 2018-07-04 NOTE — Patient Instructions (Addendum)

## 2018-07-06 DIAGNOSIS — I739 Peripheral vascular disease, unspecified: Secondary | ICD-10-CM | POA: Diagnosis not present

## 2018-07-17 DIAGNOSIS — Z6836 Body mass index (BMI) 36.0-36.9, adult: Secondary | ICD-10-CM | POA: Diagnosis not present

## 2018-07-17 DIAGNOSIS — I482 Chronic atrial fibrillation, unspecified: Secondary | ICD-10-CM | POA: Diagnosis not present

## 2018-07-17 DIAGNOSIS — I5030 Unspecified diastolic (congestive) heart failure: Secondary | ICD-10-CM | POA: Diagnosis not present

## 2018-07-17 DIAGNOSIS — Z299 Encounter for prophylactic measures, unspecified: Secondary | ICD-10-CM | POA: Diagnosis not present

## 2018-07-17 DIAGNOSIS — I10 Essential (primary) hypertension: Secondary | ICD-10-CM | POA: Diagnosis not present

## 2018-07-17 DIAGNOSIS — J069 Acute upper respiratory infection, unspecified: Secondary | ICD-10-CM | POA: Diagnosis not present

## 2018-07-17 DIAGNOSIS — Z87891 Personal history of nicotine dependence: Secondary | ICD-10-CM | POA: Diagnosis not present

## 2018-07-31 DIAGNOSIS — J449 Chronic obstructive pulmonary disease, unspecified: Secondary | ICD-10-CM | POA: Diagnosis not present

## 2018-07-31 DIAGNOSIS — Z299 Encounter for prophylactic measures, unspecified: Secondary | ICD-10-CM | POA: Diagnosis not present

## 2018-07-31 DIAGNOSIS — I482 Chronic atrial fibrillation, unspecified: Secondary | ICD-10-CM | POA: Diagnosis not present

## 2018-07-31 DIAGNOSIS — I739 Peripheral vascular disease, unspecified: Secondary | ICD-10-CM | POA: Diagnosis not present

## 2018-07-31 DIAGNOSIS — Z6835 Body mass index (BMI) 35.0-35.9, adult: Secondary | ICD-10-CM | POA: Diagnosis not present

## 2018-07-31 DIAGNOSIS — I10 Essential (primary) hypertension: Secondary | ICD-10-CM | POA: Diagnosis not present

## 2018-07-31 DIAGNOSIS — I5030 Unspecified diastolic (congestive) heart failure: Secondary | ICD-10-CM | POA: Diagnosis not present

## 2018-08-11 DIAGNOSIS — J449 Chronic obstructive pulmonary disease, unspecified: Secondary | ICD-10-CM | POA: Diagnosis not present

## 2018-08-24 DIAGNOSIS — H43813 Vitreous degeneration, bilateral: Secondary | ICD-10-CM | POA: Diagnosis not present

## 2018-08-24 DIAGNOSIS — H353131 Nonexudative age-related macular degeneration, bilateral, early dry stage: Secondary | ICD-10-CM | POA: Diagnosis not present

## 2018-08-24 DIAGNOSIS — Z961 Presence of intraocular lens: Secondary | ICD-10-CM | POA: Diagnosis not present

## 2018-08-24 DIAGNOSIS — H4321 Crystalline deposits in vitreous body, right eye: Secondary | ICD-10-CM | POA: Diagnosis not present

## 2018-08-29 DIAGNOSIS — I10 Essential (primary) hypertension: Secondary | ICD-10-CM | POA: Diagnosis not present

## 2018-08-29 DIAGNOSIS — Z6836 Body mass index (BMI) 36.0-36.9, adult: Secondary | ICD-10-CM | POA: Diagnosis not present

## 2018-08-29 DIAGNOSIS — M1990 Unspecified osteoarthritis, unspecified site: Secondary | ICD-10-CM | POA: Diagnosis not present

## 2018-08-29 DIAGNOSIS — Z299 Encounter for prophylactic measures, unspecified: Secondary | ICD-10-CM | POA: Diagnosis not present

## 2018-08-29 DIAGNOSIS — I5030 Unspecified diastolic (congestive) heart failure: Secondary | ICD-10-CM | POA: Diagnosis not present

## 2018-08-29 DIAGNOSIS — I482 Chronic atrial fibrillation, unspecified: Secondary | ICD-10-CM | POA: Diagnosis not present

## 2018-09-29 DIAGNOSIS — I739 Peripheral vascular disease, unspecified: Secondary | ICD-10-CM | POA: Diagnosis not present

## 2018-09-29 DIAGNOSIS — Z6836 Body mass index (BMI) 36.0-36.9, adult: Secondary | ICD-10-CM | POA: Diagnosis not present

## 2018-09-29 DIAGNOSIS — I10 Essential (primary) hypertension: Secondary | ICD-10-CM | POA: Diagnosis not present

## 2018-09-29 DIAGNOSIS — I5030 Unspecified diastolic (congestive) heart failure: Secondary | ICD-10-CM | POA: Diagnosis not present

## 2018-09-29 DIAGNOSIS — I482 Chronic atrial fibrillation, unspecified: Secondary | ICD-10-CM | POA: Diagnosis not present

## 2018-09-29 DIAGNOSIS — Z299 Encounter for prophylactic measures, unspecified: Secondary | ICD-10-CM | POA: Diagnosis not present

## 2018-09-29 DIAGNOSIS — J449 Chronic obstructive pulmonary disease, unspecified: Secondary | ICD-10-CM | POA: Diagnosis not present

## 2018-10-30 DIAGNOSIS — I10 Essential (primary) hypertension: Secondary | ICD-10-CM | POA: Diagnosis not present

## 2018-10-30 DIAGNOSIS — Z299 Encounter for prophylactic measures, unspecified: Secondary | ICD-10-CM | POA: Diagnosis not present

## 2018-10-30 DIAGNOSIS — R29898 Other symptoms and signs involving the musculoskeletal system: Secondary | ICD-10-CM | POA: Diagnosis not present

## 2018-10-30 DIAGNOSIS — J449 Chronic obstructive pulmonary disease, unspecified: Secondary | ICD-10-CM | POA: Diagnosis not present

## 2018-10-30 DIAGNOSIS — G4700 Insomnia, unspecified: Secondary | ICD-10-CM | POA: Diagnosis not present

## 2018-10-30 DIAGNOSIS — Z6836 Body mass index (BMI) 36.0-36.9, adult: Secondary | ICD-10-CM | POA: Diagnosis not present

## 2018-10-30 DIAGNOSIS — I482 Chronic atrial fibrillation, unspecified: Secondary | ICD-10-CM | POA: Diagnosis not present

## 2018-11-02 DIAGNOSIS — I739 Peripheral vascular disease, unspecified: Secondary | ICD-10-CM | POA: Diagnosis not present

## 2018-11-03 ENCOUNTER — Other Ambulatory Visit: Payer: Self-pay | Admitting: Internal Medicine

## 2018-11-03 DIAGNOSIS — R29898 Other symptoms and signs involving the musculoskeletal system: Secondary | ICD-10-CM

## 2018-11-03 DIAGNOSIS — M48061 Spinal stenosis, lumbar region without neurogenic claudication: Secondary | ICD-10-CM

## 2018-11-21 ENCOUNTER — Ambulatory Visit
Admission: RE | Admit: 2018-11-21 | Discharge: 2018-11-21 | Disposition: A | Discharge status: Home / Self Care | Payer: Medicare Other | Source: Ambulatory Visit | Attending: Internal Medicine | Admitting: Internal Medicine

## 2018-11-21 ENCOUNTER — Other Ambulatory Visit: Payer: Self-pay

## 2018-11-21 DIAGNOSIS — R29898 Other symptoms and signs involving the musculoskeletal system: Secondary | ICD-10-CM

## 2018-11-21 DIAGNOSIS — M48061 Spinal stenosis, lumbar region without neurogenic claudication: Secondary | ICD-10-CM

## 2018-11-27 DIAGNOSIS — H353132 Nonexudative age-related macular degeneration, bilateral, intermediate dry stage: Secondary | ICD-10-CM | POA: Diagnosis not present

## 2018-11-27 DIAGNOSIS — H35033 Hypertensive retinopathy, bilateral: Secondary | ICD-10-CM | POA: Diagnosis not present

## 2018-11-27 DIAGNOSIS — H43392 Other vitreous opacities, left eye: Secondary | ICD-10-CM | POA: Diagnosis not present

## 2018-11-27 DIAGNOSIS — H43813 Vitreous degeneration, bilateral: Secondary | ICD-10-CM | POA: Diagnosis not present

## 2018-11-29 DIAGNOSIS — I10 Essential (primary) hypertension: Secondary | ICD-10-CM | POA: Diagnosis not present

## 2018-11-29 DIAGNOSIS — I5030 Unspecified diastolic (congestive) heart failure: Secondary | ICD-10-CM | POA: Diagnosis not present

## 2018-11-29 DIAGNOSIS — Z6836 Body mass index (BMI) 36.0-36.9, adult: Secondary | ICD-10-CM | POA: Diagnosis not present

## 2018-11-29 DIAGNOSIS — R29898 Other symptoms and signs involving the musculoskeletal system: Secondary | ICD-10-CM | POA: Diagnosis not present

## 2018-11-29 DIAGNOSIS — R1310 Dysphagia, unspecified: Secondary | ICD-10-CM | POA: Diagnosis not present

## 2018-11-29 DIAGNOSIS — Z299 Encounter for prophylactic measures, unspecified: Secondary | ICD-10-CM | POA: Diagnosis not present

## 2018-11-29 DIAGNOSIS — I482 Chronic atrial fibrillation, unspecified: Secondary | ICD-10-CM | POA: Diagnosis not present

## 2018-12-06 ENCOUNTER — Other Ambulatory Visit: Payer: Self-pay

## 2018-12-06 ENCOUNTER — Encounter (INDEPENDENT_AMBULATORY_CARE_PROVIDER_SITE_OTHER): Payer: Self-pay | Admitting: Internal Medicine

## 2018-12-06 ENCOUNTER — Ambulatory Visit (INDEPENDENT_AMBULATORY_CARE_PROVIDER_SITE_OTHER): Payer: Medicare Other | Admitting: Internal Medicine

## 2018-12-06 VITALS — BP 160/78 | HR 73 | Temp 98.3°F | Ht 70.0 in | Wt 228.8 lb

## 2018-12-06 DIAGNOSIS — R1310 Dysphagia, unspecified: Secondary | ICD-10-CM | POA: Diagnosis not present

## 2018-12-06 DIAGNOSIS — R1319 Other dysphagia: Secondary | ICD-10-CM

## 2018-12-06 NOTE — Patient Instructions (Signed)
DG Esophagram.  

## 2018-12-06 NOTE — Progress Notes (Signed)
Subjective:    Patient ID: Nicholas [name], male    DOB: 1929/04/05, 85 y.o.   MRN: [id number]  HPI  Referred by [name] for dysphagia. States having trouble swallowing pills. Foods feel like they are slow to go down. Also c/o some flatus. [name]  He take Simethicone a couple of times a day. Has a BM daily after breakfast. No melena or BRRB.  Appetite is okay.    Underwent an EGD/ED in 2018 for dysphagia. Normal esophagus. No endoscopic esophageal abnormality to explain patient's dysphagia. Esophagus dilated. 12 mm olive snared from gastric antrum.     Maintained on Warfarin for A-Fib.  Review of Systems Past Medical History:  Diagnosis Date  . Atrial fibrillation ([place])   . Cervical spondylosis without myelopathy   . COPD (chronic obstructive pulmonary disease) ([place])   . Diverticulosis of colon (without mention of hemorrhage)   . Esophageal reflux   . Essential hypertension, benign   . Malignant neoplasm of prostate ([place])   . Obstructive sleep apnea   . Osteoarthritis     Past Surgical History:  Procedure Laterality Date  . CHOLECYSTECTOMY N/A 07/03/2014   Procedure: LAPAROSCOPIC CHOLECYSTECTOMY;  Surgeon: [name], MD;  Location: [facility];  Service: General;  Laterality: N/A;  . COLONOSCOPY    . COLONOSCOPY N/A 12/18/2015   Procedure: COLONOSCOPY;  Surgeon: [name], MD;  [facility];  Service: Endoscopy;  Laterality: N/A;  1200  . ESOPHAGEAL DILATION N/A 08/04/2016   Procedure: ESOPHAGEAL DILATION;  Surgeon: [name], MD;  [facility];  Service: Endoscopy;  Laterality: N/A;  . ESOPHAGOGASTRODUODENOSCOPY N/A 08/04/2016   Procedure: ESOPHAGOGASTRODUODENOSCOPY (EGD);  Surgeon: [name], MD;  [facility];  Service: Endoscopy;  Laterality: N/A;  12:00  . ESOPHAGOGASTRODUODENOSCOPY (EGD) WITH ESOPHAGEAL DILATION N/A 06/18/2013   Procedure: ESOPHAGOGASTRODUODENOSCOPY (EGD) WITH ESOPHAGEAL DILATION;  Surgeon: [name], MD;   [facility];  Service: Endoscopy;  Laterality: N/A;  220-rescheduled to 730 Ann to notify pt  . ROTATOR CUFF REPAIR    . TOTAL KNEE ARTHROPLASTY     2000, 2005.     Allergies  Allergen Reactions  . Sulfonamide Derivatives Rash    Current Outpatient Medications on File Prior to Visit  Medication Sig Dispense Refill  . albuterol-ipratropium (COMBIVENT) 18-103 MCG/ACT inhaler Inhale 2 puffs into the lungs every 6 (six) hours as needed for wheezing or shortness of breath (uses rarely). UAD PRN    . allopurinol (ZYLOPRIM) 100 MG tablet Take 100 mg by mouth daily.    . benzonatate (TESSALON) 100 MG capsule Take by mouth 3 (three) times daily as needed for cough.    . cefdinir (OMNICEF) 300 MG capsule Take 300 mg by mouth 2 (two) times daily.    . Cholecalciferol (VITAMIN D3) 1000 UNITS tablet Take 1,000 Units by mouth daily.      . furosemide (LASIX) 20 MG tablet Take 20 mg by mouth daily.  1  . hydroxypropyl methylcellulose / hypromellose (ISOPTO TEARS / GONIOVISC) 2.5 % ophthalmic solution Place 1 drop into both eyes 3 (three) times daily as needed for dry eyes.     [name] KLOR-CON 10 10 MEQ tablet Take 1 tablet by mouth daily.    . metoprolol tartrate (LOPRESSOR) 25 MG tablet Take 1 tablet by mouth 3 (three) times daily.    . metoprolol tartrate (LOPRESSOR) 25 MG tablet TAKE 1 TABLET THREE TIMES A DAY (DOSE INCREASED) 270  tablet 0  . Multiple Vitamins-Minerals (PRESERVISION AREDS) CAPS Take 1 capsule by mouth 2 (two) times daily.     . RABEprazole (ACIPHEX) 20 MG tablet Take 20 mg by mouth daily.    . traMADol (ULTRAM) 50 MG tablet Take 50 mg by mouth 3 (three) times daily as needed.    [name] VIAGRA 100 MG tablet Take 100 mg by mouth daily as needed for erectile dysfunction.     [name] warfarin (COUMADIN) 5 MG tablet Take 2.5-5 mg by mouth See admin instructions. Take 5 mg on Mondays . Take 2.5 mg all other days.  UAD - managed by [name]     No current facility-administered medications on  file prior to visit.         Objective:   Physical Exam Blood pressure (!) 160/78, pulse 73, temperature 98.3 F (36.8 C), height 5\' 10"  (1.778 m), weight 228 lb 12.8 oz (103.8 kg). Alert and oriented. Skin warm and dry. Oral mucosa is moist.   . Sclera anicteric, conjunctivae is pink. Thyroid not enlarged. No cervical lymphadenopathy. Lungs clear. Heart regular rate and rhythm.  Abdomen is soft. Bowel sounds are positive. No hepatomegaly. No abdominal masses felt. No tenderness.  No edema to lower extremities.        Assessment & Plan:  Dysphagia. Am going to get a DG Esophagram.

## 2018-12-12 DIAGNOSIS — L570 Actinic keratosis: Secondary | ICD-10-CM | POA: Diagnosis not present

## 2018-12-13 ENCOUNTER — Other Ambulatory Visit (INDEPENDENT_AMBULATORY_CARE_PROVIDER_SITE_OTHER): Payer: Self-pay | Admitting: Internal Medicine

## 2018-12-13 ENCOUNTER — Ambulatory Visit (HOSPITAL_COMMUNITY)
Admission: RE | Admit: 2018-12-13 | Discharge: 2018-12-13 | Disposition: A | Discharge status: Home / Self Care | Payer: Medicare Other | Source: Ambulatory Visit | Attending: Internal Medicine | Admitting: Internal Medicine

## 2018-12-13 ENCOUNTER — Other Ambulatory Visit: Payer: Self-pay

## 2018-12-13 DIAGNOSIS — R1310 Dysphagia, unspecified: Secondary | ICD-10-CM | POA: Diagnosis not present

## 2018-12-13 DIAGNOSIS — T17400A Unspecified foreign body in trachea causing asphyxiation, initial encounter: Secondary | ICD-10-CM | POA: Diagnosis not present

## 2018-12-13 DIAGNOSIS — R1319 Other dysphagia: Secondary | ICD-10-CM

## 2018-12-18 ENCOUNTER — Other Ambulatory Visit (INDEPENDENT_AMBULATORY_CARE_PROVIDER_SITE_OTHER): Payer: Self-pay | Admitting: Internal Medicine

## 2018-12-18 ENCOUNTER — Telehealth (INDEPENDENT_AMBULATORY_CARE_PROVIDER_SITE_OTHER): Payer: Self-pay | Admitting: Internal Medicine

## 2018-12-18 DIAGNOSIS — R1319 Other dysphagia: Secondary | ICD-10-CM

## 2018-12-18 DIAGNOSIS — R1310 Dysphagia, unspecified: Secondary | ICD-10-CM

## 2018-12-18 DIAGNOSIS — R110 Nausea: Secondary | ICD-10-CM

## 2018-12-18 MED ORDER — ONDANSETRON HCL 4 MG PO TABS: 4.0000 mg | ORAL_TABLET | Freq: Three times a day (TID) | ORAL | 2 refills | Status: AC | PRN

## 2018-12-18 NOTE — Telephone Encounter (Signed)
Rx for Zofran sent to his pharmacy

## 2018-12-21 ENCOUNTER — Other Ambulatory Visit (HOSPITAL_COMMUNITY): Payer: Self-pay | Admitting: Specialist

## 2018-12-21 DIAGNOSIS — R1319 Other dysphagia: Secondary | ICD-10-CM

## 2019-01-02 DIAGNOSIS — Z299 Encounter for prophylactic measures, unspecified: Secondary | ICD-10-CM | POA: Diagnosis not present

## 2019-01-02 DIAGNOSIS — M79606 Pain in leg, unspecified: Secondary | ICD-10-CM | POA: Diagnosis not present

## 2019-01-02 DIAGNOSIS — R202 Paresthesia of skin: Secondary | ICD-10-CM | POA: Diagnosis not present

## 2019-01-02 DIAGNOSIS — I739 Peripheral vascular disease, unspecified: Secondary | ICD-10-CM | POA: Diagnosis not present

## 2019-01-02 DIAGNOSIS — I482 Chronic atrial fibrillation, unspecified: Secondary | ICD-10-CM | POA: Diagnosis not present

## 2019-01-02 DIAGNOSIS — I10 Essential (primary) hypertension: Secondary | ICD-10-CM | POA: Diagnosis not present

## 2019-01-02 DIAGNOSIS — Z6836 Body mass index (BMI) 36.0-36.9, adult: Secondary | ICD-10-CM | POA: Diagnosis not present

## 2019-01-03 ENCOUNTER — Ambulatory Visit (HOSPITAL_COMMUNITY)
Admission: RE | Admit: 2019-01-03 | Discharge: 2019-01-03 | Disposition: A | Discharge status: Home / Self Care | Payer: Medicare Other | Source: Ambulatory Visit | Attending: Internal Medicine | Admitting: Internal Medicine

## 2019-01-03 ENCOUNTER — Other Ambulatory Visit: Payer: Self-pay

## 2019-01-03 ENCOUNTER — Ambulatory Visit (HOSPITAL_COMMUNITY): Payer: Medicare Other | Attending: Internal Medicine | Admitting: Speech Pathology

## 2019-01-03 ENCOUNTER — Encounter (HOSPITAL_COMMUNITY): Payer: Self-pay | Admitting: Speech Pathology

## 2019-01-03 DIAGNOSIS — R1312 Dysphagia, oropharyngeal phase: Secondary | ICD-10-CM | POA: Diagnosis not present

## 2019-01-03 DIAGNOSIS — R1310 Dysphagia, unspecified: Secondary | ICD-10-CM | POA: Diagnosis not present

## 2019-01-03 DIAGNOSIS — R1319 Other dysphagia: Secondary | ICD-10-CM | POA: Insufficient documentation

## 2019-01-03 NOTE — Therapy (Signed)
[place], [place], [address] Phone: [phone]   Fax:  [phone]  Modified Barium Swallow  Patient Details  Name: Nicholas [name] MRN: [id number] Date of Birth: 01-17-1929 No data recorded  Encounter Date: 01/03/2019  End of Session - 01/03/19 1230    Visit Number  1    Number of Visits  1    Authorization Type  Medicare    SLP Start Time  1037    SLP Stop Time   1116    SLP Time Calculation (min)  39 min    Activity Tolerance  Patient tolerated treatment well       Past Medical History:  Diagnosis Date  . Atrial fibrillation ([place])   . Cervical spondylosis without myelopathy   . COPD (chronic obstructive pulmonary disease) ([place])   . Diverticulosis of colon (without mention of hemorrhage)   . Esophageal reflux   . Essential hypertension, benign   . Malignant neoplasm of prostate ([place])   . Obstructive sleep apnea   . Osteoarthritis     Past Surgical History:  Procedure Laterality Date  . CHOLECYSTECTOMY N/A 07/03/2014   Procedure: LAPAROSCOPIC CHOLECYSTECTOMY;  Surgeon: [name], MD;  Location: [facility];  Service: General;  Laterality: N/A;  . COLONOSCOPY    . COLONOSCOPY N/A 12/18/2015   Procedure: COLONOSCOPY;  Surgeon: [name], MD;  Location: [facility];  Service: Endoscopy;  Laterality: N/A;  1200  . ESOPHAGEAL DILATION N/A 08/04/2016   Procedure: ESOPHAGEAL DILATION;  Surgeon: [name], MD;  Location: [facility];  Service: Endoscopy;  Laterality: N/A;  . ESOPHAGOGASTRODUODENOSCOPY N/A 08/04/2016   Procedure: ESOPHAGOGASTRODUODENOSCOPY (EGD);  Surgeon: [name], MD;  [facility];  Service: Endoscopy;  Laterality: N/A;  12:00  . ESOPHAGOGASTRODUODENOSCOPY (EGD) WITH ESOPHAGEAL DILATION N/A 06/18/2013   Procedure: ESOPHAGOGASTRODUODENOSCOPY (EGD) WITH ESOPHAGEAL DILATION;  Surgeon: [name], MD;  [facility]: [facility];  Service: Endoscopy;  Laterality:  N/A;  220-rescheduled to 730 Ann to notify pt  . ROTATOR CUFF REPAIR    . TOTAL KNEE ARTHROPLASTY     2000, 2005.     There were no vitals filed for this visit.  Subjective Assessment - 01/03/19 1209    Subjective  "I have trouble with chicken and other meats."    Special Tests  MBSS    Currently in Pain?  No/denies          General - 01/03/19 1210      General Information   Date of Onset  12/13/18    HPI  Nicholas [name] is an 85 yo male who was referred by Deberah Castle for MBSS due to aspiration noted on barium swallow on 12/13/18 and c/o difficulty swallowing meats and large pills. Pt with cervical spondylosis.   Type of Study  MBS-Modified Barium Swallow Study    Previous Swallow Assessment  BaSw 12/13/18; EGD 2018    Diet Prior to this Study  Regular;Thin liquids    Temperature Spikes Noted  No    Respiratory Status  Room air    History of Recent Intubation  No    Behavior/Cognition  Alert;Cooperative;Pleasant mood    Oral Cavity Assessment  Within Functional Limits    Oral Care Completed by SLP  No    Oral Cavity - Dentition  Adequate natural dentition    Vision  Functional for self feeding    Self-Feeding Abilities  Able to feed self  Patient Positioning  Upright in chair    Baseline Vocal Quality  Normal    Volitional Cough  Strong    Volitional Swallow  Able to elicit    Anatomy  Other (Comment)   bony protrusions along C4, C6-7   Pharyngeal Secretions  Not observed secondary MBS         Oral Preparation/Oral Phase - 01/03/19 1221      Oral Preparation/Oral Phase   Oral Phase  Within functional limits      Electrical stimulation - Oral Phase   Was Electrical Stimulation Used  No       Pharyngeal Phase - 01/03/19 1222      Pharyngeal Phase   Pharyngeal Phase  Impaired      Pharyngeal - Thin   Pharyngeal- Thin Teaspoon  Within functional limits   No residuals, however Pt swallowed 6x for one tsp   Pharyngeal- Thin Cup  Swallow initiation at  vallecula;Reduced epiglottic inversion;Pharyngeal residue - valleculae;Pharyngeal residue - cp segment;Reduced pharyngeal peristalsis    Pharyngeal- Thin Straw  Swallow initiation at pyriform sinus;Reduced epiglottic inversion;Reduced pharyngeal peristalsis;Pharyngeal residue - cp segment;Reduced airway/laryngeal closure;Penetration/Aspiration during swallow;Pharyngeal residue - valleculae    Pharyngeal  Material enters airway, CONTACTS cords and then ejected out      Pharyngeal - Solids   Pharyngeal- Puree  Within functional limits    Pharyngeal- Regular  Pharyngeal residue - cp segment    Pharyngeal- Pill  Swallow initiation at vallecula   stasis of pill in valleculae briefly     Pharyngeal Phase - Comment   Pharyngeal Comment  bony protrusions near C-4 and C6-7 impede into pharyngeal space and cause incomplete epiglottic deflection, base of tongue to posterior pharyngeal wall approximation, and decreased pharyngeal pressure      Electrical Stimulation - Pharyngeal Phase   Was Electrical Stimulation Used  No       Cricopharyngeal Phase - 01/03/19 1228      Cervical Esophageal Phase   Cervical Esophageal Phase  Impaired      Cervical Esophageal Phase - Comment   Other Esophageal Phase Observations  delayed emptying of barium noted in esophagus         Plan - 01/03/19 1231    Clinical Impression Statement  Pt presents with mild pharyngeal phase dysphagia and suspected esophageal dysphagia characterized by normal oral phase, timely swallow trigger with the exception of swallow trigger at the pyriforms with straw sips, incomplete epiglottic deflection due to impedence into pharyngeal space from bony prominence along C3-4 (also C6-7), resulting in some liquid deflection/retrograde movement from prominence back up toward superior posterior pharyngeal wall with straw sips. Pt appears to have adequate pharyngeal strength and compensates well with only trace/min residuals with liquids (ok  with solids and puree). Pt with penetration of thins to the vocal folds when taking sequential straw sips of thin during the swallow, but was spontaneously removed. The barium tablet was transiently delayed between the tip of the epiglottis and pharyngeal/bony prominence, but cleared with liquid wash. Esophageal sweep revealed delayed emptying of bolus with primary peristaltic wave, but eventually cleared. I suspect that Pt's symptoms of difficulty with meats and large pills is consistent with pharyngeal impedence from bony prominence near C3-4 and C6-7 necessitating several swallows to clear. Incidentally, Pt swallowed 6x for one teaspoon of thin liquid despite no residuals noted. This study was reviewed with Pt and questions were answered. Recommend regular textures and thin liquids, avoid straws, and consider chopping meats well and add  moisture to facilitate pharyngeal clearance; liquid wash. Pt in agreement with plan of care and no further SLP services indicated at this time. Pt was given my contact information should he have further questions.    Consulted and Agree with Plan of Care  Patient       Patient will benefit from skilled therapeutic intervention in order to improve the following deficits and impairments:   1. Dysphagia, oropharyngeal phase       Recommendations/Treatment - 01/03/19 1229      Swallow Evaluation Recommendations   SLP Diet Recommendations  Age appropriate regular;Thin    Liquid Administration via  Cup;No straw    Medication Administration  Whole meds with liquid   consider large pills in puree/ice cream   Supervision  Patient able to self feed    Compensations  Follow solids with liquid    Postural Changes  Seated upright at 90 degrees;Remain upright for at least 30 minutes after feeds/meals         Problem List Patient Active Problem List   Diagnosis Date Noted  . Esophageal dysphagia 05/27/2016  . History of colonic polyps 10/23/2015  . Chronic  cholecystitis 07/03/2014  . Loss of weight 01/14/2014  . Nausea alone 01/14/2014  . Aortic valve disorder 01/06/2011  . Shortness of breath 01/06/2011  . Atrial fibrillation [facility]) 03/28/2009   Thank you,  Genene Churn, [place]  [facility] 01/03/2019, 12:32 PM  [place] [address] [place], [place], [address] Phone: [phone]   Fax:  [phone]  Name: Nicholas [name] MRN: [id number] Date of Birth: [birth year]

## 2019-01-04 ENCOUNTER — Encounter: Payer: Self-pay | Admitting: Neurology

## 2019-01-15 DIAGNOSIS — I70211 Atherosclerosis of native arteries of extremities with intermittent claudication, right leg: Secondary | ICD-10-CM | POA: Diagnosis not present

## 2019-01-19 ENCOUNTER — Telehealth: Payer: Self-pay | Admitting: Cardiology

## 2019-01-19 NOTE — Telephone Encounter (Signed)
Virtual Visit Pre-Appointment Phone Call  "(Name), I am calling you today to discuss your upcoming appointment. We are currently trying to limit exposure to the virus that causes COVID-19 by seeing patients at home rather than in the office."  1. "What is the BEST phone number to call the day of the visit?" - include this in appointment notes  2. Do you have or have access to (through a family member/friend) a smartphone with video capability that we can use for your visit?" a. If yes - list this number in appt notes as cell (if different from BEST phone #) and list the appointment type as a VIDEO visit in appointment notes b. If no - list the appointment type as a PHONE visit in appointment notes  3. Confirm consent - "In the setting of the current Covid19 crisis, you are scheduled for a (phone or video) visit with your provider on (date) at (time).  Just as we do with many in-office visits, in order for you to participate in this visit, we must obtain consent.  If you'd like, I can send this to your mychart (if signed up) or email for you to review.  Otherwise, I can obtain your verbal consent now.  All virtual visits are billed to your insurance company just like a normal visit would be.  By agreeing to a virtual visit, we'd like you to understand that the technology does not allow for your provider to perform an examination, and thus may limit your provider's ability to fully assess your condition. If your provider identifies any concerns that need to be evaluated in person, we will make arrangements to do so.  Finally, though the technology is pretty good, we cannot assure that it will always work on either your or our end, and in the setting of a video visit, we may have to convert it to a phone-only visit.  In either situation, we cannot ensure that we have a secure connection.  Are you willing to proceed?" STAFF: Did the patient verbally acknowledge consent to telehealth visit? Document  YES/NO here: yes  4. Advise patient to be prepared - "Two hours prior to your appointment, go ahead and check your blood pressure, pulse, oxygen saturation, and your weight (if you have the equipment to check those) and write them all down. When your visit starts, your provider will ask you for this information. If you have an Apple Watch or Kardia device, please plan to have heart rate information ready on the day of your appointment. Please have a pen and paper handy nearby the day of the visit as well."  5. Give patient instructions for MyChart download to smartphone OR Doximity/Doxy.me as below if video visit (depending on what platform provider is using)  6. Inform patient they will receive a phone call 15 minutes prior to their appointment time (may be from unknown caller ID) so they should be prepared to answer    TELEPHONE CALL NOTE  Nicholas [name] has been deemed a candidate for a follow-up tele-health visit to limit community exposure during the Covid-19 pandemic. I spoke with the patient via phone to ensure availability of phone/video source, confirm preferred email & phone number, and discuss instructions and expectations.  I reminded Nicholas [name] to be prepared with any vital sign and/or heart rhythm information that could potentially be obtained via home monitoring, at the time of his visit. I reminded Nicholas [name] to expect a phone call prior to  his visit.  Nicholas [name] 01/19/2019 12:46 PM   INSTRUCTIONS FOR DOWNLOADING THE MYCHART APP TO SMARTPHONE  - The patient must first make sure to have activated MyChart and know their login information - If Apple, go to [employer] and type in MyChart in the search bar and download the app. If Android, ask patient to go to [employer] and type in [place] in the search bar and download the app. The app is free but as with any other app downloads, their phone may require them to verify saved payment information or Apple/Android  password.  - The patient will need to then log into the app with their MyChart username and password, and select [facility] as their healthcare provider to link the account. When it is time for your visit, go to the MyChart app, find appointments, and click Begin Video Visit. Be sure to Select Allow for your device to access the Microphone and Camera for your visit. You will then be connected, and your provider will be with you shortly.  **If they have any issues connecting, or need assistance please contact MyChart service desk [phone])**  **If using a computer, in order to ensure the best quality for their visit they will need to use either of the following Internet Browsers: [employer], or Google Chrome**  IF USING DOXIMITY or DOXY.ME - The patient will receive a link just prior to their visit by text.     FULL LENGTH CONSENT FOR TELE-HEALTH VISIT   I hereby voluntarily request, consent and authorize [facility] and its employed or contracted physicians, physician assistants, nurse practitioners or other licensed health care professionals (the Practitioner), to provide me with telemedicine health care services (the Services") as deemed necessary by the treating Practitioner. I acknowledge and consent to receive the Services by the Practitioner via telemedicine. I understand that the telemedicine visit will involve communicating with the Practitioner through live audiovisual communication technology and the disclosure of certain medical information by electronic transmission. I acknowledge that I have been given the opportunity to request an in-person assessment or other available alternative prior to the telemedicine visit and am voluntarily participating in the telemedicine visit.  I understand that I have the right to withhold or withdraw my consent to the use of telemedicine in the course of my care at any time, without affecting my right to future care or treatment,  and that the Practitioner or I may terminate the telemedicine visit at any time. I understand that I have the right to inspect all information obtained and/or recorded in the course of the telemedicine visit and may receive copies of available information for a reasonable fee.  I understand that some of the potential risks of receiving the Services via telemedicine include:   Delay or interruption in medical evaluation due to technological equipment failure or disruption;  Information transmitted may not be sufficient (e.g. poor resolution of images) to allow for appropriate medical decision making by the Practitioner; and/or   In rare instances, security protocols could fail, causing a breach of personal health information.  Furthermore, I acknowledge that it is my responsibility to provide information about my medical history, conditions and care that is complete and accurate to the best of my ability. I acknowledge that Practitioner's advice, recommendations, and/or decision may be based on factors not within their control, such as incomplete or inaccurate data provided by me or distortions of diagnostic images or specimens that may result from electronic transmissions. I  understand that the practice of medicine is not an exact science and that Practitioner makes no warranties or guarantees regarding treatment outcomes. I acknowledge that I will receive a copy of this consent concurrently upon execution via email to the email address I last provided but may also request a printed copy by calling the office of [facility].    I understand that my insurance will be billed for this visit.   I have read or had this consent read to me.  I understand the contents of this consent, which adequately explains the benefits and risks of the Services being provided via telemedicine.   I have been provided ample opportunity to ask questions regarding this consent and the Services and have had my questions  answered to my satisfaction.  I give my informed consent for the services to be provided through the use of telemedicine in my medical care  By participating in this telemedicine visit I agree to the above.

## 2019-01-25 DIAGNOSIS — I739 Peripheral vascular disease, unspecified: Secondary | ICD-10-CM | POA: Diagnosis not present

## 2019-01-26 ENCOUNTER — Telehealth (INDEPENDENT_AMBULATORY_CARE_PROVIDER_SITE_OTHER): Payer: Medicare Other | Admitting: Cardiology

## 2019-01-26 ENCOUNTER — Encounter: Payer: Self-pay | Admitting: Cardiology

## 2019-01-26 VITALS — BP 125/75 | HR 70 | Ht 69.0 in | Wt 222.0 lb

## 2019-01-26 DIAGNOSIS — I340 Nonrheumatic mitral (valve) insufficiency: Secondary | ICD-10-CM

## 2019-01-26 DIAGNOSIS — I4821 Permanent atrial fibrillation: Secondary | ICD-10-CM | POA: Diagnosis not present

## 2019-01-26 DIAGNOSIS — I10 Essential (primary) hypertension: Secondary | ICD-10-CM | POA: Diagnosis not present

## 2019-01-26 NOTE — Patient Instructions (Signed)

## 2019-01-26 NOTE — Progress Notes (Signed)
Virtual Visit via Telephone Note   This visit type was conducted due to national recommendations for restrictions regarding the COVID-19 Pandemic (e.g. social distancing) in an effort to limit this patient's exposure and mitigate transmission in our community.  Due to his co-morbid illnesses, this patient is at least at moderate risk for complications without adequate follow up.  This format is felt to be most appropriate for this patient at this time.  The patient did not have access to video technology/had technical difficulties with video requiring transitioning to audio format only (telephone).  All issues noted in this document were discussed and addressed.  No physical exam could be performed with this format.  Please refer to the patient's chart for his  consent to telehealth for [facility]   Date:  01/26/2019   ID:  Nicholas [name], DOB 07-18-1928, MRN [id number]  Patient Location: Home Provider Location: Office  PCP:  [name], MD  Cardiologist:  [name], MD Electrophysiologist:  None   Evaluation Performed:  Follow-Up Visit  Chief Complaint:   Cardiac follow-up  History of Present Illness:    Nicholas [name] is an 85 y.o. male last seen in January.  He did not have a low access and we spoke by phone today.  He does not report any new symptoms, stable dyspnea on exertion, no palpitations or chest pain.  He has been walking in his driveway in the early morning for exercise.  He is on Coumadin with follow-up by [name].  He does not report any falls or spontaneous bleeding problems.  We went over his remaining medications which are stable from a cardiac perspective and outlined below.  The patient does not have symptoms concerning for COVID-19 infection (fever, chills, cough, or new shortness of breath).  He states that he wears a mask when he goes out.    Past Medical History:  Diagnosis Date   Atrial fibrillation ([place])    Cervical spondylosis without  myelopathy    COPD (chronic obstructive pulmonary disease) ([place])    Diverticulosis of colon (without mention of hemorrhage)    Esophageal reflux    Essential hypertension    Malignant neoplasm of prostate ([place])    Obstructive sleep apnea    Osteoarthritis    Past Surgical History:  Procedure Laterality Date   CHOLECYSTECTOMY N/A 07/03/2014   Procedure: LAPAROSCOPIC CHOLECYSTECTOMY;  Surgeon: [name], MD;  Location: [facility];  Service: General;  Laterality: N/A;   COLONOSCOPY     COLONOSCOPY N/A 12/18/2015   Procedure: COLONOSCOPY;  Surgeon: [name], MD;  [facility];  Service: Endoscopy;  Laterality: N/A;  1200   ESOPHAGEAL DILATION N/A 08/04/2016   Procedure: ESOPHAGEAL DILATION;  Surgeon: [name], MD;  [facility];  Service: Endoscopy;  Laterality: N/A;   ESOPHAGOGASTRODUODENOSCOPY N/A 08/04/2016   Procedure: ESOPHAGOGASTRODUODENOSCOPY (EGD);  Surgeon: [name], MD;  [facility];  Service: Endoscopy;  Laterality: N/A;  12:00   ESOPHAGOGASTRODUODENOSCOPY (EGD) WITH ESOPHAGEAL DILATION N/A 06/18/2013   Procedure: ESOPHAGOGASTRODUODENOSCOPY (EGD) WITH ESOPHAGEAL DILATION;  Surgeon: [name], MD;  [facility];  Service: Endoscopy;  Laterality: N/A;  220-rescheduled to 730 Ann to notify pt   [place]     2000, 2005.      Current Meds  Medication Sig   albuterol-ipratropium (COMBIVENT) 18-103 MCG/ACT inhaler Inhale 2 puffs into the lungs every 6 (six)  hours as needed for wheezing or shortness of breath (uses rarely). UAD PRN   allopurinol (ZYLOPRIM) 100 MG tablet Take 100 mg by mouth daily.   Cholecalciferol (VITAMIN D3) 1000 UNITS tablet Take 1,000 Units by mouth daily.     furosemide (LASIX) 20 MG tablet Take 10 mg by mouth daily.    hydroxypropyl methylcellulose / hypromellose (ISOPTO TEARS / GONIOVISC) 2.5 % ophthalmic solution Place 1 drop into both  eyes 3 (three) times daily as needed for dry eyes.    KLOR-CON 10 10 MEQ tablet Take 1 tablet by mouth daily.   metoprolol tartrate (LOPRESSOR) 25 MG tablet Take 1 tablet by mouth 2 (two) times daily.    Multiple Vitamins-Minerals (PRESERVISION AREDS) CAPS Take 1 capsule by mouth 2 (two) times daily.    ondansetron (ZOFRAN) 4 MG tablet Take 1 tablet (4 mg total) by mouth every 8 (eight) hours as needed for nausea or vomiting.   RABEprazole (ACIPHEX) 20 MG tablet Take 20 mg by mouth daily.   traMADol (ULTRAM) 50 MG tablet Take 50 mg by mouth 3 (three) times daily as needed.   VIAGRA 100 MG tablet Take 100 mg by mouth daily as needed for erectile dysfunction.    warfarin (COUMADIN) 5 MG tablet Take 2.5-5 mg by mouth See admin instructions. Take 5 mg on Mondays . Take 2.5 mg all other days.  UAD - managed by [name]     Allergies:   Sulfonamide derivatives   Social History   Tobacco Use   Smoking status: Former Smoker    Packs/day: 1.00    Years: 18.00    Pack years: 18.00    Types: Cigarettes    Start date: 05/10/1947    Quit date: 06/28/1965    Years since quitting: 53.6   Smokeless tobacco: Former Systems developer    Types: Chew    Quit date: 06/29/1967  Substance Use Topics   Alcohol use: No    Alcohol/week: 0.0 standard drinks   Drug use: No     Family Hx: The patient's family history includes Heart attack in his maternal uncle; Hypertension in his father.  ROS:   Please see the history of present illness. All other systems reviewed and are negative.   Prior CV studies:   The following studies were reviewed today:  Echocardiogram 10/31/2017 [facility]): Mild LVH with LVEF 70 to 75%, mild left atrial enlargement, mildly sclerotic aortic valve with mild aortic regurgitation, mild to moderate mitral regurgitation, mild tricuspid regurgitation, RVSP estimated 20 to 30 mmHg, mildly dilated aortic root measuring 39 mm.  Labs/Other Tests and Data Reviewed:     EKG:  An ECG dated 12/30/2017 was personally reviewed today and demonstrated:  Rate controlled atrial fibrillation with low voltage.  Recent Labs:  November 2019: BUN 13, creatinine 0.95, potassium 4.9, AST 22, ALT 8, Hgb12.8, platelets 186, TSH 1.84  Wt Readings from Last 3 Encounters:  01/26/19 222 lb (100.7 kg)  12/06/18 228 lb 12.8 oz (103.8 kg)  07/04/18 229 lb (103.9 kg)     Objective:    Vital Signs:  BP 125/75    Pulse 70    Ht 5\' 9"  (1.753 m)    Wt 222 lb (100.7 kg)    BMI 32.78 kg/m    Patient spoke in full sentences, not short of breath. No audible wheezing or coughing. Normal speech pattern.  ASSESSMENT & PLAN:    1.  Permanent atrial fibrillation.  He has had adequate heart rate control  on Lopressor and continues on Coumadin for stroke prophylaxis with follow-up by PCP.  No changes were made today.  2.  Essential hypertension, blood pressure is well controlled today.  COVID-19 Education: The signs and symptoms of COVID-19 were discussed with the patient and how to seek care for testing (follow up with PCP or arrange E-visit).  The importance of social distancing was discussed today.  Time:   Today, I have spent 6 minutes with the patient with telehealth technology discussing the above problems.     Medication Adjustments/Labs and Tests Ordered: Current medicines are reviewed at length with the patient today.  Concerns regarding medicines are outlined above.   Tests Ordered: No orders of the defined types were placed in this encounter.   Medication Changes: No orders of the defined types were placed in this encounter.   Follow Up:  In Person 6 months in the [place] office.  Signed, [name], MD  01/26/2019 9:45 AM    [place]

## 2019-02-06 DIAGNOSIS — R1312 Dysphagia, oropharyngeal phase: Secondary | ICD-10-CM | POA: Diagnosis not present

## 2019-02-06 DIAGNOSIS — I10 Essential (primary) hypertension: Secondary | ICD-10-CM | POA: Diagnosis not present

## 2019-02-06 DIAGNOSIS — I739 Peripheral vascular disease, unspecified: Secondary | ICD-10-CM | POA: Diagnosis not present

## 2019-02-06 DIAGNOSIS — Z6835 Body mass index (BMI) 35.0-35.9, adult: Secondary | ICD-10-CM | POA: Diagnosis not present

## 2019-02-06 DIAGNOSIS — Z299 Encounter for prophylactic measures, unspecified: Secondary | ICD-10-CM | POA: Diagnosis not present

## 2019-02-06 DIAGNOSIS — J449 Chronic obstructive pulmonary disease, unspecified: Secondary | ICD-10-CM | POA: Diagnosis not present

## 2019-02-06 DIAGNOSIS — I482 Chronic atrial fibrillation, unspecified: Secondary | ICD-10-CM | POA: Diagnosis not present

## 2019-02-23 ENCOUNTER — Encounter: Payer: Self-pay | Admitting: Neurology

## 2019-02-26 ENCOUNTER — Other Ambulatory Visit (INDEPENDENT_AMBULATORY_CARE_PROVIDER_SITE_OTHER): Payer: Medicare Other

## 2019-02-26 ENCOUNTER — Ambulatory Visit (INDEPENDENT_AMBULATORY_CARE_PROVIDER_SITE_OTHER): Payer: Medicare Other | Admitting: Neurology

## 2019-02-26 ENCOUNTER — Other Ambulatory Visit: Payer: Self-pay

## 2019-02-26 ENCOUNTER — Encounter: Payer: Self-pay | Admitting: Neurology

## 2019-02-26 VITALS — BP 142/85 | HR 98 | Ht 69.0 in | Wt 232.0 lb

## 2019-02-26 DIAGNOSIS — R2689 Other abnormalities of gait and mobility: Secondary | ICD-10-CM

## 2019-02-26 DIAGNOSIS — R202 Paresthesia of skin: Secondary | ICD-10-CM

## 2019-02-26 DIAGNOSIS — R634 Abnormal weight loss: Secondary | ICD-10-CM | POA: Diagnosis not present

## 2019-02-26 DIAGNOSIS — G629 Polyneuropathy, unspecified: Secondary | ICD-10-CM | POA: Diagnosis not present

## 2019-02-26 DIAGNOSIS — G959 Disease of spinal cord, unspecified: Secondary | ICD-10-CM

## 2019-02-26 NOTE — Patient Instructions (Addendum)
Check labs  MRI cervical spine wo contrast Start physical therapy for balance  Your provider has requested that you have labwork completed today. Please go to [facility] ([id number]) on the second floor of this building before leaving the office today. You do not need to check in. If you are not called within 15 minutes please check with the front desk.  We have sent a referral to [facility] for your MRI and they will call you directly to schedule your appointment. They are located at [place]. If you need to contact them directly please call [phone].  We will call you with the results and let you know the next step.

## 2019-02-26 NOTE — Progress Notes (Addendum)
[facility] Clinic Note - Initial Visit   Date: 02/26/19  Nicholas [name] MRN: [id number] DOB: Jun 06, 1929   Dear [name]:  Thank you for your kind referral of Nicholas [name] for consultation of bilateral leg weakness. Although his history is well known to you, please allow [place] to reiterate it for the purpose of our medical record. The patient was accompanied to the clinic by self.   History of Present Illness: Nicholas [name] is a 85 y.o. right-handed male with atrial fibrillation on coumadin, peripheral vascular disease, COPD, hypertension, gout, history of prostate cancer, and GERD presenting for evaluation of bilateral leg weakness.   Starting around 2019, he began having sensation of weakness in the lower legs.  This is worse with exertion.  He also complains of imbalance and has been using a cane for the past year.  He has not suffered any falls.  He has mild numbness and tingling of the feet.  He does not have diabetes and does not drink alcohol.  He lives alone and has steps to his basement, he has not had any problems climbing them.  He underwent MRI lumbar spine in May 2020, which showed mild degenerative changes, no significant foraminal or canal stenosis to explain his symptoms.  He also complains of bilateral arm numbness, which is worse after he is been sitting on his recliner.  He has some mild weakness in the hands.  He has mild neck stiffness and discomfort.  He denies any radicular symptoms.  Out-side paper records, electronic medical record, and images have been reviewed where available and summarized as:  MRI lumbar spine wo contrast 11/21/2018: Overall mild degenerative disc disease as described above without central canal or foraminal stenosis. Scattered facet arthropathy appears worst at L4-5 where it is moderate to moderately severe on the right and left.   Past Medical History:  Diagnosis Date  . Atrial fibrillation ([place])   . Cervical  spondylosis without myelopathy   . COPD (chronic obstructive pulmonary disease) ([place])   . Diverticulosis of colon (without mention of hemorrhage)   . Esophageal reflux   . Essential hypertension   . Malignant neoplasm of prostate ([place])   . Obstructive sleep apnea   . Osteoarthritis     Past Surgical History:  Procedure Laterality Date  . CHOLECYSTECTOMY N/A 07/03/2014   Procedure: LAPAROSCOPIC CHOLECYSTECTOMY;  Surgeon: [name], MD;  Location: [facility];  Service: General;  Laterality: N/A;  . COLONOSCOPY    . COLONOSCOPY N/A 12/18/2015   Procedure: COLONOSCOPY;  Surgeon: [name], MD;  Location: [facility];  Service: Endoscopy;  Laterality: N/A;  1200  . ESOPHAGEAL DILATION N/A 08/04/2016   Procedure: ESOPHAGEAL DILATION;  Surgeon: [name], MD;  Location: [facility];  Service: Endoscopy;  Laterality: N/A;  . ESOPHAGOGASTRODUODENOSCOPY N/A 08/04/2016   Procedure: ESOPHAGOGASTRODUODENOSCOPY (EGD);  Surgeon: [name], MD;  Location: [facility];  Service: Endoscopy;  Laterality: N/A;  12:00  . ESOPHAGOGASTRODUODENOSCOPY (EGD) WITH ESOPHAGEAL DILATION N/A 06/18/2013   Procedure: ESOPHAGOGASTRODUODENOSCOPY (EGD) WITH ESOPHAGEAL DILATION;  Surgeon: [name], MD;  Location: [facility];  Service: Endoscopy;  Laterality: N/A;  220-rescheduled to 730 Ann to notify pt  . ROTATOR CUFF REPAIR    . TOTAL KNEE ARTHROPLASTY     2000, 2005.      Medications:  Outpatient Encounter Medications as of 02/26/2019  Medication Sig Note  . albuterol-ipratropium (COMBIVENT) 18-103 MCG/ACT inhaler Inhale 2 puffs  into the lungs every 6 (six) hours as needed for wheezing or shortness of breath (uses rarely). UAD PRN   . allopurinol (ZYLOPRIM) 100 MG tablet Take 100 mg by mouth daily.   . Cholecalciferol (VITAMIN D3) 1000 UNITS tablet Take 1,000 Units by mouth daily.     . furosemide (LASIX) 20 MG tablet Take 10 mg by mouth daily.    [name] KLOR-CON 10 10 MEQ tablet Take 1  tablet by mouth daily.   . metoprolol tartrate (LOPRESSOR) 25 MG tablet Take 1 tablet by mouth 2 (two) times daily.    . Multiple Vitamins-Minerals (PRESERVISION AREDS) CAPS Take 1 capsule by mouth 2 (two) times daily.    . Multiple Vitamins-Minerals (PRESERVISION AREDS) TABS Take by mouth 2 (two) times daily.   . ondansetron (ZOFRAN) 4 MG tablet Take 1 tablet (4 mg total) by mouth every 8 (eight) hours as needed for nausea or vomiting.   . RABEprazole (ACIPHEX) 20 MG tablet Take 20 mg by mouth daily.   . traMADol (ULTRAM) 50 MG tablet Take 50 mg by mouth 3 (three) times daily as needed.   [name] VIAGRA 100 MG tablet Take 100 mg by mouth daily as needed for erectile dysfunction.  07/28/2016: rarely  . warfarin (COUMADIN) 5 MG tablet Take 2.5-5 mg by mouth See admin instructions. Take 5 mg on Mondays . Take 2.5 mg all other days.  UAD - managed by [name] 07/28/2016: On hold 5 days prior to procedure   . [DISCONTINUED] hydroxypropyl methylcellulose / hypromellose (ISOPTO TEARS / GONIOVISC) 2.5 % ophthalmic solution Place 1 drop into both eyes 3 (three) times daily as needed for dry eyes.     No facility-administered encounter medications on file as of 02/26/2019.     Allergies:  Allergies  Allergen Reactions  . Sulfonamide Derivatives Rash    Family History: Family History  Problem Relation Age of Onset  . Hypertension Father   . Heart attack Maternal Uncle     Social History: Social History   Tobacco Use  . Smoking status: Former Smoker    Packs/day: 1.00    Years: 18.00    Pack years: 18.00    Types: Cigarettes    Start date: 05/10/1947    Quit date: 06/28/1965    Years since quitting: 53.7  . Smokeless tobacco: Former Systems developer    Types: Chew    Quit date: 06/29/1967  Substance Use Topics  . Alcohol use: No    Alcohol/week: 0.0 standard drinks  . Drug use: No   Social History   Social History Narrative   One story   Right handed   Lives alone   One son    Review of  Systems:  CONSTITUTIONAL: No fevers, chills, night sweats, or weight loss.   EYES: No visual changes or eye pain ENT: No hearing changes.  No history of nose bleeds.   RESPIRATORY: No cough, wheezing and shortness of breath.   CARDIOVASCULAR: Negative for chest pain, and palpitations.   GI: Negative for abdominal discomfort, blood in stools or black stools.  No recent change in bowel habits.   GU:  No history of incontinence.   MUSCLOSKELETAL: No history of joint pain or swelling.  No myalgias.   SKIN: Negative for lesions, rash, and itching.   HEMATOLOGY/ONCOLOGY: Negative for prolonged bleeding, bruising easily, and swollen nodes.  No history of cancer.   ENDOCRINE: Negative for cold or heat intolerance, polydipsia or goiter.   PSYCH:  No depression or anxiety  symptoms.   NEURO: As Above.   Vital Signs:  BP (!) 142/85   Pulse 98   Ht 5\' 9"  (1.753 m)   Wt 232 lb (105.2 kg)   SpO2 98%   BMI 34.26 kg/m    General Medical Exam:   General:  Well appearing, comfortable.   Eyes/ENT: see cranial nerve examination.   Neck:   No carotid bruits. Respiratory:  Clear to auscultation, good air entry bilaterally.   Cardiac:  Regular rate and rhythm, no murmur.   Extremities:  No deformities, edema, or skin discoloration.  Skin:  No rashes or lesions.  Neurological Exam: MENTAL STATUS including orientation to time, place, person, recent and remote memory, attention span and concentration, language, and fund of knowledge is normal.  Speech is not dysarthric.   CRANIAL NERVES: II:  No visual field defects.     III-IV-VI: Pupils equal round and reactive to light.  Normal conjugate, extra-ocular eye movements in all directions of gaze.  No nystagmus.  No ptosis.   V:  Normal facial sensation.    VII:  Normal facial symmetry and movements.   VIII:  Normal hearing and vestibular function.   IX-X:  Normal palatal movement.   XI:  Normal shoulder shrug and head rotation.   XII:  Normal tongue  strength and range of motion, no deviation or fasciculation.  MOTOR:  No atrophy, fasciculations or abnormal movements.  No pronator drift.   Upper Extremity:  Right  Left  Deltoid  5/5   5/5   Biceps  5/5   5/5   Triceps  5/5   5/5   Infraspinatus 5/5  5/5  Medial pectoralis 5/5  5/5  Wrist extensors  5/5   5/5   Wrist flexors  5/5   5/5   Finger extensors  5/5   5/5   Finger flexors  5/5   5/5   Dorsal interossei  5/5   5/5   Abductor pollicis  5/5   5/5   Tone (Ashworth scale)  0  0   Lower Extremity:  Right  Left  Hip flexors  5/5   5/5   Hip extensors  5/5   5/5   Adductor 5/5  5/5  Abductor 5/5  5/5  Knee flexors  5/5   5/5   Knee extensors  5/5   5/5   Dorsiflexors  5/5   5/5   Plantarflexors  5/5   5/5   Toe extensors  5/5   5/5   Toe flexors  5/5   5/5   Tone (Ashworth scale)  0  0   MSRs:  Right        Left                  brachioradialis 3+  3+  biceps 3+  3+  triceps 3+  3+  patellar 2+  2+  ankle jerk 1+  1+  Hoffman no  no  plantar response up  up  Crossed adductors bilaterally  SENSORY:  Absent vibration distal to ankles bilaterally and reduced mildly at the knees.  Pin prick, temperature, and light touch is intact.   Romberg's sign absent.   COORDINATION/GAIT: Normal finger-to- nose-finger  Intact rapid alternating movements bilaterally. Gait wide-based and stable, assisted with a cane.  IMPRESSION: 1. Probable cervical canal stenosis with myelopathy.  Exam shows very brisk reflexes in the upper extremities as well as crossed abductors and bilateral Babinski in the legs.  Although he  is complaining about leg weakness, I explained that if there is cervical canal stenosis, this could explain his arm paresthesias and leg symptoms.   - MRI cervical spine wo contrast   2. Idiopathic peripheral neuropathy affecting the feet.  This could be contributing to his sensory ataxia  - Check TSH, vitamin B12, vitamin B1, folate, SPEP with IFE, copper  - Start  balance therapy  - Consider EMG going forward.  This will be limited as he is on anticoagulation therapy  Further recommendations pending results.  Thank you for allowing me to participate in patient's care.  If I can answer any additional questions, I would be pleased to do so.    Sincerely,    Donika K. Posey Pronto, DO

## 2019-03-01 DIAGNOSIS — H353132 Nonexudative age-related macular degeneration, bilateral, intermediate dry stage: Secondary | ICD-10-CM | POA: Diagnosis not present

## 2019-03-02 ENCOUNTER — Telehealth: Payer: Self-pay

## 2019-03-02 LAB — EXTRA SPECIMEN

## 2019-03-02 LAB — IMMUNOFIXATION ELECTROPHORESIS
IgG (Immunoglobin G), Serum: 730 mg/dL (ref 600–1540)
IgM, Serum: 22 mg/dL — ABNORMAL LOW (ref 50–300)
Immunoglobulin A: 315 mg/dL (ref 70–320)

## 2019-03-02 LAB — B12 AND FOLATE PANEL
Folate: 15.7 ng/mL
Vitamin B-12: 258 pg/mL (ref 200–1100)

## 2019-03-02 LAB — VITAMIN B1: Vitamin B1 (Thiamine): 20 nmol/L (ref 8–30)

## 2019-03-02 LAB — TSH: TSH: 1.75 mIU/L (ref 0.40–4.50)

## 2019-03-02 LAB — PROTEIN ELECTROPHORESIS, SERUM
Albumin ELP: 3.6 g/dL — ABNORMAL LOW (ref 3.8–4.8)
Alpha 1: 0.3 g/dL (ref 0.2–0.3)
Alpha 2: 0.8 g/dL (ref 0.5–0.9)
Beta 2: 0.4 g/dL (ref 0.2–0.5)
Beta Globulin: 0.4 g/dL (ref 0.4–0.6)
Gamma Globulin: 0.7 g/dL — ABNORMAL LOW (ref 0.8–1.7)
Total Protein: 6.1 g/dL (ref 6.1–8.1)

## 2019-03-02 LAB — COPPER, SERUM: Copper: 89 ug/dL (ref 70–175)

## 2019-03-02 NOTE — Telephone Encounter (Signed)
-----   Message from Alda Berthold, DO sent at 03/02/2019 12:27 PM EDT ----- Please inform patient that labs are all normal, except B12 is borderline low.  Recommend he take vitamin B12 1058mcg daily OTC.  Thanks.

## 2019-03-02 NOTE — Telephone Encounter (Signed)
Left message for patient

## 2019-03-07 DIAGNOSIS — R2689 Other abnormalities of gait and mobility: Secondary | ICD-10-CM | POA: Diagnosis not present

## 2019-03-07 DIAGNOSIS — R531 Weakness: Secondary | ICD-10-CM | POA: Diagnosis not present

## 2019-03-08 DIAGNOSIS — I10 Essential (primary) hypertension: Secondary | ICD-10-CM | POA: Diagnosis not present

## 2019-03-08 DIAGNOSIS — E538 Deficiency of other specified B group vitamins: Secondary | ICD-10-CM | POA: Diagnosis not present

## 2019-03-08 DIAGNOSIS — Z299 Encounter for prophylactic measures, unspecified: Secondary | ICD-10-CM | POA: Diagnosis not present

## 2019-03-08 DIAGNOSIS — Z6836 Body mass index (BMI) 36.0-36.9, adult: Secondary | ICD-10-CM | POA: Diagnosis not present

## 2019-03-08 DIAGNOSIS — I739 Peripheral vascular disease, unspecified: Secondary | ICD-10-CM | POA: Diagnosis not present

## 2019-03-08 DIAGNOSIS — I482 Chronic atrial fibrillation, unspecified: Secondary | ICD-10-CM | POA: Diagnosis not present

## 2019-03-09 DIAGNOSIS — R531 Weakness: Secondary | ICD-10-CM | POA: Diagnosis not present

## 2019-03-09 DIAGNOSIS — R2689 Other abnormalities of gait and mobility: Secondary | ICD-10-CM | POA: Diagnosis not present

## 2019-03-12 DIAGNOSIS — R531 Weakness: Secondary | ICD-10-CM | POA: Diagnosis not present

## 2019-03-12 DIAGNOSIS — R2689 Other abnormalities of gait and mobility: Secondary | ICD-10-CM | POA: Diagnosis not present

## 2019-03-14 DIAGNOSIS — R2689 Other abnormalities of gait and mobility: Secondary | ICD-10-CM | POA: Diagnosis not present

## 2019-03-14 DIAGNOSIS — R531 Weakness: Secondary | ICD-10-CM | POA: Diagnosis not present

## 2019-03-16 DIAGNOSIS — R531 Weakness: Secondary | ICD-10-CM | POA: Diagnosis not present

## 2019-03-16 DIAGNOSIS — R2689 Other abnormalities of gait and mobility: Secondary | ICD-10-CM | POA: Diagnosis not present

## 2019-03-16 DIAGNOSIS — Z23 Encounter for immunization: Secondary | ICD-10-CM | POA: Diagnosis not present

## 2019-03-19 DIAGNOSIS — R531 Weakness: Secondary | ICD-10-CM | POA: Diagnosis not present

## 2019-03-19 DIAGNOSIS — R2689 Other abnormalities of gait and mobility: Secondary | ICD-10-CM | POA: Diagnosis not present

## 2019-03-21 DIAGNOSIS — R531 Weakness: Secondary | ICD-10-CM | POA: Diagnosis not present

## 2019-03-21 DIAGNOSIS — R2689 Other abnormalities of gait and mobility: Secondary | ICD-10-CM | POA: Diagnosis not present

## 2019-03-23 DIAGNOSIS — R2689 Other abnormalities of gait and mobility: Secondary | ICD-10-CM | POA: Diagnosis not present

## 2019-03-23 DIAGNOSIS — R531 Weakness: Secondary | ICD-10-CM | POA: Diagnosis not present

## 2019-03-26 DIAGNOSIS — R2689 Other abnormalities of gait and mobility: Secondary | ICD-10-CM | POA: Diagnosis not present

## 2019-03-26 DIAGNOSIS — R531 Weakness: Secondary | ICD-10-CM | POA: Diagnosis not present

## 2019-03-28 DIAGNOSIS — R531 Weakness: Secondary | ICD-10-CM | POA: Diagnosis not present

## 2019-03-28 DIAGNOSIS — R2689 Other abnormalities of gait and mobility: Secondary | ICD-10-CM | POA: Diagnosis not present

## 2019-03-29 ENCOUNTER — Other Ambulatory Visit: Payer: Self-pay

## 2019-03-29 ENCOUNTER — Ambulatory Visit
Admission: RE | Admit: 2019-03-29 | Discharge: 2019-03-29 | Disposition: A | Discharge status: Home / Self Care | Payer: Medicare Other | Source: Ambulatory Visit | Attending: Neurology | Admitting: Neurology

## 2019-03-29 DIAGNOSIS — G959 Disease of spinal cord, unspecified: Secondary | ICD-10-CM

## 2019-03-29 DIAGNOSIS — R2689 Other abnormalities of gait and mobility: Secondary | ICD-10-CM

## 2019-03-29 DIAGNOSIS — M4802 Spinal stenosis, cervical region: Secondary | ICD-10-CM | POA: Diagnosis not present

## 2019-03-29 DIAGNOSIS — G629 Polyneuropathy, unspecified: Secondary | ICD-10-CM

## 2019-03-30 ENCOUNTER — Telehealth: Payer: Self-pay | Admitting: Neurology

## 2019-03-30 NOTE — Telephone Encounter (Signed)
Patient left a VM stating he was calling about test results and was returning a call

## 2019-04-02 ENCOUNTER — Telehealth: Payer: Self-pay

## 2019-04-02 DIAGNOSIS — R531 Weakness: Secondary | ICD-10-CM | POA: Diagnosis not present

## 2019-04-02 DIAGNOSIS — R2689 Other abnormalities of gait and mobility: Secondary | ICD-10-CM | POA: Diagnosis not present

## 2019-04-02 NOTE — Telephone Encounter (Signed)
Pt notified and faxed referral and information. [id number]

## 2019-04-02 NOTE — Telephone Encounter (Signed)
-----   Message from Alda Berthold, DO sent at 04/02/2019  3:15 PM EDT ----- Please refer patient to physical therapy for cervical radiculopathy and leg weakness.  He will also need a follow-up in 2 months.  Thanks.

## 2019-04-02 NOTE — Telephone Encounter (Signed)
Sent copy to patient of MRI

## 2019-04-03 ENCOUNTER — Telehealth: Payer: Self-pay | Admitting: Neurology

## 2019-04-03 DIAGNOSIS — I5030 Unspecified diastolic (congestive) heart failure: Secondary | ICD-10-CM | POA: Diagnosis not present

## 2019-04-03 DIAGNOSIS — Z6837 Body mass index (BMI) 37.0-37.9, adult: Secondary | ICD-10-CM | POA: Diagnosis not present

## 2019-04-03 DIAGNOSIS — E538 Deficiency of other specified B group vitamins: Secondary | ICD-10-CM | POA: Diagnosis not present

## 2019-04-03 DIAGNOSIS — I10 Essential (primary) hypertension: Secondary | ICD-10-CM | POA: Diagnosis not present

## 2019-04-03 DIAGNOSIS — I482 Chronic atrial fibrillation, unspecified: Secondary | ICD-10-CM | POA: Diagnosis not present

## 2019-04-03 DIAGNOSIS — Z299 Encounter for prophylactic measures, unspecified: Secondary | ICD-10-CM | POA: Diagnosis not present

## 2019-04-03 NOTE — Telephone Encounter (Signed)
Katie from [facility] called and said this patient is already established there and has an appointment tomorrow, 04/04/2019, at 2:00 PM. They will address the needed neck therapy at that time as well. FYI only.

## 2019-04-03 NOTE — Telephone Encounter (Signed)
Noted  

## 2019-04-04 DIAGNOSIS — R531 Weakness: Secondary | ICD-10-CM | POA: Diagnosis not present

## 2019-04-04 DIAGNOSIS — M4722 Other spondylosis with radiculopathy, cervical region: Secondary | ICD-10-CM | POA: Diagnosis not present

## 2019-04-04 DIAGNOSIS — R2689 Other abnormalities of gait and mobility: Secondary | ICD-10-CM | POA: Diagnosis not present

## 2019-04-04 DIAGNOSIS — G629 Polyneuropathy, unspecified: Secondary | ICD-10-CM | POA: Diagnosis not present

## 2019-04-04 NOTE — Telephone Encounter (Signed)
Called patient, per Dr. Posey Pronto, and left a message to call back and schedule: 2 month follow-up for patient in the office.

## 2019-04-05 DIAGNOSIS — M79675 Pain in left toe(s): Secondary | ICD-10-CM | POA: Diagnosis not present

## 2019-04-05 DIAGNOSIS — S90112A Contusion of left great toe without damage to nail, initial encounter: Secondary | ICD-10-CM | POA: Diagnosis not present

## 2019-04-05 DIAGNOSIS — M79672 Pain in left foot: Secondary | ICD-10-CM | POA: Diagnosis not present

## 2019-04-05 DIAGNOSIS — S92425A Nondisplaced fracture of distal phalanx of left great toe, initial encounter for closed fracture: Secondary | ICD-10-CM | POA: Diagnosis not present

## 2019-04-06 DIAGNOSIS — G629 Polyneuropathy, unspecified: Secondary | ICD-10-CM | POA: Diagnosis not present

## 2019-04-06 DIAGNOSIS — R531 Weakness: Secondary | ICD-10-CM | POA: Diagnosis not present

## 2019-04-06 DIAGNOSIS — M4722 Other spondylosis with radiculopathy, cervical region: Secondary | ICD-10-CM | POA: Diagnosis not present

## 2019-04-06 DIAGNOSIS — R2689 Other abnormalities of gait and mobility: Secondary | ICD-10-CM | POA: Diagnosis not present

## 2019-04-11 DIAGNOSIS — R531 Weakness: Secondary | ICD-10-CM | POA: Diagnosis not present

## 2019-04-11 DIAGNOSIS — M4722 Other spondylosis with radiculopathy, cervical region: Secondary | ICD-10-CM | POA: Diagnosis not present

## 2019-04-11 DIAGNOSIS — R2689 Other abnormalities of gait and mobility: Secondary | ICD-10-CM | POA: Diagnosis not present

## 2019-04-11 DIAGNOSIS — G629 Polyneuropathy, unspecified: Secondary | ICD-10-CM | POA: Diagnosis not present

## 2019-04-13 DIAGNOSIS — R2689 Other abnormalities of gait and mobility: Secondary | ICD-10-CM | POA: Diagnosis not present

## 2019-04-13 DIAGNOSIS — G629 Polyneuropathy, unspecified: Secondary | ICD-10-CM | POA: Diagnosis not present

## 2019-04-13 DIAGNOSIS — M4722 Other spondylosis with radiculopathy, cervical region: Secondary | ICD-10-CM | POA: Diagnosis not present

## 2019-04-13 DIAGNOSIS — R531 Weakness: Secondary | ICD-10-CM | POA: Diagnosis not present

## 2019-04-16 DIAGNOSIS — R2689 Other abnormalities of gait and mobility: Secondary | ICD-10-CM | POA: Diagnosis not present

## 2019-04-16 DIAGNOSIS — G629 Polyneuropathy, unspecified: Secondary | ICD-10-CM | POA: Diagnosis not present

## 2019-04-16 DIAGNOSIS — R531 Weakness: Secondary | ICD-10-CM | POA: Diagnosis not present

## 2019-04-16 DIAGNOSIS — M4722 Other spondylosis with radiculopathy, cervical region: Secondary | ICD-10-CM | POA: Diagnosis not present

## 2019-04-18 DIAGNOSIS — M4722 Other spondylosis with radiculopathy, cervical region: Secondary | ICD-10-CM | POA: Diagnosis not present

## 2019-04-18 DIAGNOSIS — G629 Polyneuropathy, unspecified: Secondary | ICD-10-CM | POA: Diagnosis not present

## 2019-04-18 DIAGNOSIS — R2689 Other abnormalities of gait and mobility: Secondary | ICD-10-CM | POA: Diagnosis not present

## 2019-04-18 DIAGNOSIS — R531 Weakness: Secondary | ICD-10-CM | POA: Diagnosis not present

## 2019-04-20 DIAGNOSIS — R531 Weakness: Secondary | ICD-10-CM | POA: Diagnosis not present

## 2019-04-20 DIAGNOSIS — G629 Polyneuropathy, unspecified: Secondary | ICD-10-CM | POA: Diagnosis not present

## 2019-04-20 DIAGNOSIS — M4722 Other spondylosis with radiculopathy, cervical region: Secondary | ICD-10-CM | POA: Diagnosis not present

## 2019-04-20 DIAGNOSIS — R2689 Other abnormalities of gait and mobility: Secondary | ICD-10-CM | POA: Diagnosis not present

## 2019-04-23 DIAGNOSIS — R2689 Other abnormalities of gait and mobility: Secondary | ICD-10-CM | POA: Diagnosis not present

## 2019-04-23 DIAGNOSIS — G629 Polyneuropathy, unspecified: Secondary | ICD-10-CM | POA: Diagnosis not present

## 2019-04-23 DIAGNOSIS — R531 Weakness: Secondary | ICD-10-CM | POA: Diagnosis not present

## 2019-04-23 DIAGNOSIS — M4722 Other spondylosis with radiculopathy, cervical region: Secondary | ICD-10-CM | POA: Diagnosis not present

## 2019-04-25 DIAGNOSIS — G629 Polyneuropathy, unspecified: Secondary | ICD-10-CM | POA: Diagnosis not present

## 2019-04-25 DIAGNOSIS — R2689 Other abnormalities of gait and mobility: Secondary | ICD-10-CM | POA: Diagnosis not present

## 2019-04-25 DIAGNOSIS — R531 Weakness: Secondary | ICD-10-CM | POA: Diagnosis not present

## 2019-04-25 DIAGNOSIS — M4722 Other spondylosis with radiculopathy, cervical region: Secondary | ICD-10-CM | POA: Diagnosis not present

## 2019-04-26 DIAGNOSIS — M79675 Pain in left toe(s): Secondary | ICD-10-CM | POA: Diagnosis not present

## 2019-04-26 DIAGNOSIS — S92425D Nondisplaced fracture of distal phalanx of left great toe, subsequent encounter for fracture with routine healing: Secondary | ICD-10-CM | POA: Diagnosis not present

## 2019-04-26 DIAGNOSIS — S90112D Contusion of left great toe without damage to nail, subsequent encounter: Secondary | ICD-10-CM | POA: Diagnosis not present

## 2019-04-26 DIAGNOSIS — M79672 Pain in left foot: Secondary | ICD-10-CM | POA: Diagnosis not present

## 2019-04-27 DIAGNOSIS — R2689 Other abnormalities of gait and mobility: Secondary | ICD-10-CM | POA: Diagnosis not present

## 2019-04-27 DIAGNOSIS — M4722 Other spondylosis with radiculopathy, cervical region: Secondary | ICD-10-CM | POA: Diagnosis not present

## 2019-04-27 DIAGNOSIS — G629 Polyneuropathy, unspecified: Secondary | ICD-10-CM | POA: Diagnosis not present

## 2019-04-27 DIAGNOSIS — R531 Weakness: Secondary | ICD-10-CM | POA: Diagnosis not present

## 2019-05-03 DIAGNOSIS — Z7189 Other specified counseling: Secondary | ICD-10-CM | POA: Diagnosis not present

## 2019-05-03 DIAGNOSIS — Z0000 Encounter for general adult medical examination without abnormal findings: Secondary | ICD-10-CM | POA: Diagnosis not present

## 2019-05-03 DIAGNOSIS — Z1211 Encounter for screening for malignant neoplasm of colon: Secondary | ICD-10-CM | POA: Diagnosis not present

## 2019-05-03 DIAGNOSIS — R5383 Other fatigue: Secondary | ICD-10-CM | POA: Diagnosis not present

## 2019-05-03 DIAGNOSIS — I482 Chronic atrial fibrillation, unspecified: Secondary | ICD-10-CM | POA: Diagnosis not present

## 2019-05-03 DIAGNOSIS — Z1331 Encounter for screening for depression: Secondary | ICD-10-CM | POA: Diagnosis not present

## 2019-05-03 DIAGNOSIS — Z6834 Body mass index (BMI) 34.0-34.9, adult: Secondary | ICD-10-CM | POA: Diagnosis not present

## 2019-05-03 DIAGNOSIS — Z79899 Other long term (current) drug therapy: Secondary | ICD-10-CM | POA: Diagnosis not present

## 2019-05-03 DIAGNOSIS — Z1339 Encounter for screening examination for other mental health and behavioral disorders: Secondary | ICD-10-CM | POA: Diagnosis not present

## 2019-05-03 DIAGNOSIS — I5030 Unspecified diastolic (congestive) heart failure: Secondary | ICD-10-CM | POA: Diagnosis not present

## 2019-05-03 DIAGNOSIS — F419 Anxiety disorder, unspecified: Secondary | ICD-10-CM | POA: Diagnosis not present

## 2019-05-03 DIAGNOSIS — Z125 Encounter for screening for malignant neoplasm of prostate: Secondary | ICD-10-CM | POA: Diagnosis not present

## 2019-05-03 DIAGNOSIS — Z299 Encounter for prophylactic measures, unspecified: Secondary | ICD-10-CM | POA: Diagnosis not present

## 2019-05-28 ENCOUNTER — Other Ambulatory Visit: Payer: Self-pay

## 2019-05-28 ENCOUNTER — Ambulatory Visit (INDEPENDENT_AMBULATORY_CARE_PROVIDER_SITE_OTHER): Payer: Medicare Other | Admitting: Neurology

## 2019-05-28 ENCOUNTER — Encounter: Payer: Self-pay | Admitting: Neurology

## 2019-05-28 VITALS — BP 120/80 | HR 83 | Ht 69.0 in | Wt 225.0 lb

## 2019-05-28 DIAGNOSIS — G629 Polyneuropathy, unspecified: Secondary | ICD-10-CM

## 2019-05-28 DIAGNOSIS — M62838 Other muscle spasm: Secondary | ICD-10-CM | POA: Diagnosis not present

## 2019-05-28 DIAGNOSIS — R29898 Other symptoms and signs involving the musculoskeletal system: Secondary | ICD-10-CM | POA: Diagnosis not present

## 2019-05-28 MED ORDER — TIZANIDINE HCL 2 MG PO CAPS: 2.0000 mg | ORAL_CAPSULE | Freq: Two times a day (BID) | ORAL | 1 refills | Status: DC | PRN

## 2019-05-28 NOTE — Patient Instructions (Addendum)
Start tizanidine 2mg  at bedtime to see if this helps with your back pain  Continue home exercises  Return to clinic in 6 months

## 2019-05-28 NOTE — Progress Notes (Signed)
Follow-up Visit   Date: 05/28/19   RAAHIL BUB MRN: [id number] DOB: 09-May-1929   Interim History: Nicholas [name] is a 85 y.o. right-handed Caucasian male atrial fibrillation on coumadin, peripheral vascular disease, COPD, hypertension, gout, history of prostate cancer, and GERD returning to the clinic for follow-up of bilateral leg weakness and new complaints of mid-thoracic pain.  The patient was accompanied to the clinic by self.  History of present illness: Starting around 2019, he began having sensation of weakness in the lower legs.  This is worse with exertion.  He also complains of imbalance and has been using a cane for the past year.  He has not suffered any falls.  He has mild numbness and tingling of the feet.  He does not have diabetes and does not drink alcohol.  He lives alone and has steps to his basement, he has not had any problems climbing them.  He underwent MRI lumbar spine in May 2020, which showed mild degenerative changes, no significant foraminal or canal stenosis to explain his symptoms.  He also complains of bilateral arm numbness, which is worse after he is been sitting on his recliner.  He has some mild weakness in the hands.  He has mild neck stiffness and discomfort.  He denies any radicular symptoms.  UPDATE 05/28/2019:  He is here for follow-up visit for neuropathy and leg weakness. No change to his neuropathy which remains restricted to the feet.  He has not suffered any falls.  He has been going to PT which he feels may have helped on some days, but other days, his legs continue to feel weak.  He finds that rest tends to improve his strength and if he takes a break, he is usually able to get back to his activity.  Today, he also complains of muscle spasm involving the right shoulder region on his back.  It has been present for over one year and is very tender to palpation.  He has tried tylenol and ibuprofen without any relief.     Medications:  Current  Outpatient Medications on File Prior to Visit  Medication Sig Dispense Refill  . albuterol-ipratropium (COMBIVENT) 18-103 MCG/ACT inhaler Inhale 2 puffs into the lungs every 6 (six) hours as needed for wheezing or shortness of breath (uses rarely). UAD PRN    . allopurinol (ZYLOPRIM) 100 MG tablet Take 100 mg by mouth daily.    . Cholecalciferol (VITAMIN D3) 1000 UNITS tablet Take 1,000 Units by mouth daily.      . furosemide (LASIX) 20 MG tablet Take 10 mg by mouth daily.   1  . KLOR-CON 10 10 MEQ tablet Take 1 tablet by mouth daily.    . metoprolol tartrate (LOPRESSOR) 25 MG tablet Take 1 tablet by mouth 2 (two) times daily.     . Multiple Vitamins-Minerals (PRESERVISION AREDS) CAPS Take 1 capsule by mouth 2 (two) times daily.     . Multiple Vitamins-Minerals (PRESERVISION AREDS) TABS Take by mouth 2 (two) times daily.    . ondansetron (ZOFRAN) 4 MG tablet Take 1 tablet (4 mg total) by mouth every 8 (eight) hours as needed for nausea or vomiting. 30 tablet 2  . RABEprazole (ACIPHEX) 20 MG tablet Take 20 mg by mouth daily.    . traMADol (ULTRAM) 50 MG tablet Take 50 mg by mouth 3 (three) times daily as needed.    [name] VIAGRA 100 MG tablet Take 100 mg by mouth daily as needed for erectile dysfunction.     [name]  warfarin (COUMADIN) 5 MG tablet Take 2.5-5 mg by mouth See admin instructions. Take 5 mg on Mondays . Take 2.5 mg all other days.  UAD - managed by [name]     No current facility-administered medications on file prior to visit.     Allergies:  Allergies  Allergen Reactions  . Sulfonamide Derivatives Rash    Review of Systems:  CONSTITUTIONAL: No fevers, chills, night sweats, or weight loss.  EYES: No visual changes or eye pain ENT: No hearing changes.  No history of nose bleeds.   RESPIRATORY: No cough, wheezing and shortness of breath.   CARDIOVASCULAR: Negative for chest pain, and palpitations.   GI: Negative for abdominal discomfort, blood in stools or black stools.  No recent  change in bowel habits.   GU:  No history of incontinence.   MUSCLOSKELETAL: +history of joint pain or swelling.  +myalgias.   SKIN: Negative for lesions, rash, and itching.   ENDOCRINE: Negative for cold or heat intolerance, polydipsia or goiter.   PSYCH:  No  depression or anxiety symptoms.   NEURO: As Above.   Vital Signs:  BP 120/80   Pulse 83   Ht 5\' 9"  (1.753 m)   Wt 225 lb (102.1 kg)   SpO2 96%   BMI 33.23 kg/m   General Medical Exam:   General:  Well appearing, comfortable  Eyes/ENT: see cranial nerve examination.   Neck:   No carotid bruits. Respiratory:  Clear to auscultation, good air entry bilaterally.   Cardiac:  Regular rate and rhythm, no murmur.  Ext:  Mild bilateral ankle edema   Neurological Exam: MENTAL STATUS including orientation to time, place, person, recent and remote memory, attention span and concentration, language, and fund of knowledge is normal.  Speech is not dysarthric.  CRANIAL NERVES:  No visual field defects.  Pupils equal round and reactive to light.  Normal conjugate, extra-ocular eye movements in all directions of gaze.  No ptosis  MOTOR:  Motor strength is 5/5 in all extremities.  No atrophy, fasciculations or abnormal movements.  No pronator drift.  Tone is normal.  Localized muscle spasm over the scapular angle on the left.   MSRs:  Reflexes are 2+/4 throughout and 1+/4 at the ankles bilaterally.  SENSORY:  Absent vibration at the ankles bilaterally.  COORDINATION/GAIT:  Gait is wide-based, stable, assisted with cane.  Data: MRI lumbar spine wo contrast 11/21/2018: Overall mild degenerative disc disease as described above without central canal or foraminal stenosis. Scattered facet arthropathy appears worst at L4-5 where it is moderate to moderately severe on the right and left.  MRI cervical spine wo contrast 03/29/2019: 1. Moderate multilevel cervical spondylosis as above. Resultant mild spinal stenosis at C3-4, C5-6, and C6-7. 2.  Multifactorial degenerative changes with resultant multilevel foraminal narrowing. Notable findings include severe right with moderate left C4 foraminal stenosis, moderate bilateral C6 foraminal narrowing, with severe bilateral C7 foraminal stenosis. 3. Ankylosis of the C4 and C5 vertebral bodies, presumably chronic and degenerative.  Labs 02/26/2019:  B12 258, folate 15.7, copper 89, SPEP with IFE no M protein  IMPRESSION/PLAN: 1.  Muscle spasm involving the right mid-thoracic region  - Start tizanidine 2mg  at bedtime  2.  Idiopathic peripheral neuropathy affecting the feet  - Continue home physical therapy  - EMG declined, exam and history is very typical for neuropathy  - Patient educated on daily foot inspection, fall prevention, and safety precautions around the home.  3.  Subjective bilateral leg weakness. Exam  shows normal strength.  No compressive pathology MRI lumbar spine.  MRI cervical spine with multilevel degenerative changes, but nothing to explain leg weakness.  It's possible some of his low endurance may be contributed by generalized deconditioning  - Continue home exercises  - Fall precautions discussed  Return to clinic in 6 months.    Thank you for allowing me to participate in patient's care.  If I can answer any additional questions, I would be pleased to do so.    Sincerely,    Lacy Sofia K. Posey Pronto, DO

## 2019-06-05 ENCOUNTER — Ambulatory Visit (INDEPENDENT_AMBULATORY_CARE_PROVIDER_SITE_OTHER): Payer: Medicare Other | Admitting: Urology

## 2019-06-05 DIAGNOSIS — N5201 Erectile dysfunction due to arterial insufficiency: Secondary | ICD-10-CM

## 2019-06-05 DIAGNOSIS — Z8546 Personal history of malignant neoplasm of prostate: Secondary | ICD-10-CM

## 2019-06-05 DIAGNOSIS — N400 Enlarged prostate without lower urinary tract symptoms: Secondary | ICD-10-CM | POA: Diagnosis not present

## 2019-06-07 DIAGNOSIS — H43813 Vitreous degeneration, bilateral: Secondary | ICD-10-CM | POA: Diagnosis not present

## 2019-06-07 DIAGNOSIS — H43393 Other vitreous opacities, bilateral: Secondary | ICD-10-CM | POA: Diagnosis not present

## 2019-06-07 DIAGNOSIS — H353132 Nonexudative age-related macular degeneration, bilateral, intermediate dry stage: Secondary | ICD-10-CM | POA: Diagnosis not present

## 2019-06-07 DIAGNOSIS — H35033 Hypertensive retinopathy, bilateral: Secondary | ICD-10-CM | POA: Diagnosis not present

## 2019-07-11 DIAGNOSIS — Z6835 Body mass index (BMI) 35.0-35.9, adult: Secondary | ICD-10-CM | POA: Diagnosis not present

## 2019-07-11 DIAGNOSIS — I5030 Unspecified diastolic (congestive) heart failure: Secondary | ICD-10-CM | POA: Diagnosis not present

## 2019-07-11 DIAGNOSIS — I10 Essential (primary) hypertension: Secondary | ICD-10-CM | POA: Diagnosis not present

## 2019-07-11 DIAGNOSIS — Z299 Encounter for prophylactic measures, unspecified: Secondary | ICD-10-CM | POA: Diagnosis not present

## 2019-07-11 DIAGNOSIS — I482 Chronic atrial fibrillation, unspecified: Secondary | ICD-10-CM | POA: Diagnosis not present

## 2019-07-11 DIAGNOSIS — Z87891 Personal history of nicotine dependence: Secondary | ICD-10-CM | POA: Diagnosis not present

## 2019-07-13 DIAGNOSIS — Z23 Encounter for immunization: Secondary | ICD-10-CM | POA: Diagnosis not present

## 2019-07-24 DIAGNOSIS — I739 Peripheral vascular disease, unspecified: Secondary | ICD-10-CM | POA: Diagnosis not present

## 2019-07-30 DIAGNOSIS — H353131 Nonexudative age-related macular degeneration, bilateral, early dry stage: Secondary | ICD-10-CM | POA: Diagnosis not present

## 2019-07-30 DIAGNOSIS — H524 Presbyopia: Secondary | ICD-10-CM | POA: Diagnosis not present

## 2019-07-30 DIAGNOSIS — Z961 Presence of intraocular lens: Secondary | ICD-10-CM | POA: Diagnosis not present

## 2019-07-31 ENCOUNTER — Encounter (INDEPENDENT_AMBULATORY_CARE_PROVIDER_SITE_OTHER): Payer: Self-pay

## 2019-07-31 ENCOUNTER — Encounter: Payer: Self-pay | Admitting: Cardiology

## 2019-07-31 ENCOUNTER — Ambulatory Visit (INDEPENDENT_AMBULATORY_CARE_PROVIDER_SITE_OTHER): Payer: Medicare Other | Admitting: Cardiology

## 2019-07-31 ENCOUNTER — Other Ambulatory Visit: Payer: Self-pay

## 2019-07-31 VITALS — BP 150/74 | HR 69 | Ht 69.0 in | Wt 223.0 lb

## 2019-07-31 DIAGNOSIS — I10 Essential (primary) hypertension: Secondary | ICD-10-CM | POA: Diagnosis not present

## 2019-07-31 DIAGNOSIS — I4821 Permanent atrial fibrillation: Secondary | ICD-10-CM | POA: Diagnosis not present

## 2019-07-31 NOTE — Progress Notes (Signed)
Cardiology Office Note  Date: 07/31/2019   ID: Nicholas [name], DOB 03-14-1929, MRN [id number]  PCP:  [name], MD  Cardiologist:  [name], MD Electrophysiologist:  None   Chief Complaint  Patient presents with  . Cardiac follow-up    History of Present Illness: Nicholas [name] is an 86 y.o. male last assessed via telehealth encounter in July 2020.  He presents for a routine follow-up visit.  He reports no major change in stamina, complains of chronic fatigue but remains functional with ADLs.  He does feel a vague sense of palpitation when he lays back and reads a book with his arms across his chest.  Otherwise no progressive palpitations and he states that his heart rate does not increase much out of the 80s when he exerts himself.  Baseline heart rate is in the 60s today.  I personally reviewed his ECG which shows rate controlled atrial fibrillation as before.  He continues on Coumadin with follow-up by [name].  He does not report any bleeding problems and states that his recent INR was 2.4.  I reviewed the remainder of his medications which are outlined below.  He states that his grandson finished pharmacy school and is now working.  Past Medical History:  Diagnosis Date  . Atrial fibrillation ([place])   . Cervical spondylosis without myelopathy   . COPD (chronic obstructive pulmonary disease) ([place])   . Diverticulosis of colon (without mention of hemorrhage)   . Esophageal reflux   . Essential hypertension   . Malignant neoplasm of prostate ([place])   . Obstructive sleep apnea   . Osteoarthritis     Past Surgical History:  Procedure Laterality Date  . CHOLECYSTECTOMY N/A 07/03/2014   Procedure: LAPAROSCOPIC CHOLECYSTECTOMY;  Surgeon: [name], MD;  Location: [facility];  Service: General;  Laterality: N/A;  . COLONOSCOPY    . COLONOSCOPY N/A 12/18/2015   Procedure: COLONOSCOPY;  Surgeon: [name], MD;  Location: [facility];  Service: Endoscopy;  Laterality:  N/A;  1200  . ESOPHAGEAL DILATION N/A 08/04/2016   Procedure: ESOPHAGEAL DILATION;  Surgeon: [name], MD;  Location[facility];  Service: Endoscopy;  Laterality: N/A;  . ESOPHAGOGASTRODUODENOSCOPY N/A 08/04/2016   Procedure: ESOPHAGOGASTRODUODENOSCOPY (EGD);  Surgeon: [name], MD;  [facility];  Service: Endoscopy;  Laterality: N/A;  12:00  . ESOPHAGOGASTRODUODENOSCOPY (EGD) WITH ESOPHAGEAL DILATION N/A 06/18/2013   Procedure: ESOPHAGOGASTRODUODENOSCOPY (EGD) WITH ESOPHAGEAL DILATION;  Surgeon: [name], MD;  [facility];  Service: Endoscopy;  Laterality: N/A;  220-rescheduled to 730 Ann to notify pt  . ROTATOR CUFF REPAIR    . TOTAL KNEE ARTHROPLASTY     2000, 2005.     Current Outpatient Medications  Medication Sig Dispense Refill  . albuterol-ipratropium (COMBIVENT) 18-103 MCG/ACT inhaler Inhale 2 puffs into the lungs every 6 (six) hours as needed for wheezing or shortness of breath (uses rarely). UAD PRN    . allopurinol (ZYLOPRIM) 100 MG tablet Take 100 mg by mouth daily.    . Cholecalciferol (VITAMIN D3) 1000 UNITS tablet Take 1,000 Units by mouth daily.      . furosemide (LASIX) 20 MG tablet Take 10 mg by mouth daily.   1  . KLOR-CON 10 10 MEQ tablet Take 1 tablet by mouth daily.    . metoprolol tartrate (LOPRESSOR) 25 MG tablet Take 1 tablet by mouth 2 (two) times daily.     . Multiple Vitamins-Minerals (PRESERVISION  AREDS) CAPS Take 1 capsule by mouth 2 (two) times daily.     . Multiple Vitamins-Minerals (PRESERVISION AREDS) TABS Take by mouth 2 (two) times daily.    . ondansetron (ZOFRAN) 4 MG tablet Take 1 tablet (4 mg total) by mouth every 8 (eight) hours as needed for nausea or vomiting. 30 tablet 2  . RABEprazole (ACIPHEX) 20 MG tablet Take 20 mg by mouth daily.    . tizanidine (ZANAFLEX) 2 MG capsule Take 1 capsule (2 mg total) by mouth 3 times/day as needed-between meals & bedtime for muscle spasms. 30 capsule 1  . traMADol  (ULTRAM) 50 MG tablet Take 50 mg by mouth 3 (three) times daily as needed.    [name] VIAGRA 100 MG tablet Take 100 mg by mouth daily as needed for erectile dysfunction.     [name] warfarin (COUMADIN) 5 MG tablet Take 2.5-5 mg by mouth See admin instructions. Take 5 mg on Mondays . Take 2.5 mg all other days.  UAD - managed by [name]     No current facility-administered medications for this visit.   Allergies:  Sulfonamide derivatives   Social History: The patient  reports that he quit smoking about 54 years ago. His smoking use included cigarettes. He started smoking about 72 years ago. He has a 18.00 pack-year smoking history. He quit smokeless tobacco use about 52 years ago.  His smokeless tobacco use included chew. He reports that he does not drink alcohol or use drugs.   ROS:  Please see the history of present illness. Otherwise, complete review of systems is positive for occasional muscle spasms in his back.  All other systems are reviewed and negative.   Physical Exam: VS:  BP (!) 150/74   Pulse 69   Ht 5\' 9"  (1.753 m)   Wt 223 lb (101.2 kg)   SpO2 96%   BMI 32.93 kg/m , BMI Body mass index is 32.93 kg/m.  Wt Readings from Last 3 Encounters:  07/31/19 223 lb (101.2 kg)  05/28/19 225 lb (102.1 kg)  02/26/19 232 lb (105.2 kg)    General: Elderly male, appears comfortable at rest. HEENT: Conjunctiva and lids normal, wearing a mask. Neck: Supple, no elevated JVP or carotid bruits, no thyromegaly. Lungs: No wheezing, nonlabored breathing at rest. Cardiac: Irregularly irregular, no S3 or significant systolic murmur, no pericardial rub. Abdomen: Soft, nontender, bowel sounds present. Extremities: No pitting edema, distal pulses 2+. Skin: Warm and dry. Musculoskeletal: No kyphosis. Neuropsychiatric: Alert and oriented x3, affect grossly appropriate.  ECG:  An ECG dated 12/30/2017 was personally reviewed today and demonstrated:  Rate controlled atrial fibrillation with low  voltage.  Recent Labwork: 02/26/2019: TSH 1.75   Other Studies Reviewed Today:  Echocardiogram 10/31/2017 [facility]): Mild LVH with LVEF 70 to 75%, mild left atrial enlargement, mildly sclerotic aortic valve with mild aortic regurgitation, mild to moderate mitral regurgitation, mild tricuspid regurgitation, RVSP estimated 20 to 30 mmHg, mildly dilated aortic root measuring 39 mm.  Assessment and Plan:  1.  Permanent atrial fibrillation.  CHA2DS2-VASc or is 3.  He continues on Coumadin for stroke prophylaxis and follows with [name].  No change recommended in current beta-blocker dose with adequate heart rate control noted.  ECG reviewed.  2.  Essential hypertension, systolic is in the [id number] today, better controlled at last visit.  Continue to follow with [name].  Medication Adjustments/Labs and Tests Ordered: Current medicines are reviewed at length with the patient today.  Concerns regarding medicines  are outlined above.   Tests Ordered: Orders Placed This Encounter  Procedures  . EKG 12-Lead    Medication Changes: No orders of the defined types were placed in this encounter.   Disposition:  Follow up 6 months in the [place] office.  Signed, Satira Sark, MD, [facility] 07/31/2019 2:49 PM    [facility] at [place], [place], [place] [address] Phone: [phone]; Fax: [phone]

## 2019-07-31 NOTE — Patient Instructions (Addendum)

## 2019-08-07 DIAGNOSIS — M19012 Primary osteoarthritis, left shoulder: Secondary | ICD-10-CM | POA: Diagnosis not present

## 2019-08-07 DIAGNOSIS — M19011 Primary osteoarthritis, right shoulder: Secondary | ICD-10-CM | POA: Diagnosis not present

## 2019-08-08 DIAGNOSIS — I10 Essential (primary) hypertension: Secondary | ICD-10-CM | POA: Diagnosis not present

## 2019-08-08 DIAGNOSIS — Z6834 Body mass index (BMI) 34.0-34.9, adult: Secondary | ICD-10-CM | POA: Diagnosis not present

## 2019-08-08 DIAGNOSIS — J449 Chronic obstructive pulmonary disease, unspecified: Secondary | ICD-10-CM | POA: Diagnosis not present

## 2019-08-08 DIAGNOSIS — Z299 Encounter for prophylactic measures, unspecified: Secondary | ICD-10-CM | POA: Diagnosis not present

## 2019-08-08 DIAGNOSIS — M1990 Unspecified osteoarthritis, unspecified site: Secondary | ICD-10-CM | POA: Diagnosis not present

## 2019-08-08 DIAGNOSIS — I482 Chronic atrial fibrillation, unspecified: Secondary | ICD-10-CM | POA: Diagnosis not present

## 2019-08-10 DIAGNOSIS — Z23 Encounter for immunization: Secondary | ICD-10-CM | POA: Diagnosis not present

## 2019-09-06 DIAGNOSIS — I5030 Unspecified diastolic (congestive) heart failure: Secondary | ICD-10-CM | POA: Diagnosis not present

## 2019-09-06 DIAGNOSIS — I10 Essential (primary) hypertension: Secondary | ICD-10-CM | POA: Diagnosis not present

## 2019-09-06 DIAGNOSIS — F339 Major depressive disorder, recurrent, unspecified: Secondary | ICD-10-CM | POA: Diagnosis not present

## 2019-09-06 DIAGNOSIS — Z299 Encounter for prophylactic measures, unspecified: Secondary | ICD-10-CM | POA: Diagnosis not present

## 2019-09-06 DIAGNOSIS — I482 Chronic atrial fibrillation, unspecified: Secondary | ICD-10-CM | POA: Diagnosis not present

## 2019-09-06 DIAGNOSIS — Z6834 Body mass index (BMI) 34.0-34.9, adult: Secondary | ICD-10-CM | POA: Diagnosis not present

## 2019-09-11 DIAGNOSIS — M19012 Primary osteoarthritis, left shoulder: Secondary | ICD-10-CM | POA: Diagnosis not present

## 2019-09-11 DIAGNOSIS — M19011 Primary osteoarthritis, right shoulder: Secondary | ICD-10-CM | POA: Diagnosis not present

## 2019-09-12 DIAGNOSIS — M25611 Stiffness of right shoulder, not elsewhere classified: Secondary | ICD-10-CM | POA: Diagnosis not present

## 2019-09-12 DIAGNOSIS — M19011 Primary osteoarthritis, right shoulder: Secondary | ICD-10-CM | POA: Diagnosis not present

## 2019-09-12 DIAGNOSIS — M6281 Muscle weakness (generalized): Secondary | ICD-10-CM | POA: Diagnosis not present

## 2019-09-12 DIAGNOSIS — M25612 Stiffness of left shoulder, not elsewhere classified: Secondary | ICD-10-CM | POA: Diagnosis not present

## 2019-09-12 DIAGNOSIS — M19012 Primary osteoarthritis, left shoulder: Secondary | ICD-10-CM | POA: Diagnosis not present

## 2019-09-17 DIAGNOSIS — M19012 Primary osteoarthritis, left shoulder: Secondary | ICD-10-CM | POA: Diagnosis not present

## 2019-09-17 DIAGNOSIS — M6281 Muscle weakness (generalized): Secondary | ICD-10-CM | POA: Diagnosis not present

## 2019-09-17 DIAGNOSIS — M25611 Stiffness of right shoulder, not elsewhere classified: Secondary | ICD-10-CM | POA: Diagnosis not present

## 2019-09-17 DIAGNOSIS — M25612 Stiffness of left shoulder, not elsewhere classified: Secondary | ICD-10-CM | POA: Diagnosis not present

## 2019-09-17 DIAGNOSIS — M19011 Primary osteoarthritis, right shoulder: Secondary | ICD-10-CM | POA: Diagnosis not present

## 2019-09-19 DIAGNOSIS — M25611 Stiffness of right shoulder, not elsewhere classified: Secondary | ICD-10-CM | POA: Diagnosis not present

## 2019-09-19 DIAGNOSIS — M19012 Primary osteoarthritis, left shoulder: Secondary | ICD-10-CM | POA: Diagnosis not present

## 2019-09-19 DIAGNOSIS — M6281 Muscle weakness (generalized): Secondary | ICD-10-CM | POA: Diagnosis not present

## 2019-09-19 DIAGNOSIS — M25612 Stiffness of left shoulder, not elsewhere classified: Secondary | ICD-10-CM | POA: Diagnosis not present

## 2019-09-19 DIAGNOSIS — M19011 Primary osteoarthritis, right shoulder: Secondary | ICD-10-CM | POA: Diagnosis not present

## 2019-09-24 DIAGNOSIS — M25611 Stiffness of right shoulder, not elsewhere classified: Secondary | ICD-10-CM | POA: Diagnosis not present

## 2019-09-24 DIAGNOSIS — M19012 Primary osteoarthritis, left shoulder: Secondary | ICD-10-CM | POA: Diagnosis not present

## 2019-09-24 DIAGNOSIS — M6281 Muscle weakness (generalized): Secondary | ICD-10-CM | POA: Diagnosis not present

## 2019-09-24 DIAGNOSIS — M19011 Primary osteoarthritis, right shoulder: Secondary | ICD-10-CM | POA: Diagnosis not present

## 2019-09-24 DIAGNOSIS — M25612 Stiffness of left shoulder, not elsewhere classified: Secondary | ICD-10-CM | POA: Diagnosis not present

## 2019-09-26 DIAGNOSIS — M6281 Muscle weakness (generalized): Secondary | ICD-10-CM | POA: Diagnosis not present

## 2019-09-26 DIAGNOSIS — M19012 Primary osteoarthritis, left shoulder: Secondary | ICD-10-CM | POA: Diagnosis not present

## 2019-09-26 DIAGNOSIS — M25611 Stiffness of right shoulder, not elsewhere classified: Secondary | ICD-10-CM | POA: Diagnosis not present

## 2019-09-26 DIAGNOSIS — M19011 Primary osteoarthritis, right shoulder: Secondary | ICD-10-CM | POA: Diagnosis not present

## 2019-09-26 DIAGNOSIS — M25612 Stiffness of left shoulder, not elsewhere classified: Secondary | ICD-10-CM | POA: Diagnosis not present

## 2019-10-02 DIAGNOSIS — M25611 Stiffness of right shoulder, not elsewhere classified: Secondary | ICD-10-CM | POA: Diagnosis not present

## 2019-10-02 DIAGNOSIS — M6281 Muscle weakness (generalized): Secondary | ICD-10-CM | POA: Diagnosis not present

## 2019-10-02 DIAGNOSIS — M19011 Primary osteoarthritis, right shoulder: Secondary | ICD-10-CM | POA: Diagnosis not present

## 2019-10-02 DIAGNOSIS — M25612 Stiffness of left shoulder, not elsewhere classified: Secondary | ICD-10-CM | POA: Diagnosis not present

## 2019-10-02 DIAGNOSIS — M19012 Primary osteoarthritis, left shoulder: Secondary | ICD-10-CM | POA: Diagnosis not present

## 2019-10-09 DIAGNOSIS — I739 Peripheral vascular disease, unspecified: Secondary | ICD-10-CM | POA: Diagnosis not present

## 2019-10-09 DIAGNOSIS — M6281 Muscle weakness (generalized): Secondary | ICD-10-CM | POA: Diagnosis not present

## 2019-10-09 DIAGNOSIS — M19011 Primary osteoarthritis, right shoulder: Secondary | ICD-10-CM | POA: Diagnosis not present

## 2019-10-09 DIAGNOSIS — M25611 Stiffness of right shoulder, not elsewhere classified: Secondary | ICD-10-CM | POA: Diagnosis not present

## 2019-10-09 DIAGNOSIS — M19012 Primary osteoarthritis, left shoulder: Secondary | ICD-10-CM | POA: Diagnosis not present

## 2019-10-09 DIAGNOSIS — M25612 Stiffness of left shoulder, not elsewhere classified: Secondary | ICD-10-CM | POA: Diagnosis not present

## 2019-10-11 DIAGNOSIS — G4700 Insomnia, unspecified: Secondary | ICD-10-CM | POA: Diagnosis not present

## 2019-10-11 DIAGNOSIS — M19011 Primary osteoarthritis, right shoulder: Secondary | ICD-10-CM | POA: Diagnosis not present

## 2019-10-11 DIAGNOSIS — F339 Major depressive disorder, recurrent, unspecified: Secondary | ICD-10-CM | POA: Diagnosis not present

## 2019-10-11 DIAGNOSIS — M19012 Primary osteoarthritis, left shoulder: Secondary | ICD-10-CM | POA: Diagnosis not present

## 2019-10-11 DIAGNOSIS — I5030 Unspecified diastolic (congestive) heart failure: Secondary | ICD-10-CM | POA: Diagnosis not present

## 2019-10-11 DIAGNOSIS — I10 Essential (primary) hypertension: Secondary | ICD-10-CM | POA: Diagnosis not present

## 2019-10-11 DIAGNOSIS — M6281 Muscle weakness (generalized): Secondary | ICD-10-CM | POA: Diagnosis not present

## 2019-10-11 DIAGNOSIS — M25612 Stiffness of left shoulder, not elsewhere classified: Secondary | ICD-10-CM | POA: Diagnosis not present

## 2019-10-11 DIAGNOSIS — M25611 Stiffness of right shoulder, not elsewhere classified: Secondary | ICD-10-CM | POA: Diagnosis not present

## 2019-10-11 DIAGNOSIS — I482 Chronic atrial fibrillation, unspecified: Secondary | ICD-10-CM | POA: Diagnosis not present

## 2019-10-11 DIAGNOSIS — Z299 Encounter for prophylactic measures, unspecified: Secondary | ICD-10-CM | POA: Diagnosis not present

## 2019-10-17 DIAGNOSIS — M19012 Primary osteoarthritis, left shoulder: Secondary | ICD-10-CM | POA: Diagnosis not present

## 2019-10-17 DIAGNOSIS — M25611 Stiffness of right shoulder, not elsewhere classified: Secondary | ICD-10-CM | POA: Diagnosis not present

## 2019-10-17 DIAGNOSIS — M25612 Stiffness of left shoulder, not elsewhere classified: Secondary | ICD-10-CM | POA: Diagnosis not present

## 2019-10-17 DIAGNOSIS — M19011 Primary osteoarthritis, right shoulder: Secondary | ICD-10-CM | POA: Diagnosis not present

## 2019-10-17 DIAGNOSIS — M6281 Muscle weakness (generalized): Secondary | ICD-10-CM | POA: Diagnosis not present

## 2019-10-18 DIAGNOSIS — M19012 Primary osteoarthritis, left shoulder: Secondary | ICD-10-CM | POA: Diagnosis not present

## 2019-10-18 DIAGNOSIS — M25611 Stiffness of right shoulder, not elsewhere classified: Secondary | ICD-10-CM | POA: Diagnosis not present

## 2019-10-18 DIAGNOSIS — M19011 Primary osteoarthritis, right shoulder: Secondary | ICD-10-CM | POA: Diagnosis not present

## 2019-10-18 DIAGNOSIS — M25612 Stiffness of left shoulder, not elsewhere classified: Secondary | ICD-10-CM | POA: Diagnosis not present

## 2019-10-18 DIAGNOSIS — M6281 Muscle weakness (generalized): Secondary | ICD-10-CM | POA: Diagnosis not present

## 2019-10-23 DIAGNOSIS — M19011 Primary osteoarthritis, right shoulder: Secondary | ICD-10-CM | POA: Diagnosis not present

## 2019-10-23 DIAGNOSIS — M6281 Muscle weakness (generalized): Secondary | ICD-10-CM | POA: Diagnosis not present

## 2019-10-23 DIAGNOSIS — M25611 Stiffness of right shoulder, not elsewhere classified: Secondary | ICD-10-CM | POA: Diagnosis not present

## 2019-10-23 DIAGNOSIS — M25612 Stiffness of left shoulder, not elsewhere classified: Secondary | ICD-10-CM | POA: Diagnosis not present

## 2019-10-23 DIAGNOSIS — M19012 Primary osteoarthritis, left shoulder: Secondary | ICD-10-CM | POA: Diagnosis not present

## 2019-10-25 DIAGNOSIS — R531 Weakness: Secondary | ICD-10-CM | POA: Diagnosis not present

## 2019-10-25 DIAGNOSIS — I10 Essential (primary) hypertension: Secondary | ICD-10-CM | POA: Diagnosis not present

## 2019-10-25 DIAGNOSIS — I482 Chronic atrial fibrillation, unspecified: Secondary | ICD-10-CM | POA: Diagnosis not present

## 2019-10-25 DIAGNOSIS — Z299 Encounter for prophylactic measures, unspecified: Secondary | ICD-10-CM | POA: Diagnosis not present

## 2019-10-25 DIAGNOSIS — F339 Major depressive disorder, recurrent, unspecified: Secondary | ICD-10-CM | POA: Diagnosis not present

## 2019-10-29 DIAGNOSIS — H353132 Nonexudative age-related macular degeneration, bilateral, intermediate dry stage: Secondary | ICD-10-CM | POA: Diagnosis not present

## 2019-10-30 DIAGNOSIS — M25611 Stiffness of right shoulder, not elsewhere classified: Secondary | ICD-10-CM | POA: Diagnosis not present

## 2019-10-30 DIAGNOSIS — M19012 Primary osteoarthritis, left shoulder: Secondary | ICD-10-CM | POA: Diagnosis not present

## 2019-10-30 DIAGNOSIS — M19011 Primary osteoarthritis, right shoulder: Secondary | ICD-10-CM | POA: Diagnosis not present

## 2019-10-30 DIAGNOSIS — M6281 Muscle weakness (generalized): Secondary | ICD-10-CM | POA: Diagnosis not present

## 2019-10-30 DIAGNOSIS — M25612 Stiffness of left shoulder, not elsewhere classified: Secondary | ICD-10-CM | POA: Diagnosis not present

## 2019-10-31 DIAGNOSIS — M6281 Muscle weakness (generalized): Secondary | ICD-10-CM | POA: Diagnosis not present

## 2019-10-31 DIAGNOSIS — M19011 Primary osteoarthritis, right shoulder: Secondary | ICD-10-CM | POA: Diagnosis not present

## 2019-10-31 DIAGNOSIS — M25612 Stiffness of left shoulder, not elsewhere classified: Secondary | ICD-10-CM | POA: Diagnosis not present

## 2019-10-31 DIAGNOSIS — M19012 Primary osteoarthritis, left shoulder: Secondary | ICD-10-CM | POA: Diagnosis not present

## 2019-10-31 DIAGNOSIS — M25611 Stiffness of right shoulder, not elsewhere classified: Secondary | ICD-10-CM | POA: Diagnosis not present

## 2019-11-02 DIAGNOSIS — M25612 Stiffness of left shoulder, not elsewhere classified: Secondary | ICD-10-CM | POA: Diagnosis not present

## 2019-11-02 DIAGNOSIS — M19012 Primary osteoarthritis, left shoulder: Secondary | ICD-10-CM | POA: Diagnosis not present

## 2019-11-02 DIAGNOSIS — M6281 Muscle weakness (generalized): Secondary | ICD-10-CM | POA: Diagnosis not present

## 2019-11-02 DIAGNOSIS — M19011 Primary osteoarthritis, right shoulder: Secondary | ICD-10-CM | POA: Diagnosis not present

## 2019-11-02 DIAGNOSIS — M25611 Stiffness of right shoulder, not elsewhere classified: Secondary | ICD-10-CM | POA: Diagnosis not present

## 2019-11-05 DIAGNOSIS — M25612 Stiffness of left shoulder, not elsewhere classified: Secondary | ICD-10-CM | POA: Diagnosis not present

## 2019-11-05 DIAGNOSIS — M19011 Primary osteoarthritis, right shoulder: Secondary | ICD-10-CM | POA: Diagnosis not present

## 2019-11-05 DIAGNOSIS — M6281 Muscle weakness (generalized): Secondary | ICD-10-CM | POA: Diagnosis not present

## 2019-11-05 DIAGNOSIS — M19012 Primary osteoarthritis, left shoulder: Secondary | ICD-10-CM | POA: Diagnosis not present

## 2019-11-05 DIAGNOSIS — M25611 Stiffness of right shoulder, not elsewhere classified: Secondary | ICD-10-CM | POA: Diagnosis not present

## 2019-11-07 DIAGNOSIS — M25611 Stiffness of right shoulder, not elsewhere classified: Secondary | ICD-10-CM | POA: Diagnosis not present

## 2019-11-07 DIAGNOSIS — M19011 Primary osteoarthritis, right shoulder: Secondary | ICD-10-CM | POA: Diagnosis not present

## 2019-11-07 DIAGNOSIS — M25612 Stiffness of left shoulder, not elsewhere classified: Secondary | ICD-10-CM | POA: Diagnosis not present

## 2019-11-07 DIAGNOSIS — M6281 Muscle weakness (generalized): Secondary | ICD-10-CM | POA: Diagnosis not present

## 2019-11-07 DIAGNOSIS — M19012 Primary osteoarthritis, left shoulder: Secondary | ICD-10-CM | POA: Diagnosis not present

## 2019-11-12 DIAGNOSIS — I10 Essential (primary) hypertension: Secondary | ICD-10-CM | POA: Diagnosis not present

## 2019-11-12 DIAGNOSIS — I482 Chronic atrial fibrillation, unspecified: Secondary | ICD-10-CM | POA: Diagnosis not present

## 2019-11-12 DIAGNOSIS — Z299 Encounter for prophylactic measures, unspecified: Secondary | ICD-10-CM | POA: Diagnosis not present

## 2019-11-12 DIAGNOSIS — I5030 Unspecified diastolic (congestive) heart failure: Secondary | ICD-10-CM | POA: Diagnosis not present

## 2019-11-13 DIAGNOSIS — M6281 Muscle weakness (generalized): Secondary | ICD-10-CM | POA: Diagnosis not present

## 2019-11-13 DIAGNOSIS — M25612 Stiffness of left shoulder, not elsewhere classified: Secondary | ICD-10-CM | POA: Diagnosis not present

## 2019-11-13 DIAGNOSIS — M19011 Primary osteoarthritis, right shoulder: Secondary | ICD-10-CM | POA: Diagnosis not present

## 2019-11-13 DIAGNOSIS — M19012 Primary osteoarthritis, left shoulder: Secondary | ICD-10-CM | POA: Diagnosis not present

## 2019-11-13 DIAGNOSIS — M25611 Stiffness of right shoulder, not elsewhere classified: Secondary | ICD-10-CM | POA: Diagnosis not present

## 2019-12-13 DIAGNOSIS — R531 Weakness: Secondary | ICD-10-CM | POA: Diagnosis not present

## 2020-01-01 DIAGNOSIS — D485 Neoplasm of uncertain behavior of skin: Secondary | ICD-10-CM | POA: Diagnosis not present

## 2020-01-01 DIAGNOSIS — L570 Actinic keratosis: Secondary | ICD-10-CM | POA: Diagnosis not present

## 2020-01-14 DIAGNOSIS — I5030 Unspecified diastolic (congestive) heart failure: Secondary | ICD-10-CM | POA: Diagnosis not present

## 2020-01-14 DIAGNOSIS — J449 Chronic obstructive pulmonary disease, unspecified: Secondary | ICD-10-CM | POA: Diagnosis not present

## 2020-01-14 DIAGNOSIS — F339 Major depressive disorder, recurrent, unspecified: Secondary | ICD-10-CM | POA: Diagnosis not present

## 2020-01-14 DIAGNOSIS — Z299 Encounter for prophylactic measures, unspecified: Secondary | ICD-10-CM | POA: Diagnosis not present

## 2020-01-14 DIAGNOSIS — I482 Chronic atrial fibrillation, unspecified: Secondary | ICD-10-CM | POA: Diagnosis not present

## 2020-01-14 DIAGNOSIS — I10 Essential (primary) hypertension: Secondary | ICD-10-CM | POA: Diagnosis not present

## 2020-01-14 DIAGNOSIS — Z6834 Body mass index (BMI) 34.0-34.9, adult: Secondary | ICD-10-CM | POA: Diagnosis not present

## 2020-01-24 DIAGNOSIS — L988 Other specified disorders of the skin and subcutaneous tissue: Secondary | ICD-10-CM | POA: Diagnosis not present

## 2020-01-24 DIAGNOSIS — C44229 Squamous cell carcinoma of skin of left ear and external auricular canal: Secondary | ICD-10-CM | POA: Diagnosis not present

## 2020-02-11 DIAGNOSIS — I5030 Unspecified diastolic (congestive) heart failure: Secondary | ICD-10-CM | POA: Diagnosis not present

## 2020-02-11 DIAGNOSIS — Z6834 Body mass index (BMI) 34.0-34.9, adult: Secondary | ICD-10-CM | POA: Diagnosis not present

## 2020-02-11 DIAGNOSIS — Z299 Encounter for prophylactic measures, unspecified: Secondary | ICD-10-CM | POA: Diagnosis not present

## 2020-02-11 DIAGNOSIS — I482 Chronic atrial fibrillation, unspecified: Secondary | ICD-10-CM | POA: Diagnosis not present

## 2020-02-11 DIAGNOSIS — I10 Essential (primary) hypertension: Secondary | ICD-10-CM | POA: Diagnosis not present

## 2020-02-11 DIAGNOSIS — J449 Chronic obstructive pulmonary disease, unspecified: Secondary | ICD-10-CM | POA: Diagnosis not present

## 2020-02-11 DIAGNOSIS — F339 Major depressive disorder, recurrent, unspecified: Secondary | ICD-10-CM | POA: Diagnosis not present

## 2020-02-14 DIAGNOSIS — C44311 Basal cell carcinoma of skin of nose: Secondary | ICD-10-CM | POA: Diagnosis not present

## 2020-02-14 DIAGNOSIS — C44529 Squamous cell carcinoma of skin of other part of trunk: Secondary | ICD-10-CM | POA: Diagnosis not present

## 2020-02-18 ENCOUNTER — Ambulatory Visit (INDEPENDENT_AMBULATORY_CARE_PROVIDER_SITE_OTHER): Payer: Medicare Other | Admitting: Cardiology

## 2020-02-18 ENCOUNTER — Encounter: Payer: Self-pay | Admitting: Cardiology

## 2020-02-18 ENCOUNTER — Other Ambulatory Visit: Payer: Self-pay

## 2020-02-18 VITALS — BP 136/70 | HR 70 | Ht 69.0 in | Wt 226.0 lb

## 2020-02-18 DIAGNOSIS — I4821 Permanent atrial fibrillation: Secondary | ICD-10-CM | POA: Diagnosis not present

## 2020-02-18 DIAGNOSIS — I10 Essential (primary) hypertension: Secondary | ICD-10-CM | POA: Diagnosis not present

## 2020-02-18 NOTE — Patient Instructions (Addendum)

## 2020-02-18 NOTE — Progress Notes (Signed)
Cardiology Office Note  Date: 02/18/2020   ID: Nicholas [name], DOB Sep 16, 1928, MRN [id number]  PCP:  Nicholas Blitz, MD  Cardiologist:  Nicholas Lesches, MD Electrophysiologist:  None   Chief Complaint  Patient presents with  . Cardiac follow-up    History of Present Illness: Nicholas [name] is an 86 y.o. male last seen in February.  He presents for a routine visit.  He does not report any palpitations.  States that he tries to walk regularly, either goes out in his driveway in the mornings or walks at [employer].  No change in chronic fatigue, he has had no dizziness or syncope.  He is on Coumadin with follow-up by Dr. Manuella [name].  Reports recent INR 2.6, no spontaneous bleeding problems.  He remains on Lopressor for heart rate control.  I reviewed his recent lab work from PCP and the [facility] system.  Past Medical History:  Diagnosis Date  . Atrial fibrillation ([place])   . Cervical spondylosis without myelopathy   . COPD (chronic obstructive pulmonary disease) ([place])   . Diverticulosis of colon (without mention of hemorrhage)   . Esophageal reflux   . Essential hypertension   . Malignant neoplasm of prostate ([place])   . Obstructive sleep apnea   . Osteoarthritis     Past Surgical History:  Procedure Laterality Date  . CHOLECYSTECTOMY N/A 07/03/2014   Procedure: LAPAROSCOPIC CHOLECYSTECTOMY;  Surgeon: Nicholas So, MD;  Location: [facility];  Service: General;  Laterality: N/A;  . COLONOSCOPY    . COLONOSCOPY N/A 12/18/2015   Procedure: COLONOSCOPY;  Surgeon: Nicholas Houston, MD;  [facility];  Service: Endoscopy;  Laterality: N/A;  1200  . ESOPHAGEAL DILATION N/A 08/04/2016   Procedure: ESOPHAGEAL DILATION;  Surgeon: Nicholas Houston, MD;  [facility];  Service: Endoscopy;  Laterality: N/A;  . ESOPHAGOGASTRODUODENOSCOPY N/A 08/04/2016   Procedure: ESOPHAGOGASTRODUODENOSCOPY (EGD);  Surgeon: Nicholas Houston, MD;  [facility];  Service: Endoscopy;   Laterality: N/A;  12:00  . ESOPHAGOGASTRODUODENOSCOPY (EGD) WITH ESOPHAGEAL DILATION N/A 06/18/2013   Procedure: ESOPHAGOGASTRODUODENOSCOPY (EGD) WITH ESOPHAGEAL DILATION;  Surgeon: Nicholas Houston, MD;  [facility];  Service: Endoscopy;  Laterality: N/A;  220-rescheduled to 730 Ann to notify pt  . ROTATOR CUFF REPAIR    . TOTAL KNEE ARTHROPLASTY     2000, 2005.     Current Outpatient Medications  Medication Sig Dispense Refill  . albuterol-ipratropium (COMBIVENT) 18-103 MCG/ACT inhaler Inhale 2 puffs into the lungs every 6 (six) hours as needed for wheezing or shortness of breath (uses rarely). UAD PRN    . allopurinol (ZYLOPRIM) 100 MG tablet Take 100 mg by mouth daily.    . Cholecalciferol (VITAMIN D3) 1000 UNITS tablet Take 1,000 Units by mouth daily.      . metoprolol tartrate (LOPRESSOR) 25 MG tablet Take 1 tablet by mouth 2 (two) times daily.     . Multiple Vitamins-Minerals (PRESERVISION AREDS) TABS Take by mouth 2 (two) times daily.    . ondansetron (ZOFRAN) 4 MG tablet Take 1 tablet (4 mg total) by mouth every 8 (eight) hours as needed for nausea or vomiting. 30 tablet 2  . RABEprazole (ACIPHEX) 20 MG tablet Take 20 mg by mouth daily.    [name] venlafaxine XR (EFFEXOR-XR) 37.5 MG 24 hr capsule Take 37.5 mg by mouth daily.    [name] VIAGRA 100 MG tablet Take 100 mg by mouth daily as needed for erectile dysfunction.     [name]  warfarin (COUMADIN) 5 MG tablet Take 2.5-5 mg by mouth See admin instructions. Take 5 mg on Mondays . Take 2.5 mg all other days.  UAD - managed by Dr. Manuella [name]     No current facility-administered medications for this visit.   Allergies:  Sulfonamide derivatives   ROS:  No palpitations or syncope.  Physical Exam: VS:  BP 136/70   Pulse 70   Ht 5\' 9"  (1.753 m)   Wt 226 lb (102.5 kg)   SpO2 96%   BMI 33.37 kg/m , BMI Body mass index is 33.37 kg/m.  Wt Readings from Last 3 Encounters:  02/18/20 226 lb (102.5 kg)  07/31/19 223 lb (101.2 kg)  05/28/19 225  lb (102.1 kg)    General: Elderly male, appears comfortable at rest. HEENT: Conjunctiva and lids normal, wearing a mask. Neck: Supple, no elevated JVP or carotid bruits, no thyromegaly. Lungs: Clear to auscultation, nonlabored breathing at rest. Cardiac: Irregularly irregular, no S3 or significant systolic murmur. Extremities: No pitting edema, distal pulses 2+.  ECG:  An ECG dated 07/31/2019 was personally reviewed today and demonstrated:  Rate controlled atrial fibrillation.  Recent Labwork: 02/26/2019: TSH 1.75   Other Studies Reviewed Today:  Echocardiogram 10/31/2017 [facility]): Mild LVH with LVEF 70 to 75%, mild left atrial enlargement, mildly sclerotic aortic valve with mild aortic regurgitation, mild to moderate mitral regurgitation, mild tricuspid regurgitation, RVSP estimated 20 to 30 mmHg, mildly dilated aortic root measuring 39 mm.  Assessment and Plan:  1.  Permanent atrial fibrillation with CHA2DS2-VASc score of 3.  Heart rate is well controlled on Lopressor and he continues on Coumadin for stroke prophylaxis per Dr. Manuella [name].  Recent reported INR 2.6, no bleeding problems.  2.  Essential hypertension, blood pressure is adequately controlled today with systolic in the 503T.  No changes made to current regimen.  I reviewed his recent lab work.  Medication Adjustments/Labs and Tests Ordered: Current medicines are reviewed at length with the patient today.  Concerns regarding medicines are outlined above.   Tests Ordered: No orders of the defined types were placed in this encounter.   Medication Changes: No orders of the defined types were placed in this encounter.   Disposition:  Follow up 6 months in their [place] office.  Signed, Nicholas Sark, MD, [facility] 02/18/2020 2:57 PM    [facility] at [place], [place], [place] [address] Phone: [phone]; Fax: [phone]

## 2020-03-07 DIAGNOSIS — Z23 Encounter for immunization: Secondary | ICD-10-CM | POA: Diagnosis not present

## 2020-03-13 DIAGNOSIS — I10 Essential (primary) hypertension: Secondary | ICD-10-CM | POA: Diagnosis not present

## 2020-03-13 DIAGNOSIS — J449 Chronic obstructive pulmonary disease, unspecified: Secondary | ICD-10-CM | POA: Diagnosis not present

## 2020-03-13 DIAGNOSIS — Z299 Encounter for prophylactic measures, unspecified: Secondary | ICD-10-CM | POA: Diagnosis not present

## 2020-03-13 DIAGNOSIS — F339 Major depressive disorder, recurrent, unspecified: Secondary | ICD-10-CM | POA: Diagnosis not present

## 2020-03-13 DIAGNOSIS — I482 Chronic atrial fibrillation, unspecified: Secondary | ICD-10-CM | POA: Diagnosis not present

## 2020-03-13 DIAGNOSIS — Z6835 Body mass index (BMI) 35.0-35.9, adult: Secondary | ICD-10-CM | POA: Diagnosis not present

## 2020-03-13 DIAGNOSIS — I739 Peripheral vascular disease, unspecified: Secondary | ICD-10-CM | POA: Diagnosis not present

## 2020-03-18 DIAGNOSIS — I739 Peripheral vascular disease, unspecified: Secondary | ICD-10-CM | POA: Diagnosis not present

## 2020-03-18 DIAGNOSIS — H353132 Nonexudative age-related macular degeneration, bilateral, intermediate dry stage: Secondary | ICD-10-CM | POA: Diagnosis not present

## 2020-04-16 DIAGNOSIS — I5030 Unspecified diastolic (congestive) heart failure: Secondary | ICD-10-CM | POA: Diagnosis not present

## 2020-04-16 DIAGNOSIS — I10 Essential (primary) hypertension: Secondary | ICD-10-CM | POA: Diagnosis not present

## 2020-04-16 DIAGNOSIS — Z6835 Body mass index (BMI) 35.0-35.9, adult: Secondary | ICD-10-CM | POA: Diagnosis not present

## 2020-04-16 DIAGNOSIS — I482 Chronic atrial fibrillation, unspecified: Secondary | ICD-10-CM | POA: Diagnosis not present

## 2020-04-16 DIAGNOSIS — Z299 Encounter for prophylactic measures, unspecified: Secondary | ICD-10-CM | POA: Diagnosis not present

## 2020-04-30 DIAGNOSIS — Z23 Encounter for immunization: Secondary | ICD-10-CM | POA: Diagnosis not present

## 2020-05-06 DIAGNOSIS — Z79899 Other long term (current) drug therapy: Secondary | ICD-10-CM | POA: Diagnosis not present

## 2020-05-06 DIAGNOSIS — I10 Essential (primary) hypertension: Secondary | ICD-10-CM | POA: Diagnosis not present

## 2020-05-06 DIAGNOSIS — M109 Gout, unspecified: Secondary | ICD-10-CM | POA: Diagnosis not present

## 2020-05-06 DIAGNOSIS — Z1339 Encounter for screening examination for other mental health and behavioral disorders: Secondary | ICD-10-CM | POA: Diagnosis not present

## 2020-05-06 DIAGNOSIS — R5383 Other fatigue: Secondary | ICD-10-CM | POA: Diagnosis not present

## 2020-05-06 DIAGNOSIS — I482 Chronic atrial fibrillation, unspecified: Secondary | ICD-10-CM | POA: Diagnosis not present

## 2020-05-06 DIAGNOSIS — Z7189 Other specified counseling: Secondary | ICD-10-CM | POA: Diagnosis not present

## 2020-05-06 DIAGNOSIS — Z125 Encounter for screening for malignant neoplasm of prostate: Secondary | ICD-10-CM | POA: Diagnosis not present

## 2020-05-06 DIAGNOSIS — Z299 Encounter for prophylactic measures, unspecified: Secondary | ICD-10-CM | POA: Diagnosis not present

## 2020-05-06 DIAGNOSIS — Z1331 Encounter for screening for depression: Secondary | ICD-10-CM | POA: Diagnosis not present

## 2020-05-06 DIAGNOSIS — Z6835 Body mass index (BMI) 35.0-35.9, adult: Secondary | ICD-10-CM | POA: Diagnosis not present

## 2020-05-06 DIAGNOSIS — E7800 Pure hypercholesterolemia, unspecified: Secondary | ICD-10-CM | POA: Diagnosis not present

## 2020-05-06 DIAGNOSIS — Z0000 Encounter for general adult medical examination without abnormal findings: Secondary | ICD-10-CM | POA: Diagnosis not present

## 2020-06-03 DIAGNOSIS — I739 Peripheral vascular disease, unspecified: Secondary | ICD-10-CM | POA: Diagnosis not present

## 2020-06-05 DIAGNOSIS — Z6835 Body mass index (BMI) 35.0-35.9, adult: Secondary | ICD-10-CM | POA: Diagnosis not present

## 2020-06-05 DIAGNOSIS — I482 Chronic atrial fibrillation, unspecified: Secondary | ICD-10-CM | POA: Diagnosis not present

## 2020-06-05 DIAGNOSIS — I739 Peripheral vascular disease, unspecified: Secondary | ICD-10-CM | POA: Diagnosis not present

## 2020-06-05 DIAGNOSIS — I10 Essential (primary) hypertension: Secondary | ICD-10-CM | POA: Diagnosis not present

## 2020-06-05 DIAGNOSIS — F339 Major depressive disorder, recurrent, unspecified: Secondary | ICD-10-CM | POA: Diagnosis not present

## 2020-06-05 DIAGNOSIS — Z299 Encounter for prophylactic measures, unspecified: Secondary | ICD-10-CM | POA: Diagnosis not present

## 2020-06-24 ENCOUNTER — Other Ambulatory Visit: Payer: Self-pay

## 2020-06-24 ENCOUNTER — Ambulatory Visit (INDEPENDENT_AMBULATORY_CARE_PROVIDER_SITE_OTHER): Payer: Medicare Other | Admitting: Urology

## 2020-06-24 ENCOUNTER — Telehealth (HOSPITAL_COMMUNITY): Payer: Self-pay

## 2020-06-24 ENCOUNTER — Encounter: Payer: Self-pay | Admitting: Urology

## 2020-06-24 VITALS — BP 127/84 | HR 71 | Temp 98.6°F | Ht 69.0 in | Wt 220.0 lb

## 2020-06-24 DIAGNOSIS — Z8546 Personal history of malignant neoplasm of prostate: Secondary | ICD-10-CM

## 2020-06-24 LAB — URINALYSIS, ROUTINE W REFLEX MICROSCOPIC
Bilirubin, UA: NEGATIVE
Glucose, UA: NEGATIVE
Nitrite, UA: NEGATIVE
Protein,UA: NEGATIVE
RBC, UA: NEGATIVE
Specific Gravity, UA: 1.02 (ref 1.005–1.030)
Urobilinogen, Ur: 0.2 mg/dL (ref 0.2–1.0)
pH, UA: 5.5 (ref 5.0–7.5)

## 2020-06-24 LAB — MICROSCOPIC EXAMINATION
Bacteria, UA: NONE SEEN
Epithelial Cells (non renal): NONE SEEN /hpf (ref 0–10)
RBC: NONE SEEN /hpf (ref 0–2)
Renal Epithel, UA: NONE SEEN /hpf

## 2020-06-24 MED ORDER — VIAGRA 100 MG PO TABS: 100.0000 mg | ORAL_TABLET | Freq: Every day | ORAL | 3 refills | Status: DC | PRN

## 2020-06-24 NOTE — Progress Notes (Signed)
Chief Complaint: Followup of PCa    12.28.2021: Most recent PSA 0.3 (11.9.2021). No blood in urine/stool.  IPSS 12  QoL score 1  Occasional UUI. Not bad enough to be on meds.  (below copied from [facility] records):  CC: I have prostate cancer (treatment). HPI: Nicholas [name] is a 86 year-old male established patient who is here for prostate cancer which has been treated.  He initially presented in March, 2013, already with a diagnosis of prostate cancer, established in July of 2011. At that time, he had an elevated PSA and underwent TRUS/Bx. 2/12 biopsies both the right side, revealed GS 3+3 pattern. The patient went to [facility] for consultation with Dr Brendia Sacks, who recommended robotic removal of his prostate, despite his age of 54. The patient requested a second opinion with the radiotherapy department there, and it was recommended that he be followed conservatively with active surveillance. He initially did that, with his PSA staying stable at between 6 and 7, until 5 March, 2013 when it went up to 9.5.  He then underwent repeat TRUS/Bx here in April, 2013. 7/12 cores came back positive for adenocarcinoma on that biopsy. 3/12 revealed a GS 3+3 pattern, 4/12 revealed a GS 3+4 pattern. It was recommended that he undergo radiotherapy (EBRT), which he completed in late September, 2013.   12.18.2020: Most recent PSA 0.4 -- he was slightly concerned over this slight increase. He denies any blood per urine or stool, though he was told after recent colonoscopy to expect some blood per stool as he has some internal hemorrhoids.   CC/HPI: I have erectile dysfunction (Meds).  He still uses Viagra  CC: BPH  HPI: The patient complains of lower urinary tract symptom(s) that include frequency, weak stream, nocturia, and sense of incomplete emptying. The patient states his most bothersome symptom(s) are the following: nocturia. His symptoms have been stable over the last year.  The patient states if he were to spend the rest of his life with his current urinary condition, he would be pleased.   Following his radiotherapy, his LUTS were treated with tamsulosin and oxybutynin. He currently is on neither of these meds.   12.8.2020: He continues to have stable urinary sx's and remains off these medications. His only real complaint is his freq and his 2x nocturia but even these are tolerated well. He was previously treated with a fluid pill for his urination but this did not help him much and was subsequently discontinued.    Past Medical History:  Diagnosis Date  . Atrial fibrillation ([place])   . Cervical spondylosis without myelopathy   . COPD (chronic obstructive pulmonary disease) ([place])   . Diverticulosis of colon (without mention of hemorrhage)   . Esophageal reflux   . Essential hypertension   . Malignant neoplasm of prostate ([place])   . Obstructive sleep apnea   . Osteoarthritis     Past Surgical History:  Procedure Laterality Date  . CHOLECYSTECTOMY N/A 07/03/2014   Procedure: LAPAROSCOPIC CHOLECYSTECTOMY;  Surgeon: [name], MD;  Location: [facility];  Service: General;  Laterality: N/A;  . COLONOSCOPY    . COLONOSCOPY N/A 12/18/2015   Procedure: COLONOSCOPY;  Surgeon: [name], MD;  Location: [facility];  Service: Endoscopy;  Laterality: N/A;  1200  . ESOPHAGEAL DILATION N/A 08/04/2016   Procedure: ESOPHAGEAL DILATION;  Surgeon: [name], MD;  Location: [facility];  Service: Endoscopy;  Laterality: N/A;  . ESOPHAGOGASTRODUODENOSCOPY N/A 08/04/2016   Procedure:  ESOPHAGOGASTRODUODENOSCOPY (EGD);  Surgeon: Malissa HippoNajeeb U Rehman, MD;  Location[facility];  Service: Endoscopy;  Laterality: N/A;  12:00  . ESOPHAGOGASTRODUODENOSCOPY (EGD) WITH ESOPHAGEAL DILATION N/A 06/18/2013   Procedure: ESOPHAGOGASTRODUODENOSCOPY (EGD) WITH ESOPHAGEAL DILATION;  Surgeon: Malissa HippoNajeeb U Rehman, MD;  Location: [facility];  Service: Endoscopy;  Laterality: N/A;   220-rescheduled to 730 Ann to notify pt  . ROTATOR CUFF REPAIR    . TOTAL KNEE ARTHROPLASTY     2000, 2005.     Home Medications:  Allergies as of 06/24/2020      Reactions   Sulfonamide Derivatives Rash      Medication List       Accurate as of June 24, 2020 12:14 PM. If you have any questions, ask your nurse or doctor.        allopurinol 100 MG tablet Commonly known as: ZYLOPRIM Take 100 mg by mouth daily.   cholecalciferol 25 MCG (1000 UNIT) tablet Commonly known as: VITAMIN D Take 1,000 Units by mouth daily.   Combivent 18-103 MCG/ACT inhaler Generic drug: albuterol-ipratropium Inhale 2 puffs into the lungs every 6 (six) hours as needed for wheezing or shortness of breath (uses rarely). UAD PRN   metoprolol tartrate 25 MG tablet Commonly known as: LOPRESSOR Take 1 tablet by mouth 2 (two) times daily.   ondansetron 4 MG tablet Commonly known as: ZOFRAN Take 1 tablet (4 mg total) by mouth every 8 (eight) hours as needed for nausea or vomiting.   PreserVision AREDS Tabs Take by mouth 2 (two) times daily.   RABEprazole 20 MG tablet Commonly known as: ACIPHEX Take 20 mg by mouth daily.   venlafaxine XR 37.5 MG 24 hr capsule Commonly known as: EFFEXOR-XR Take 37.5 mg by mouth daily.   Viagra 100 MG tablet Generic drug: sildenafil Take 100 mg by mouth daily as needed for erectile dysfunction.   warfarin 5 MG tablet Commonly known as: COUMADIN Take 2.5-5 mg by mouth See admin instructions. Take 5 mg on Mondays . Take 2.5 mg all other days.  UAD - managed by Dr. Sherryll BurgerShah       Allergies:  Allergies  Allergen Reactions  . Sulfonamide Derivatives Rash    Family History  Problem Relation Age of Onset  . Hypertension Father   . Heart attack Maternal Uncle     Social History:  reports that he quit smoking about 55 years ago. His smoking use included cigarettes. He started smoking about 73 years ago. He has a 18.00 pack-year smoking history. He quit  smokeless tobacco use about 53 years ago.  His smokeless tobacco use included chew. He reports that he does not drink alcohol and does not use drugs.  ROS: A complete review of systems was performed.  All systems are negative except for pertinent findings as noted.  Physical Exam:  Vital signs in last 24 hours: There were no vitals taken for this visit. Constitutional:  Alert and oriented, No acute distress Cardiovascular: Regular rate  Respiratory: Normal respiratory effort GI: Abdomen is soft, nontender, nondistended, no abdominal masses. Obese, no hernias. Genitourinary: Normal male phallus, testes are descended bilaterally and non-tender and without masses, scrotum is normal in appearance without lesions or masses, perineum is normal on inspection. Prostate smooth/flat. Lymphatic: No lymphadenopathy Neurologic: Grossly intact, no focal deficits Psychiatric: Normal mood and affect I have reviewed prior pt notes  I have reviewed notes from referring/previous physicians  I have reviewed urinalysis results  I have reviewed prior PSA results  Impression/Assessment:  PCa--s/p EBRT w/ continued excellent results  ED--uses sildenafil  Plan:  Sildenafil refilled  OV 1 year

## 2020-06-24 NOTE — Telephone Encounter (Signed)
Appointment - Representative, Natashia from the [facility] called to follow up on a referral she stated was placed for patient and to see if he had been scheduled for an appointment. None observed at this time and could not confirm if one received per privacy issues.  Informed collateral if one was received and the person was appropriate, then they would be contacted.

## 2020-06-24 NOTE — Progress Notes (Signed)
Urological Symptom Review  Patient is experiencing the following symptoms: Frequent urination Hard to postpone urination Get up at night to urinate Leakage of urine Erection problems (male only)   Review of Systems  Gastrointestinal (upper)  : Nausea Indigestion/heartburn  Gastrointestinal (lower) : Negative for lower GI symptoms  Constitutional : Fatigue  Skin: Negative for skin symptoms  Eyes: Negative for eye symptoms  Ear/Nose/Throat : Sinus problems  Hematologic/Lymphatic: Bruise/Bleed easily  Cardiovascular : Negative for cardiovascular symptoms  Respiratory : Shortness of breath  Endocrine: Negative for endocrine symptoms  Musculoskeletal: Back pain  Neurological: Negative for neurological symptoms  Psychologic: Anxiety

## 2020-07-03 DIAGNOSIS — Z6835 Body mass index (BMI) 35.0-35.9, adult: Secondary | ICD-10-CM | POA: Diagnosis not present

## 2020-07-03 DIAGNOSIS — I482 Chronic atrial fibrillation, unspecified: Secondary | ICD-10-CM | POA: Diagnosis not present

## 2020-07-03 DIAGNOSIS — G4700 Insomnia, unspecified: Secondary | ICD-10-CM | POA: Diagnosis not present

## 2020-07-03 DIAGNOSIS — Z299 Encounter for prophylactic measures, unspecified: Secondary | ICD-10-CM | POA: Diagnosis not present

## 2020-07-03 DIAGNOSIS — I10 Essential (primary) hypertension: Secondary | ICD-10-CM | POA: Diagnosis not present

## 2020-07-03 DIAGNOSIS — F339 Major depressive disorder, recurrent, unspecified: Secondary | ICD-10-CM | POA: Diagnosis not present

## 2020-07-03 DIAGNOSIS — Z87891 Personal history of nicotine dependence: Secondary | ICD-10-CM | POA: Diagnosis not present

## 2020-07-07 DIAGNOSIS — L570 Actinic keratosis: Secondary | ICD-10-CM | POA: Diagnosis not present

## 2020-07-08 ENCOUNTER — Other Ambulatory Visit: Payer: Self-pay | Admitting: Urology

## 2020-07-08 ENCOUNTER — Telehealth: Payer: Self-pay

## 2020-07-08 DIAGNOSIS — N5201 Erectile dysfunction due to arterial insufficiency: Secondary | ICD-10-CM

## 2020-07-08 MED ORDER — SILDENAFIL CITRATE 100 MG PO TABS: 100.0000 mg | ORAL_TABLET | Freq: Every day | ORAL | 3 refills | Status: DC | PRN

## 2020-07-28 NOTE — Telephone Encounter (Signed)
Resolved

## 2020-08-04 DIAGNOSIS — I5030 Unspecified diastolic (congestive) heart failure: Secondary | ICD-10-CM | POA: Diagnosis not present

## 2020-08-04 DIAGNOSIS — Z299 Encounter for prophylactic measures, unspecified: Secondary | ICD-10-CM | POA: Diagnosis not present

## 2020-08-04 DIAGNOSIS — I10 Essential (primary) hypertension: Secondary | ICD-10-CM | POA: Diagnosis not present

## 2020-08-04 DIAGNOSIS — G4700 Insomnia, unspecified: Secondary | ICD-10-CM | POA: Diagnosis not present

## 2020-08-04 DIAGNOSIS — M1990 Unspecified osteoarthritis, unspecified site: Secondary | ICD-10-CM | POA: Diagnosis not present

## 2020-08-04 DIAGNOSIS — I482 Chronic atrial fibrillation, unspecified: Secondary | ICD-10-CM | POA: Diagnosis not present

## 2020-08-12 DIAGNOSIS — I739 Peripheral vascular disease, unspecified: Secondary | ICD-10-CM | POA: Diagnosis not present

## 2020-08-25 NOTE — Progress Notes (Signed)
Cardiology Office Note  Date: 08/26/2020   ID: Nicholas [name], DOB [birth year], MRN [id number]  PCP:  [name], MD  Cardiologist:  [name], MD Electrophysiologist:  None   Chief Complaint  Patient presents with  . Cardiac follow-up    History of Present Illness: Nicholas [name] is an 87 y.o. male last seen in August 2021.  He presents for a routine visit.  Still no sense of palpitations or chest pain.  Complains of fairly chronic feeling of fatigue, dyspnea on exertion but he is able to pace himself.  He remains functional with ADLs.  He is following to the [facility] system, underwent PFTs with plan for pulmonary evaluation in June.  We discussed getting a follow-up echocardiogram in comparison to previous study done at [facility] in 2019.  I personally reviewed his ECG today which shows rate controlled atrial fibrillation with low voltage.  He remains on Coumadin with follow-up by [name].  No reported bleeding problems.  I reviewed his most recent lab work.  Past Medical History:  Diagnosis Date  . Atrial fibrillation ([place])   . Cervical spondylosis without myelopathy   . COPD (chronic obstructive pulmonary disease) ([place])   . Diverticulosis of colon (without mention of hemorrhage)   . Esophageal reflux   . Essential hypertension   . Malignant neoplasm of prostate ([place])   . Obstructive sleep apnea   . Osteoarthritis     Past Surgical History:  Procedure Laterality Date  . CHOLECYSTECTOMY N/A 07/03/2014   Procedure: LAPAROSCOPIC CHOLECYSTECTOMY;  Surgeon: [name], MD;  Location: [facility] ORS;  Service: General;  Laterality: N/A;  . COLONOSCOPY    . COLONOSCOPY N/A 12/18/2015   Procedure: COLONOSCOPY;  Surgeon: [name], MD;  Location: [facility] ENDO SUITE;  Service: Endoscopy;  Laterality: N/A;  1200  . ESOPHAGEAL DILATION N/A 08/04/2016   Procedure: ESOPHAGEAL DILATION;  Surgeon: [name], MD;  Location: [facility] END[facility];  Service: Endoscopy;   Laterality: N/A;  . ESOPHAGOGASTRODUODENOSCOPY N/A 08/04/2016   Procedure: ESOPHAGOGASTRODUODENOSCOPY (EGD);  Surgeon: [name], MD;  Location: [facility];  Service: Endoscopy;  Laterality: N/A;  12:00  . ESOPHAGOGASTRODUODENOSCOPY (EGD) WITH ESOPHAGEAL DILATION N/A 06/18/2013   Procedure: ESOPHAGOGASTRODUODENOSCOPY (EGD) WITH ESOPHAGEAL DILATION;  Surgeon: [name], MD;  Location: [facility];  Service: Endoscopy;  Laterality: N/A;  220-rescheduled to 730 Ann to notify pt  . ROTATOR CUFF REPAIR    . TOTAL KNEE ARTHROPLASTY     2000, 2005.     Current Outpatient Medications  Medication Sig Dispense Refill  . albuterol-ipratropium (COMBIVENT) 18-103 MCG/ACT inhaler Inhale 2 puffs into the lungs every 6 (six) hours as needed for wheezing or shortness of breath (uses rarely). UAD PRN    . allopurinol (ZYLOPRIM) 100 MG tablet Take 100 mg by mouth daily.    [name] ELLIPTA 62.5-25 MCG/INH AEPB Inhale 1 puff into the lungs daily.    . Cholecalciferol (VITAMIN D3) 1000 UNITS tablet Take 1,000 Units by mouth daily.    . metoprolol tartrate (LOPRESSOR) 25 MG tablet Take 1 tablet by mouth 2 (two) times daily.     . Multiple Vitamins-Minerals (PRESERVISION AREDS) TABS Take by mouth 2 (two) times daily.    . ondansetron (ZOFRAN) 4 MG tablet Take 1 tablet (4 mg total) by mouth every 8 (eight) hours as needed for nausea or vomiting. 30 tablet 2  . RABEprazole (ACIPHEX) 20 MG tablet Take 20 mg by  mouth daily.    . sildenafil (VIAGRA) 100 MG tablet Take 1 tablet (100 mg total) by mouth daily as needed for erectile dysfunction. 15 tablet 3  . venlafaxine XR (EFFEXOR-XR) 37.5 MG 24 hr capsule Take 37.5 mg by mouth daily.    [name] VIAGRA 100 MG tablet Take 1 tablet (100 mg total) by mouth daily as needed for erectile dysfunction. 16 tablet 3  . warfarin (COUMADIN) 5 MG tablet Take 2.5-5 mg by mouth See admin instructions. Take 5 mg on Mondays . Take 2.5 mg all other days.  UAD - managed by Dr.  Manuella Ghazi     No current facility-administered medications for this visit.   Allergies:  Sulfonamide derivatives   ROS: No falls, no syncope.  Physical Exam: VS:  BP 132/74   Pulse 64   Ht 5\' 9"  (1.753 m)   Wt 234 lb (106.1 kg)   SpO2 95%   BMI 34.56 kg/m , BMI Body mass index is 34.56 kg/m.  Wt Readings from Last 3 Encounters:  08/26/20 234 lb (106.1 kg)  06/24/20 220 lb (99.8 kg)  02/18/20 226 lb (102.5 kg)    General: Elderly male, appears comfortable at rest. HEENT: Conjunctiva and lids normal, wearing a mask. Neck: Supple, no elevated JVP or carotid bruits, no thyromegaly. Lungs: Clear to auscultation, nonlabored breathing at rest. Cardiac: Irregularly irregular, no S3, soft systolic murmur. Extremities: No pitting edema.  ECG:  An ECG dated 07/31/2019 was personally reviewed today and demonstrated:  Rate controlled atrial fibrillation with leftward axis.  Recent Labwork:  May 2021: Cholesterol 155, triglycerides 106, HDL 59, LDL 75, hemoglobin 14.1, platelets 212, potassium 4.5, BUN 14, creatinine 0.91, AST 21, ALT 12, TSH 1.67 November 2021: Hemoglobin 13.4, platelets 229, cholesterol 157, triglycerides 68, HDL 58, LDL 86, BUN 13, creatinine 0.9, potassium 5.0, AST 29, ALT 8, TSH 1.39  Other Studies Reviewed Today:  Echocardiogram 10/31/2017 [facility]): Mild LVH with LVEF 70 to 75%, mild left atrial enlargement, mildly sclerotic aortic valve with mild aortic regurgitation, mild to moderate mitral regurgitation, mild tricuspid regurgitation, RVSP estimated 20 to 30 mmHg, mildly dilated aortic root measuring 39 mm.  Assessment and Plan:  1.  Permanent atrial fibrillation.  CHA2DS2-VASc score is 3.  ECG reviewed showing good heart rate control and he remains asymptomatic in terms of palpitations.  Continue Lopressor as well as Coumadin, anticoagulation followed by [name].  2.  Shortness of breath and sense of fatigue, fairly chronic.  He is being evaluated  through the [facility] system, pulmonary consultation in June.  We will update his echocardiogram to ensure no significant change compared to 2019.  3.  Essential hypertension, systolic is in the 161W today.  No changes to current regimen.  Medication Adjustments/Labs and Tests Ordered: Current medicines are reviewed at length with the patient today.  Concerns regarding medicines are outlined above.   Tests Ordered: Orders Placed This Encounter  Procedures  . EKG 12-Lead  . ECHOCARDIOGRAM COMPLETE    Medication Changes: No orders of the defined types were placed in this encounter.   Disposition:  Follow up 6 months in the [place] office.  Signed, Satira Sark, MD, [facility] 08/26/2020 11:43 AM    [place] [facility] [address], [place], [place] [address] Phone: [phone]; Fax: [phone]

## 2020-08-26 ENCOUNTER — Encounter: Payer: Self-pay | Admitting: Cardiology

## 2020-08-26 ENCOUNTER — Ambulatory Visit (INDEPENDENT_AMBULATORY_CARE_PROVIDER_SITE_OTHER): Payer: Medicare Other | Admitting: Cardiology

## 2020-08-26 VITALS — BP 132/74 | HR 64 | Ht 69.0 in | Wt 234.0 lb

## 2020-08-26 DIAGNOSIS — I4821 Permanent atrial fibrillation: Secondary | ICD-10-CM

## 2020-08-26 DIAGNOSIS — R0602 Shortness of breath: Secondary | ICD-10-CM

## 2020-08-26 DIAGNOSIS — I10 Essential (primary) hypertension: Secondary | ICD-10-CM

## 2020-08-26 NOTE — Patient Instructions (Addendum)

## 2020-09-01 DIAGNOSIS — I5030 Unspecified diastolic (congestive) heart failure: Secondary | ICD-10-CM | POA: Diagnosis not present

## 2020-09-01 DIAGNOSIS — Z299 Encounter for prophylactic measures, unspecified: Secondary | ICD-10-CM | POA: Diagnosis not present

## 2020-09-01 DIAGNOSIS — J019 Acute sinusitis, unspecified: Secondary | ICD-10-CM | POA: Diagnosis not present

## 2020-09-01 DIAGNOSIS — I482 Chronic atrial fibrillation, unspecified: Secondary | ICD-10-CM | POA: Diagnosis not present

## 2020-09-01 DIAGNOSIS — Z6836 Body mass index (BMI) 36.0-36.9, adult: Secondary | ICD-10-CM | POA: Diagnosis not present

## 2020-09-01 DIAGNOSIS — I10 Essential (primary) hypertension: Secondary | ICD-10-CM | POA: Diagnosis not present

## 2020-09-11 ENCOUNTER — Ambulatory Visit (HOSPITAL_COMMUNITY)
Admission: RE | Admit: 2020-09-11 | Discharge: 2020-09-11 | Disposition: A | Discharge status: Home / Self Care | Payer: Medicare Other | Source: Ambulatory Visit | Attending: Cardiology | Admitting: Cardiology

## 2020-09-11 ENCOUNTER — Other Ambulatory Visit: Payer: Self-pay

## 2020-09-11 DIAGNOSIS — R0602 Shortness of breath: Secondary | ICD-10-CM | POA: Diagnosis not present

## 2020-09-11 LAB — ECHOCARDIOGRAM COMPLETE
Area-P 1/2: 3.74 cm2
MV M vel: 4.65 m/s
MV Peak grad: 86.5 mmHg
P 1/2 time: 635 msec
S' Lateral: 3.3 cm

## 2020-09-11 NOTE — Progress Notes (Signed)
*  PRELIMINARY RESULTS* Echocardiogram 2D Echocardiogram has been performed.  Nicholas [name] 09/11/2020, 10:13 AM

## 2020-09-12 ENCOUNTER — Telehealth: Payer: Self-pay | Admitting: *Deleted

## 2020-09-12 NOTE — Telephone Encounter (Signed)
-----   Message from Satira Sark, MD sent at 09/11/2020 11:37 AM EDT ----- Results reviewed.  LVEF remains normal range at 55 to 60%.  Estimated RV systolic pressure also normal range.  Both atria are dilated, would expect this with his history and atrial fibrillation.  Aortic regurgitation is only mild as is mild mitral regurgitation, neither of which should be causing active symptoms.  The mildly dilated aortic root is also asymptomatic.  We will continue with current medications and follow-up plan.

## 2020-09-17 NOTE — Telephone Encounter (Signed)
Patient informed. Copy sent to PCP °

## 2020-10-02 DIAGNOSIS — I482 Chronic atrial fibrillation, unspecified: Secondary | ICD-10-CM | POA: Diagnosis not present

## 2020-10-02 DIAGNOSIS — Z6835 Body mass index (BMI) 35.0-35.9, adult: Secondary | ICD-10-CM | POA: Diagnosis not present

## 2020-10-02 DIAGNOSIS — I10 Essential (primary) hypertension: Secondary | ICD-10-CM | POA: Diagnosis not present

## 2020-10-02 DIAGNOSIS — I739 Peripheral vascular disease, unspecified: Secondary | ICD-10-CM | POA: Diagnosis not present

## 2020-10-02 DIAGNOSIS — Z299 Encounter for prophylactic measures, unspecified: Secondary | ICD-10-CM | POA: Diagnosis not present

## 2020-10-02 DIAGNOSIS — M549 Dorsalgia, unspecified: Secondary | ICD-10-CM | POA: Diagnosis not present

## 2020-10-07 DIAGNOSIS — Z23 Encounter for immunization: Secondary | ICD-10-CM | POA: Diagnosis not present

## 2020-10-11 IMAGING — MR MR CERVICAL SPINE W/O CM
4 of 5 series · 27 of 48 positions shown · non-contrast
Comparison: None available.

CLINICAL DATA: Initial evaluation for neck pain with numbness in
hands and legs. Balance difficulty.

EXAM:
MRI CERVICAL SPINE WITHOUT CONTRAST
TECHNIQUE: Multiplanar, multisequence MR imaging of the cervical spine was
performed. No intravenous contrast was administered.

[Series 6: T1 · sagittal · 3.0mm · 0.66mm/px · 7 of 15 slices shown]
[im 1/15]
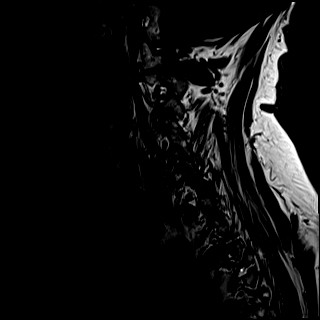
[im 3/15]
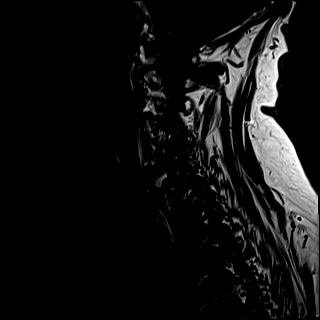
[im 5/15]
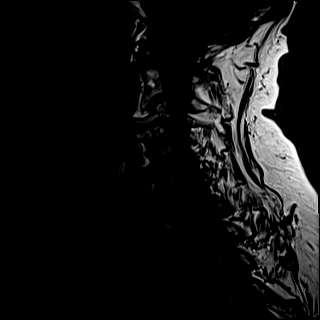
[im 8/15]
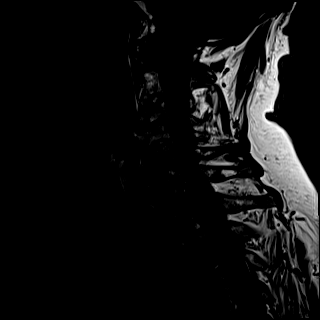
[im 10/15]
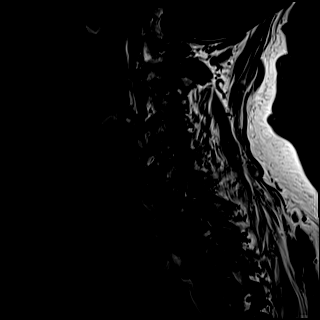
[im 12/15]
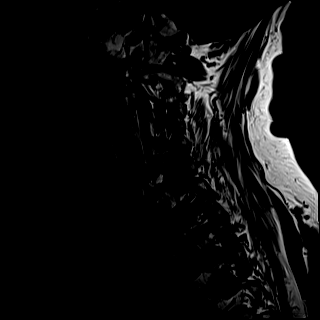
[im 15/15]
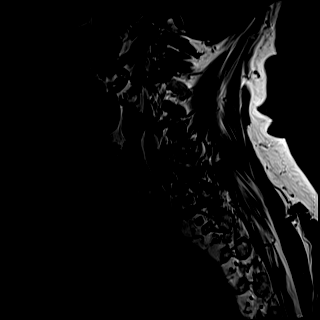

[Series 7: T2 · sagittal · 3.0mm · 0.55mm/px · 7 of 15 slices shown (1 of 2)]
[im 1/15]
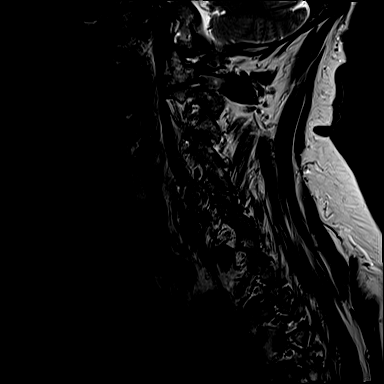
[im 3/15]
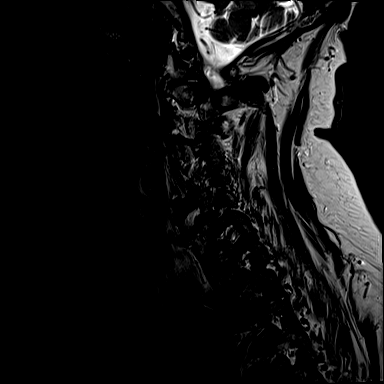
[im 5/15]
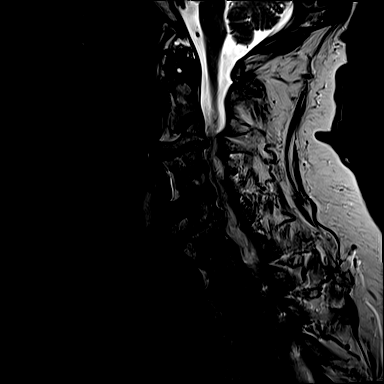
[im 8/15]
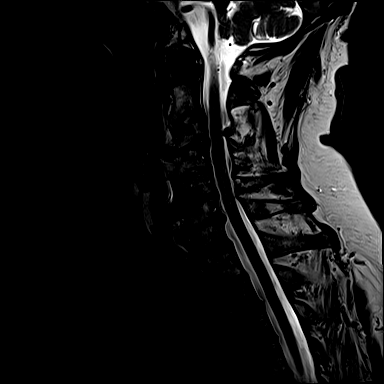
[im 10/15]
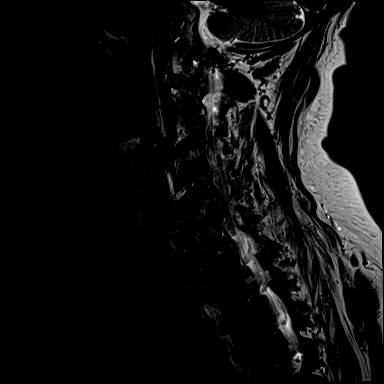
[im 12/15]
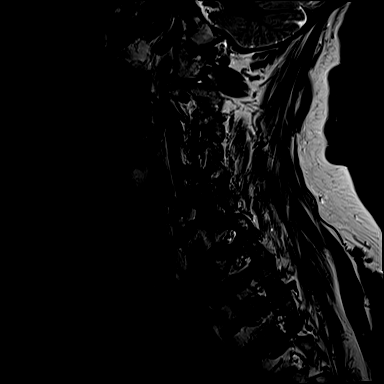
[im 15/15]
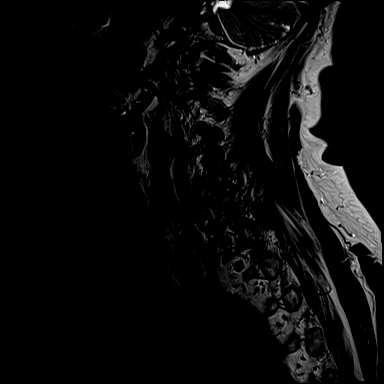

[Series 8: STIR · sagittal · 3.0mm · 0.33mm/px · 4 of 15 slices shown]
[im 1/15]
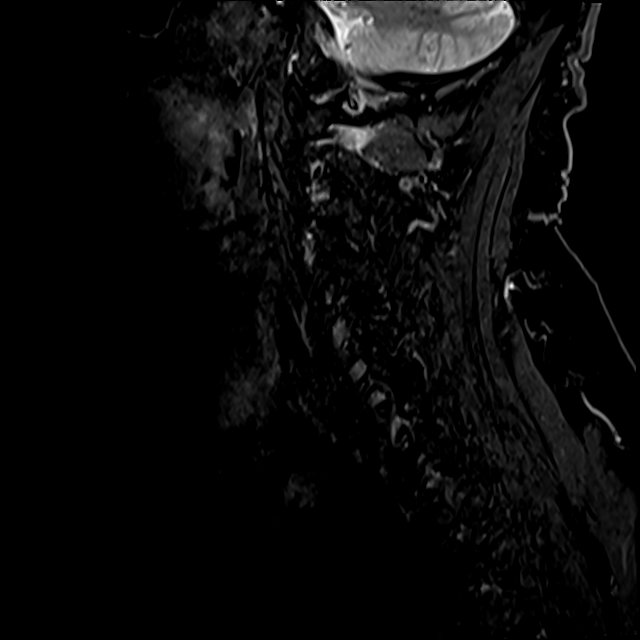
[im 3/15]
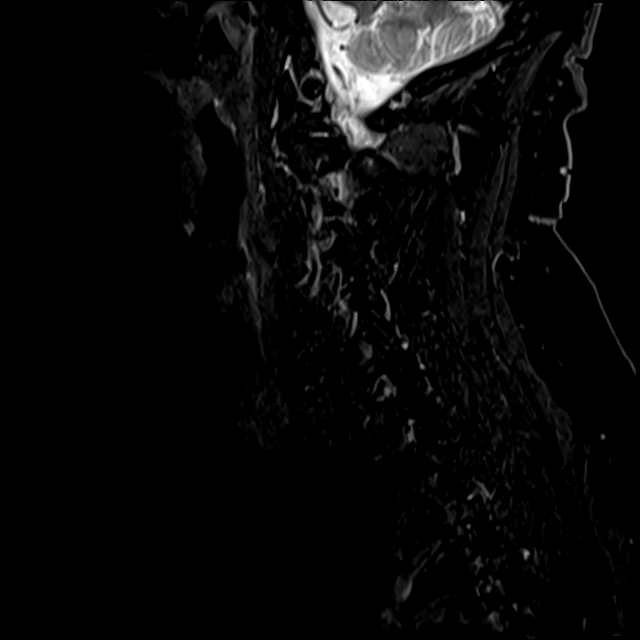
[im 9/15]
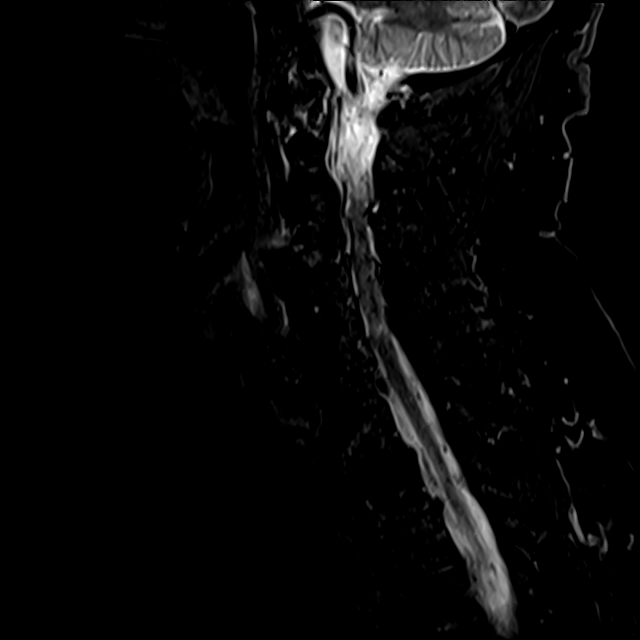
[im 13/15]
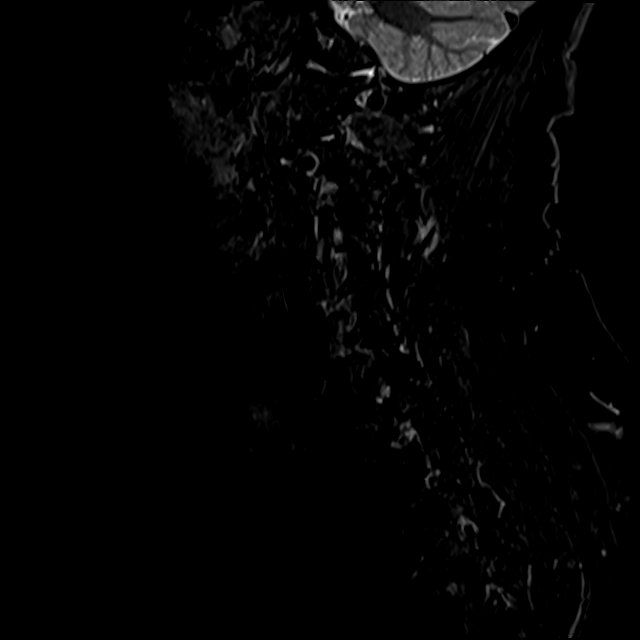

[Series 9: T2 · axial · 3.0mm · 0.50mm/px · z∈[-65,+21]mm · 9 of 25 slices shown (2 of 2)]
[im 1/25]
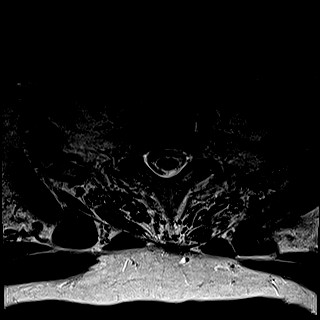
[im 5/25]
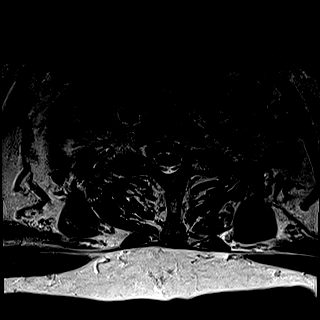
[im 9/25]
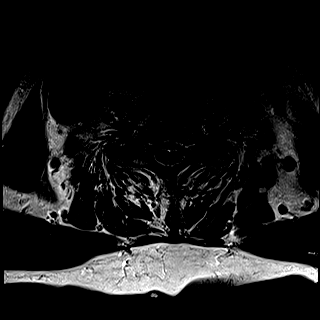
[im 11/25]
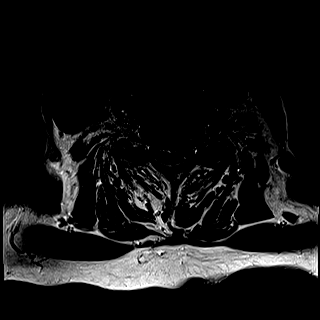
[im 13/25]
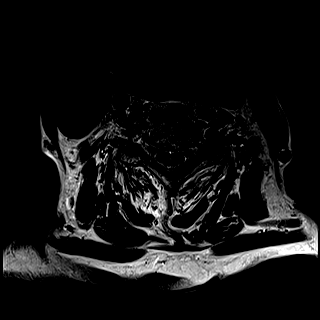
[im 15/25]
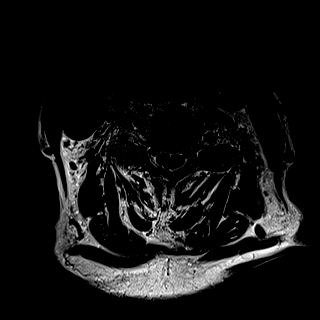
[im 17/25]
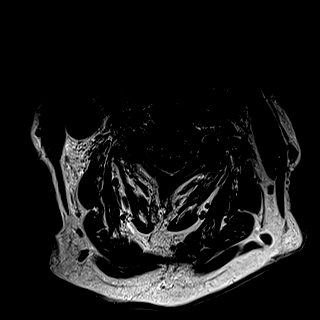
[im 21/25]
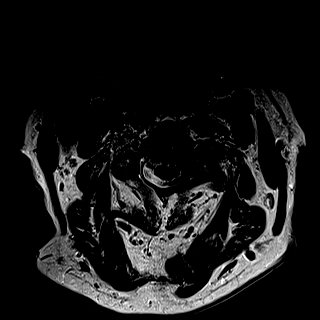
[im 25/25]
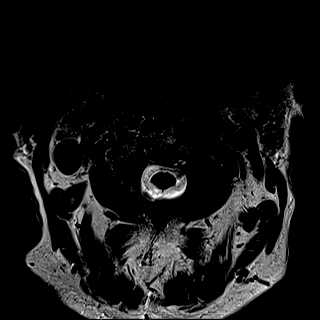

[27 of 48 positions shown; findings below may reference images not displayed]

FINDINGS: Alignment: Straightening of the normal cervical lordosis. Trace
anterolisthesis of C2 on C3, with trace retrolisthesis of C3 on C4.
Trace anterolisthesis of C7 on T1 and T1 on T2. Findings felt to be
chronic and facet mediated.

Vertebrae: Vertebral body height maintained without evidence for
acute or chronic fracture. C4 and C5 vertebral bodies are ankylosed,
likely degenerative. Underlying bone marrow signal intensity
diffusely heterogeneous but within normal limits. No discrete or
worrisome osseous lesions. Mild reactive endplate changes noted
throughout the visualized cervicothoracic spine. No other abnormal
marrow edema.

Cord: Signal intensity within the cervical spinal cord is normal.
Normal cord caliber and morphology.

Posterior Fossa, vertebral arteries, paraspinal tissues: Visualized
brain and posterior fossa within normal limits. Craniocervical
junction normal. Paraspinous and prevertebral soft tissues are
normal. Normal intravascular flow voids seen within the vertebral
arteries bilaterally. Strongly dominant left vertebral artery noted.

Disc levels:

C2-C3: Trace anterolisthesis. Advanced left-sided facet
degeneration. No significant disc bulge. Mild left C3 foraminal
narrowing. No significant right foraminal encroachment. Central
canal widely patent.

C3-C4: Chronic intervertebral disc space narrowing with diffuse
degenerative disc osteophyte. Broad posterior component flattens and
indents the ventral thecal sac. Superimposed mild facet and ligament
flavum hypertrophy. Resultant mild canal stenosis without cord
deformity. Severe right with moderate left C4 foraminal stenosis.

C4-C5: C4 and C5 vertebral bodies are largely ankylosed as is the
right C4-5 facet. Posterior osseous ridging mildly indents the
ventral thecal sac without significant spinal stenosis. Foramina
remain patent.

C5-C6: Chronic intervertebral disc space narrowing with diffuse
degenerative disc osteophyte. Flattening of the ventral thecal sac
with resultant mild spinal stenosis. Superimposed moderate facet
hypertrophy, slightly greater on the left. Moderate bilateral C6
foraminal narrowing.

C6-C7: Chronic intervertebral disc space narrowing with diffuse
degenerative disc osteophyte. Flattening and partial effacement of
the ventral thecal sac. Superimposed left greater than right facet
hypertrophy. Resultant mild spinal stenosis without cord deformity.
Severe bilateral C7 foraminal narrowing.

C7-T1: Trace anterolisthesis. Chronic intervertebral disc space
narrowing with mild disc bulge. Left greater than right facet
hypertrophy. No significant canal or foraminal stenosis.

Visualized upper thoracic spine demonstrates mild disc bulging at
T1-2 through T4-5 without significant stenosis.
IMPRESSION: 1. Moderate multilevel cervical spondylosis as above. Resultant mild
spinal stenosis at C3-4, C5-6, and C6-7.
2. Multifactorial degenerative changes with resultant multilevel
foraminal narrowing. Notable findings include severe right with
moderate left C4 foraminal stenosis, moderate bilateral C6 foraminal
narrowing, with severe bilateral C7 foraminal stenosis.
3. Ankylosis of the C4 and C5 vertebral bodies, presumably chronic
and degenerative.

## 2020-11-05 DIAGNOSIS — I482 Chronic atrial fibrillation, unspecified: Secondary | ICD-10-CM | POA: Diagnosis not present

## 2020-11-05 DIAGNOSIS — I10 Essential (primary) hypertension: Secondary | ICD-10-CM | POA: Diagnosis not present

## 2020-11-05 DIAGNOSIS — I5030 Unspecified diastolic (congestive) heart failure: Secondary | ICD-10-CM | POA: Diagnosis not present

## 2020-11-05 DIAGNOSIS — Z6836 Body mass index (BMI) 36.0-36.9, adult: Secondary | ICD-10-CM | POA: Diagnosis not present

## 2020-11-05 DIAGNOSIS — Z299 Encounter for prophylactic measures, unspecified: Secondary | ICD-10-CM | POA: Diagnosis not present

## 2020-11-11 DIAGNOSIS — I739 Peripheral vascular disease, unspecified: Secondary | ICD-10-CM | POA: Diagnosis not present

## 2020-12-08 DIAGNOSIS — Z6836 Body mass index (BMI) 36.0-36.9, adult: Secondary | ICD-10-CM | POA: Diagnosis not present

## 2020-12-08 DIAGNOSIS — I10 Essential (primary) hypertension: Secondary | ICD-10-CM | POA: Diagnosis not present

## 2020-12-08 DIAGNOSIS — I482 Chronic atrial fibrillation, unspecified: Secondary | ICD-10-CM | POA: Diagnosis not present

## 2020-12-08 DIAGNOSIS — I5030 Unspecified diastolic (congestive) heart failure: Secondary | ICD-10-CM | POA: Diagnosis not present

## 2020-12-08 DIAGNOSIS — Z299 Encounter for prophylactic measures, unspecified: Secondary | ICD-10-CM | POA: Diagnosis not present

## 2020-12-22 DIAGNOSIS — I5030 Unspecified diastolic (congestive) heart failure: Secondary | ICD-10-CM | POA: Diagnosis not present

## 2020-12-22 DIAGNOSIS — Z6836 Body mass index (BMI) 36.0-36.9, adult: Secondary | ICD-10-CM | POA: Diagnosis not present

## 2020-12-22 DIAGNOSIS — I482 Chronic atrial fibrillation, unspecified: Secondary | ICD-10-CM | POA: Diagnosis not present

## 2020-12-22 DIAGNOSIS — Z299 Encounter for prophylactic measures, unspecified: Secondary | ICD-10-CM | POA: Diagnosis not present

## 2020-12-22 DIAGNOSIS — I10 Essential (primary) hypertension: Secondary | ICD-10-CM | POA: Diagnosis not present

## 2020-12-31 DIAGNOSIS — Z20822 Contact with and (suspected) exposure to covid-19: Secondary | ICD-10-CM | POA: Diagnosis not present

## 2021-01-01 DIAGNOSIS — L820 Inflamed seborrheic keratosis: Secondary | ICD-10-CM | POA: Diagnosis not present

## 2021-01-01 DIAGNOSIS — D485 Neoplasm of uncertain behavior of skin: Secondary | ICD-10-CM | POA: Diagnosis not present

## 2021-01-01 DIAGNOSIS — L570 Actinic keratosis: Secondary | ICD-10-CM | POA: Diagnosis not present

## 2021-01-05 DIAGNOSIS — R5383 Other fatigue: Secondary | ICD-10-CM | POA: Diagnosis not present

## 2021-01-05 DIAGNOSIS — Z299 Encounter for prophylactic measures, unspecified: Secondary | ICD-10-CM | POA: Diagnosis not present

## 2021-01-05 DIAGNOSIS — F419 Anxiety disorder, unspecified: Secondary | ICD-10-CM | POA: Diagnosis not present

## 2021-01-05 DIAGNOSIS — I5030 Unspecified diastolic (congestive) heart failure: Secondary | ICD-10-CM | POA: Diagnosis not present

## 2021-01-05 DIAGNOSIS — F339 Major depressive disorder, recurrent, unspecified: Secondary | ICD-10-CM | POA: Diagnosis not present

## 2021-01-05 DIAGNOSIS — I10 Essential (primary) hypertension: Secondary | ICD-10-CM | POA: Diagnosis not present

## 2021-01-20 DIAGNOSIS — Z299 Encounter for prophylactic measures, unspecified: Secondary | ICD-10-CM | POA: Diagnosis not present

## 2021-01-20 DIAGNOSIS — I10 Essential (primary) hypertension: Secondary | ICD-10-CM | POA: Diagnosis not present

## 2021-01-20 DIAGNOSIS — K219 Gastro-esophageal reflux disease without esophagitis: Secondary | ICD-10-CM | POA: Diagnosis not present

## 2021-01-20 DIAGNOSIS — I5030 Unspecified diastolic (congestive) heart failure: Secondary | ICD-10-CM | POA: Diagnosis not present

## 2021-01-20 DIAGNOSIS — I482 Chronic atrial fibrillation, unspecified: Secondary | ICD-10-CM | POA: Diagnosis not present

## 2021-01-20 DIAGNOSIS — Z6835 Body mass index (BMI) 35.0-35.9, adult: Secondary | ICD-10-CM | POA: Diagnosis not present

## 2021-02-03 DIAGNOSIS — I739 Peripheral vascular disease, unspecified: Secondary | ICD-10-CM | POA: Diagnosis not present

## 2021-03-03 NOTE — Progress Notes (Signed)
Cardiology Office Note  Date: 03/04/2021   ID: Nicholas [name], DOB 1928-11-15, MRN [id number]  PCP:  [name], MD  Cardiologist:  [name], MD Electrophysiologist:  None   Chief Complaint  Patient presents with   Cardiac follow-up    History of Present Illness: Nicholas [name] is an 87 y.o. male last seen in March.  He is here for a routine visit.  Since last encounter he was switched from Coumadin to Eliquis following discussion with PCP [name] and also his provider at the [facility] hospital system.  He was placed on Eliquis 2.5 mg twice daily, although based on his age, weight, and recent renal function he should be on 5 mg twice daily.  He does not report any spontaneous bleeding problems.  Follow-up echocardiogram in March revealed LVEF 55 to 60%, normal RV contraction and estimated RVSP, mild mitral regurgitation, mild aortic regurgitation with mildly dilated aortic root.  Heart rate remains well controlled today, I reviewed his medications and he continues on Lopressor.  Past Medical History:  Diagnosis Date   Atrial fibrillation ([place])    Cervical spondylosis without myelopathy    COPD (chronic obstructive pulmonary disease) ([place])    Diverticulosis of colon (without mention of hemorrhage)    Esophageal reflux    Essential hypertension    Malignant neoplasm of prostate ([place])    Obstructive sleep apnea    Osteoarthritis     Past Surgical History:  Procedure Laterality Date   CHOLECYSTECTOMY N/A 07/03/2014   Procedure: LAPAROSCOPIC CHOLECYSTECTOMY;  Surgeon: [name], MD;  Location: [facility] ORS;  Service: General;  Laterality: N/A;   COLONOSCOPY     COLONOSCOPY N/A 12/18/2015   Procedure: COLONOSCOPY;  Surgeon: [name], MD;  Location: [facility];  Service: Endoscopy;  Laterality: N/A;  1200   ESOPHAGEAL DILATION N/A 08/04/2016   Procedure: ESOPHAGEAL DILATION;  Surgeon: [name], MD;  Location: [facility];  Service: Endoscopy;  Laterality: N/A;    ESOPHAGOGASTRODUODENOSCOPY N/A 08/04/2016   Procedure: ESOPHAGOGASTRODUODENOSCOPY (EGD);  Surgeon: [name], MD;  Location: [facility];  Service: Endoscopy;  Laterality: N/A;  12:00   ESOPHAGOGASTRODUODENOSCOPY (EGD) WITH ESOPHAGEAL DILATION N/A 06/18/2013   Procedure: ESOPHAGOGASTRODUODENOSCOPY (EGD) WITH ESOPHAGEAL DILATION;  Surgeon: [name], MD;  [facility]: [facility];  Service: Endoscopy;  Laterality: N/A;  220-rescheduled to 730 Ann to notify pt   [place]     2000, 2005.     Current Outpatient Medications  Medication Sig Dispense Refill   albuterol-ipratropium (COMBIVENT) 18-103 MCG/ACT inhaler Inhale 2 puffs into the lungs every 6 (six) hours as needed for wheezing or shortness of breath (uses rarely). UAD PRN     allopurinol (ZYLOPRIM) 100 MG tablet Take 100 mg by mouth daily.     apixaban (ELIQUIS) 5 MG TABS tablet Take 1 tablet (5 mg total) by mouth 2 (two) times daily. 60 tablet 6   Cholecalciferol (VITAMIN D3) 1000 UNITS tablet Take 1,000 Units by mouth daily.     metoprolol tartrate (LOPRESSOR) 25 MG tablet Take 1 tablet by mouth 2 (two) times daily.      Multiple Vitamins-Minerals (PRESERVISION AREDS) TABS Take by mouth 2 (two) times daily.     ondansetron (ZOFRAN) 4 MG tablet Take 1 tablet (4 mg total) by mouth every 8 (eight) hours as needed for nausea or vomiting. 30 tablet 2   RABEprazole (ACIPHEX) 20 MG tablet  Take 20 mg by mouth daily.     sildenafil (VIAGRA) 100 MG tablet Take 1 tablet (100 mg total) by mouth daily as needed for erectile dysfunction. 15 tablet 3   venlafaxine XR (EFFEXOR-XR) 37.5 MG 24 hr capsule Take 37.5 mg by mouth daily.     VIAGRA 100 MG tablet Take 1 tablet (100 mg total) by mouth daily as needed for erectile dysfunction. 16 tablet 3   ANORO ELLIPTA 62.5-25 MCG/INH AEPB Inhale 1 puff into the lungs daily. (Patient not taking: Reported on 03/04/2021)     No current facility-administered  medications for this visit.   Allergies:  Sulfonamide derivatives   ROS: Chronic fatigue and leg weakness, uses a cane.  Physical Exam: VS:  BP 128/74   Pulse 74   Ht '5\' 9"'$  (1.753 m)   Wt 227 lb (103 kg)   SpO2 99%   BMI 33.52 kg/m , BMI Body mass index is 33.52 kg/m.  Wt Readings from Last 3 Encounters:  03/04/21 227 lb (103 kg)  08/26/20 234 lb (106.1 kg)  06/24/20 220 lb (99.8 kg)    General: Patient appears comfortable at rest. HEENT: Conjunctiva and lids normal, wearing a mask. Neck: Supple, no elevated JVP or carotid bruits, no thyromegaly. Lungs: Clear to auscultation, nonlabored breathing at rest. Cardiac: Irregularly irregular, no S3, 1/6 systolic murmur. Extremities: No pitting edema.  ECG:  An ECG dated 08/26/2020 was personally reviewed today and demonstrated:  Rate controlled atrial fibrillation with low voltage.  Recent Labwork:  May 2021: Cholesterol 155, triglycerides 106, HDL 59, LDL 75, hemoglobin 14.1, platelets 212, potassium 4.5, BUN 14, creatinine 0.91, AST 21, ALT 12, TSH 1.67 November 2021: Hemoglobin 13.4, platelets 229, cholesterol 157, triglycerides 68, HDL 58, LDL 86, BUN 13, creatinine 0.9, potassium 5.0, AST 29, ALT 8, TSH 1.39 July 2022: BUN 13, creatinine 0.84, potassium 4.9, Hgb 13.3, platelets 189  Other Studies Reviewed Today:  Echocardiogram 09/11/2020:  1. Left ventricular ejection fraction, by estimation, is 55 to 60%. The  left ventricle has normal function. The left ventricle has no regional  wall motion abnormalities. Left ventricular diastolic parameters are  indeterminate.   2. Right ventricular systolic function is normal. The right ventricular  size is normal. There is normal pulmonary artery systolic pressure. The  estimated right ventricular systolic pressure is 123XX123 mmHg.   3. Left atrial size was moderately dilated.   4. Right atrial size was severely dilated.   5. The mitral valve is grossly normal. Mild mitral valve  regurgitation.   6. The aortic valve is tricuspid. Aortic valve regurgitation is mild.  Aortic regurgitation PHT measures 635 msec.   7. Aortic dilatation noted. There is mild dilatation of the aortic root,  measuring 42 mm.   8. The inferior vena cava is normal in size with greater than 50%  respiratory variability, suggesting right atrial pressure of 3 mmHg.   Assessment and Plan:  1.  Permanent atrial fibrillation with CHA2DS2-VASc score of 3.  Agree with transition from Coumadin to Eliquis, although he should be on the 5 mg twice daily dose in consideration of age, weight, and recent renal function.  Prescription being updated and will fax copy of note to the hospital provider.  2.  Essential hypertension, blood pressure is well controlled today.  He did have a recent echocardiogram in March demonstrating normal LVEF at 55 to 60%.  3.  Mildly dilated aortic root, 42 mm by echocardiogram in March.  He is asymptomatic.  Focus on blood pressure control at this point.  Medication Adjustments/Labs and Tests Ordered: Current medicines are reviewed at length with the patient today.  Concerns regarding medicines are outlined above.   Tests Ordered: No orders of the defined types were placed in this encounter.   Medication Changes: Meds ordered this encounter  Medications   apixaban (ELIQUIS) 5 MG TABS tablet    Sig: Take 1 tablet (5 mg total) by mouth 2 (two) times daily.    Dispense:  60 tablet    Refill:  6    03/04/2021 dose increase     Disposition:  Follow up  6 months.  Signed, Satira Sark, MD, [facility] 03/04/2021 11:54 AM    [place] [facility] [place], [place], [place] [address] Phone: [phone]; Fax: [phone]

## 2021-03-04 ENCOUNTER — Encounter: Payer: Self-pay | Admitting: Cardiology

## 2021-03-04 ENCOUNTER — Ambulatory Visit (INDEPENDENT_AMBULATORY_CARE_PROVIDER_SITE_OTHER): Payer: Medicare Other | Admitting: Cardiology

## 2021-03-04 VITALS — BP 128/74 | HR 74 | Ht 69.0 in | Wt 227.0 lb

## 2021-03-04 DIAGNOSIS — I4821 Permanent atrial fibrillation: Secondary | ICD-10-CM

## 2021-03-04 DIAGNOSIS — I10 Essential (primary) hypertension: Secondary | ICD-10-CM

## 2021-03-04 MED ORDER — APIXABAN 5 MG PO TABS: 5.0000 mg | ORAL_TABLET | Freq: Two times a day (BID) | ORAL | 6 refills | Status: AC

## 2021-03-04 NOTE — Patient Instructions (Addendum)
Medication Instructions:  Your physician has recommended you make the following change in your medication:  Increase eliquis to 5 mg twice daily Continue other medications the same  Labwork: none  Testing/Procedures: none  Follow-Up: Your physician recommends that you schedule a follow-up appointment in: 6 months  Any Other Special Instructions Will Be Listed Below (If Applicable).  If you need a refill on your cardiac medications before your next appointment, please call your pharmacy.

## 2021-03-10 DIAGNOSIS — Z20822 Contact with and (suspected) exposure to covid-19: Secondary | ICD-10-CM | POA: Diagnosis not present

## 2021-04-14 DIAGNOSIS — Z961 Presence of intraocular lens: Secondary | ICD-10-CM | POA: Diagnosis not present

## 2021-04-14 DIAGNOSIS — H353132 Nonexudative age-related macular degeneration, bilateral, intermediate dry stage: Secondary | ICD-10-CM | POA: Diagnosis not present

## 2021-04-14 DIAGNOSIS — H524 Presbyopia: Secondary | ICD-10-CM | POA: Diagnosis not present

## 2021-04-16 DIAGNOSIS — I739 Peripheral vascular disease, unspecified: Secondary | ICD-10-CM | POA: Diagnosis not present

## 2021-05-07 DIAGNOSIS — Z1339 Encounter for screening examination for other mental health and behavioral disorders: Secondary | ICD-10-CM | POA: Diagnosis not present

## 2021-05-07 DIAGNOSIS — E7800 Pure hypercholesterolemia, unspecified: Secondary | ICD-10-CM | POA: Diagnosis not present

## 2021-05-07 DIAGNOSIS — Z79899 Other long term (current) drug therapy: Secondary | ICD-10-CM | POA: Diagnosis not present

## 2021-05-07 DIAGNOSIS — Z125 Encounter for screening for malignant neoplasm of prostate: Secondary | ICD-10-CM | POA: Diagnosis not present

## 2021-05-07 DIAGNOSIS — I10 Essential (primary) hypertension: Secondary | ICD-10-CM | POA: Diagnosis not present

## 2021-05-07 DIAGNOSIS — Z299 Encounter for prophylactic measures, unspecified: Secondary | ICD-10-CM | POA: Diagnosis not present

## 2021-05-07 DIAGNOSIS — I5030 Unspecified diastolic (congestive) heart failure: Secondary | ICD-10-CM | POA: Diagnosis not present

## 2021-05-07 DIAGNOSIS — Z1331 Encounter for screening for depression: Secondary | ICD-10-CM | POA: Diagnosis not present

## 2021-05-07 DIAGNOSIS — Z6835 Body mass index (BMI) 35.0-35.9, adult: Secondary | ICD-10-CM | POA: Diagnosis not present

## 2021-05-07 DIAGNOSIS — Z0000 Encounter for general adult medical examination without abnormal findings: Secondary | ICD-10-CM | POA: Diagnosis not present

## 2021-05-07 DIAGNOSIS — Z7189 Other specified counseling: Secondary | ICD-10-CM | POA: Diagnosis not present

## 2021-05-07 DIAGNOSIS — M109 Gout, unspecified: Secondary | ICD-10-CM | POA: Diagnosis not present

## 2021-05-07 DIAGNOSIS — R5383 Other fatigue: Secondary | ICD-10-CM | POA: Diagnosis not present

## 2021-05-08 ENCOUNTER — Other Ambulatory Visit: Payer: Self-pay

## 2021-05-11 DIAGNOSIS — I739 Peripheral vascular disease, unspecified: Secondary | ICD-10-CM | POA: Diagnosis not present

## 2021-05-11 DIAGNOSIS — I70211 Atherosclerosis of native arteries of extremities with intermittent claudication, right leg: Secondary | ICD-10-CM | POA: Diagnosis not present

## 2021-05-20 DIAGNOSIS — Z713 Dietary counseling and surveillance: Secondary | ICD-10-CM | POA: Diagnosis not present

## 2021-05-20 DIAGNOSIS — M109 Gout, unspecified: Secondary | ICD-10-CM | POA: Diagnosis not present

## 2021-05-20 DIAGNOSIS — Z6835 Body mass index (BMI) 35.0-35.9, adult: Secondary | ICD-10-CM | POA: Diagnosis not present

## 2021-05-20 DIAGNOSIS — Z299 Encounter for prophylactic measures, unspecified: Secondary | ICD-10-CM | POA: Diagnosis not present

## 2021-05-20 DIAGNOSIS — I10 Essential (primary) hypertension: Secondary | ICD-10-CM | POA: Diagnosis not present

## 2021-06-04 DIAGNOSIS — Z20822 Contact with and (suspected) exposure to covid-19: Secondary | ICD-10-CM | POA: Diagnosis not present

## 2021-06-05 DIAGNOSIS — H43813 Vitreous degeneration, bilateral: Secondary | ICD-10-CM | POA: Diagnosis not present

## 2021-06-05 DIAGNOSIS — H353112 Nonexudative age-related macular degeneration, right eye, intermediate dry stage: Secondary | ICD-10-CM | POA: Diagnosis not present

## 2021-06-05 DIAGNOSIS — H35372 Puckering of macula, left eye: Secondary | ICD-10-CM | POA: Diagnosis not present

## 2021-06-05 DIAGNOSIS — H35433 Paving stone degeneration of retina, bilateral: Secondary | ICD-10-CM | POA: Diagnosis not present

## 2021-06-05 DIAGNOSIS — H353123 Nonexudative age-related macular degeneration, left eye, advanced atrophic without subfoveal involvement: Secondary | ICD-10-CM | POA: Diagnosis not present

## 2021-06-05 DIAGNOSIS — H4321 Crystalline deposits in vitreous body, right eye: Secondary | ICD-10-CM | POA: Diagnosis not present

## 2021-06-22 NOTE — Progress Notes (Signed)
History of Present Illness: This man comes in today.  He has a history of prostate cancer.  Here for annual follow-up.  He also has a history of BPH.  He initially presented in March, 2013, already with a diagnosis of prostate cancer, established in July of 2011. At that time, he had an elevated PSA and underwent TRUS/Bx. 2/12 biopsies both the right side, revealed GS 3+3 pattern. The patient went to [facility] for consultation with Dr Brendia Sacks, who recommended robotic removal of his prostate, despite his age of 7. The patient requested a second opinion with the radiotherapy department there, and it was recommended that he be followed conservatively with active surveillance. He initially did that, with his PSA staying stable at between 6 and 7, until 5 March, 2013 when it went up to 9.5.  He then underwent repeat TRUS/Bx here in April, 2013. 7/12 cores came back positive for adenocarcinoma on that biopsy. 3/12 revealed a GS 3+3 pattern, 4/12 revealed a GS 3+4 pattern. It was recommended that he undergo radiotherapy (EBRT), which he completed in late September, 2013.  Most recent PSA was just over a year ago and was 0.3.  That represents a stable trend.  12.27.2022: PSA 0.3. No real GU c/o's. He is still sexually active.No blood in urine or stool.  He is still sexually active.  He requests refill of Viagra.  Past Medical History:  Diagnosis Date   Atrial fibrillation ([place])    Cervical spondylosis without myelopathy    COPD (chronic obstructive pulmonary disease) ([place])    Diverticulosis of colon (without mention of hemorrhage)    Esophageal reflux    Essential hypertension    Malignant neoplasm of prostate ([place])    Obstructive sleep apnea    Osteoarthritis     Past Surgical History:  Procedure Laterality Date   CHOLECYSTECTOMY N/A 07/03/2014   Procedure: LAPAROSCOPIC CHOLECYSTECTOMY;  Surgeon: [name], MD;  Location: [facility];  Service: General;  Laterality:  N/A;   COLONOSCOPY     COLONOSCOPY N/A 12/18/2015   Procedure: COLONOSCOPY;  Surgeon: [name], MD;  Location: [facility];  Service: Endoscopy;  Laterality: N/A;  1200   ESOPHAGEAL DILATION N/A 08/04/2016   Procedure: ESOPHAGEAL DILATION;  Surgeon: [name], MD;  Location: [facility];  Service: Endoscopy;  Laterality: N/A;   ESOPHAGOGASTRODUODENOSCOPY N/A 08/04/2016   Procedure: ESOPHAGOGASTRODUODENOSCOPY (EGD);  Surgeon: [name], MD;  [facility]: [facility];  Service: Endoscopy;  Laterality: N/A;  12:00   ESOPHAGOGASTRODUODENOSCOPY (EGD) WITH ESOPHAGEAL DILATION N/A 06/18/2013   Procedure: ESOPHAGOGASTRODUODENOSCOPY (EGD) WITH ESOPHAGEAL DILATION;  Surgeon: [name], MD;  [facility];  Service: Endoscopy;  Laterality: N/A;  220-rescheduled to 730 Ann to notify pt   [place]     2000, 2005.     Home Medications:  Allergies as of 06/23/2021       Reactions   Sulfonamide Derivatives Rash        Medication List        Accurate as of June 23, 2021  1:05 PM. If you have any questions, ask your nurse or doctor.          albuterol-ipratropium 18-103 MCG/ACT inhaler Commonly known as: COMBIVENT Inhale 2 puffs into the lungs every 6 (six) hours as needed for wheezing or shortness of breath (uses rarely). UAD PRN   allopurinol 100 MG tablet Commonly known as: ZYLOPRIM  Take 100 mg by mouth daily.   Anoro Ellipta 62.5-25 MCG/ACT Aepb Generic drug: umeclidinium-vilanterol Inhale 1 puff into the lungs daily.   apixaban 5 MG Tabs tablet Commonly known as: Eliquis Take 1 tablet (5 mg total) by mouth 2 (two) times daily.   cholecalciferol 25 MCG (1000 UNIT) tablet Commonly known as: VITAMIN D Take 1,000 Units by mouth daily.   metoprolol tartrate 25 MG tablet Commonly known as: LOPRESSOR Take 1 tablet by mouth 2 (two) times daily.   ondansetron 4 MG tablet Commonly known as: ZOFRAN Take 1  tablet (4 mg total) by mouth every 8 (eight) hours as needed for nausea or vomiting.   PreserVision AREDS Tabs Take by mouth 2 (two) times daily.   RABEprazole 20 MG tablet Commonly known as: ACIPHEX Take 20 mg by mouth daily.   venlafaxine XR 37.5 MG 24 hr capsule Commonly known as: EFFEXOR-XR Take 37.5 mg by mouth daily.   Viagra 100 MG tablet Generic drug: sildenafil Take 1 tablet (100 mg total) by mouth daily as needed for erectile dysfunction.   sildenafil 100 MG tablet Commonly known as: VIAGRA Take 1 tablet (100 mg total) by mouth daily as needed for erectile dysfunction.        Allergies:  Allergies  Allergen Reactions   Sulfonamide Derivatives Rash    Family History  Problem Relation Age of Onset   Hypertension Father    Heart attack Maternal Uncle     Social History:  reports that he quit smoking about 56 years ago. His smoking use included cigarettes. He started smoking about 74 years ago. He has a 18.00 pack-year smoking history. He quit smokeless tobacco use about 54 years ago.  His smokeless tobacco use included chew. He reports that he does not drink alcohol and does not use drugs.  ROS: A complete review of systems was performed.  All systems are negative except for pertinent findings as noted.  Physical Exam:  Vital signs in last 24 hours: There were no vitals taken for this visit. Constitutional:  Alert and oriented, No acute distress Cardiovascular: Regular rate  Respiratory: Normal respiratory effort GI: Abdomen is soft, nontender, nondistended, no abdominal masses. No CVAT.  Genitourinary: Normal male phallus, testes are descended bilaterally and non-tender and without masses, scrotum is normal in appearance without lesions or masses, perineum is normal on inspection.  Sphincter tone is somewhat decreased.  Prostate is nonpalpable. Lymphatic: No lymphadenopathy Neurologic: Grossly intact, no focal deficits Psychiatric: Normal mood and  affect  I have reviewed prior pt notes  I have reviewed notes from referring/previous physicians  I have reviewed urinalysis results  I have reviewed prior PSA/pathology results--PSA from September 0.2   Impression/Assessment:  Adenocarcinoma the prostate, treated.  Radiation completed about 9 years ago with excellent continued PSA response  2.  ED, still on sildenafil  3.  Urinary incontinence, most likely due to muscle weakness  Plan:  1.  I recommended that he start Kegel exercises  2.  Sildenafil refilled  3.  I will have him come back in 1 year for recheck

## 2021-06-23 ENCOUNTER — Other Ambulatory Visit: Payer: Self-pay

## 2021-06-23 ENCOUNTER — Encounter: Payer: Self-pay | Admitting: Urology

## 2021-06-23 ENCOUNTER — Ambulatory Visit (INDEPENDENT_AMBULATORY_CARE_PROVIDER_SITE_OTHER): Payer: Medicare Other | Admitting: Urology

## 2021-06-23 VITALS — BP 156/79 | HR 80

## 2021-06-23 DIAGNOSIS — N401 Enlarged prostate with lower urinary tract symptoms: Secondary | ICD-10-CM | POA: Diagnosis not present

## 2021-06-23 DIAGNOSIS — N138 Other obstructive and reflux uropathy: Secondary | ICD-10-CM

## 2021-06-23 DIAGNOSIS — N5201 Erectile dysfunction due to arterial insufficiency: Secondary | ICD-10-CM

## 2021-06-23 DIAGNOSIS — N393 Stress incontinence (female) (male): Secondary | ICD-10-CM | POA: Diagnosis not present

## 2021-06-23 DIAGNOSIS — Z8546 Personal history of malignant neoplasm of prostate: Secondary | ICD-10-CM | POA: Diagnosis not present

## 2021-06-23 LAB — URINALYSIS, ROUTINE W REFLEX MICROSCOPIC
Bilirubin, UA: NEGATIVE
Glucose, UA: NEGATIVE
Leukocytes,UA: NEGATIVE
Nitrite, UA: NEGATIVE
RBC, UA: NEGATIVE
Specific Gravity, UA: 1.025 (ref 1.005–1.030)
Urobilinogen, Ur: 0.2 mg/dL (ref 0.2–1.0)
pH, UA: 5.5 (ref 5.0–7.5)

## 2021-06-23 MED ORDER — VIAGRA 100 MG PO TABS: 100.0000 mg | ORAL_TABLET | Freq: Every day | ORAL | 3 refills | Status: DC | PRN

## 2021-06-23 NOTE — Addendum Note (Signed)
Addended by: Tyrone Apple on: 06/23/2021 03:34 PM   Modules accepted: Orders

## 2021-06-26 ENCOUNTER — Telehealth: Payer: Self-pay

## 2021-06-26 NOTE — Telephone Encounter (Signed)
[employer] no longer cover viagra.patient would have to pay 100% of cost.  Preferred Alternatives listed below Sildenafil tadalafil  Message sent to MD.

## 2021-06-30 NOTE — Telephone Encounter (Signed)
Notified patient. Patient would like to call his insurance company again and then decide.

## 2021-07-01 DIAGNOSIS — C44729 Squamous cell carcinoma of skin of left lower limb, including hip: Secondary | ICD-10-CM | POA: Diagnosis not present

## 2021-07-01 DIAGNOSIS — C44629 Squamous cell carcinoma of skin of left upper limb, including shoulder: Secondary | ICD-10-CM | POA: Diagnosis not present

## 2021-07-01 DIAGNOSIS — D485 Neoplasm of uncertain behavior of skin: Secondary | ICD-10-CM | POA: Diagnosis not present

## 2021-07-01 DIAGNOSIS — L820 Inflamed seborrheic keratosis: Secondary | ICD-10-CM | POA: Diagnosis not present

## 2021-07-01 DIAGNOSIS — L720 Epidermal cyst: Secondary | ICD-10-CM | POA: Diagnosis not present

## 2021-07-01 NOTE — Telephone Encounter (Signed)
Message sent to MD

## 2021-07-02 ENCOUNTER — Other Ambulatory Visit: Payer: Self-pay | Admitting: Urology

## 2021-07-02 DIAGNOSIS — I739 Peripheral vascular disease, unspecified: Secondary | ICD-10-CM | POA: Diagnosis not present

## 2021-07-02 DIAGNOSIS — N5201 Erectile dysfunction due to arterial insufficiency: Secondary | ICD-10-CM

## 2021-07-02 MED ORDER — SILDENAFIL CITRATE 100 MG PO TABS: 100.0000 mg | ORAL_TABLET | Freq: Every day | ORAL | 3 refills | Status: DC | PRN

## 2021-07-16 DIAGNOSIS — C44629 Squamous cell carcinoma of skin of left upper limb, including shoulder: Secondary | ICD-10-CM | POA: Diagnosis not present

## 2021-07-31 DIAGNOSIS — Z299 Encounter for prophylactic measures, unspecified: Secondary | ICD-10-CM | POA: Diagnosis not present

## 2021-07-31 DIAGNOSIS — I482 Chronic atrial fibrillation, unspecified: Secondary | ICD-10-CM | POA: Diagnosis not present

## 2021-07-31 DIAGNOSIS — U071 COVID-19: Secondary | ICD-10-CM | POA: Diagnosis not present

## 2021-07-31 DIAGNOSIS — M1990 Unspecified osteoarthritis, unspecified site: Secondary | ICD-10-CM | POA: Diagnosis not present

## 2021-07-31 DIAGNOSIS — I10 Essential (primary) hypertension: Secondary | ICD-10-CM | POA: Diagnosis not present

## 2021-08-07 DIAGNOSIS — Z299 Encounter for prophylactic measures, unspecified: Secondary | ICD-10-CM | POA: Diagnosis not present

## 2021-08-07 DIAGNOSIS — Z6835 Body mass index (BMI) 35.0-35.9, adult: Secondary | ICD-10-CM | POA: Diagnosis not present

## 2021-08-07 DIAGNOSIS — I10 Essential (primary) hypertension: Secondary | ICD-10-CM | POA: Diagnosis not present

## 2021-08-07 DIAGNOSIS — I739 Peripheral vascular disease, unspecified: Secondary | ICD-10-CM | POA: Diagnosis not present

## 2021-08-07 DIAGNOSIS — M109 Gout, unspecified: Secondary | ICD-10-CM | POA: Diagnosis not present

## 2021-08-07 DIAGNOSIS — D6869 Other thrombophilia: Secondary | ICD-10-CM | POA: Diagnosis not present

## 2021-08-24 DIAGNOSIS — Z20822 Contact with and (suspected) exposure to covid-19: Secondary | ICD-10-CM | POA: Diagnosis not present

## 2021-08-25 DIAGNOSIS — H43813 Vitreous degeneration, bilateral: Secondary | ICD-10-CM | POA: Diagnosis not present

## 2021-08-25 DIAGNOSIS — H353123 Nonexudative age-related macular degeneration, left eye, advanced atrophic without subfoveal involvement: Secondary | ICD-10-CM | POA: Diagnosis not present

## 2021-08-25 DIAGNOSIS — H353112 Nonexudative age-related macular degeneration, right eye, intermediate dry stage: Secondary | ICD-10-CM | POA: Diagnosis not present

## 2021-08-25 DIAGNOSIS — H43811 Vitreous degeneration, right eye: Secondary | ICD-10-CM | POA: Diagnosis not present

## 2021-08-25 DIAGNOSIS — H4321 Crystalline deposits in vitreous body, right eye: Secondary | ICD-10-CM | POA: Diagnosis not present

## 2021-08-25 DIAGNOSIS — H43812 Vitreous degeneration, left eye: Secondary | ICD-10-CM | POA: Diagnosis not present

## 2021-08-25 DIAGNOSIS — H3561 Retinal hemorrhage, right eye: Secondary | ICD-10-CM | POA: Diagnosis not present

## 2021-08-25 DIAGNOSIS — H43393 Other vitreous opacities, bilateral: Secondary | ICD-10-CM | POA: Diagnosis not present

## 2021-09-02 DIAGNOSIS — Z23 Encounter for immunization: Secondary | ICD-10-CM | POA: Diagnosis not present

## 2021-09-02 DIAGNOSIS — Z20828 Contact with and (suspected) exposure to other viral communicable diseases: Secondary | ICD-10-CM | POA: Diagnosis not present

## 2021-09-09 ENCOUNTER — Ambulatory Visit (INDEPENDENT_AMBULATORY_CARE_PROVIDER_SITE_OTHER): Payer: Medicare Other | Admitting: Cardiology

## 2021-09-09 ENCOUNTER — Encounter: Payer: Self-pay | Admitting: Cardiology

## 2021-09-09 VITALS — BP 118/80 | HR 56 | Ht 70.0 in | Wt 230.4 lb

## 2021-09-09 DIAGNOSIS — I10 Essential (primary) hypertension: Secondary | ICD-10-CM

## 2021-09-09 DIAGNOSIS — I4821 Permanent atrial fibrillation: Secondary | ICD-10-CM

## 2021-09-09 NOTE — Patient Instructions (Signed)

## 2021-09-09 NOTE — Progress Notes (Signed)
? ? ?Cardiology Office Note ? ?Date: 09/09/2021  ? ?ID: Nicholas [name], DOB August 17, 1928, MRN [id number] ? ?PCP:  Nicholas Blitz, MD  ?Cardiologist:  Nicholas Lesches, MD ?Electrophysiologist:  None  ? ?Chief Complaint  ?Patient presents with  ? Cardiac follow-up  ? ? ?History of Present Illness: ?Nicholas [name] is a 88 y.o. male last seen in September 2022.  He is here for a routine visit.  Reports no palpitations, no change in stamina or chronic shortness of breath with typical ADLs.  He does not report any falls or syncope. ? ?I reviewed his medications, he has been tolerating Eliquis, no spontaneous bleeding problems.  I reviewed his lab work from November 2022 as noted below.  I personally reviewed his ECG today which shows rate controlled atrial fibrillation. ? ?Past Medical History:  ?Diagnosis Date  ? Atrial fibrillation ([place])   ? Cervical spondylosis without myelopathy   ? COPD (chronic obstructive pulmonary disease) ([place])   ? Diverticulosis of colon (without mention of hemorrhage)   ? Esophageal reflux   ? Essential hypertension   ? Malignant neoplasm of prostate ([place])   ? Obstructive sleep apnea   ? Osteoarthritis   ? ? ?Past Surgical History:  ?Procedure Laterality Date  ? CHOLECYSTECTOMY N/A 07/03/2014  ? Procedure: LAPAROSCOPIC CHOLECYSTECTOMY;  Surgeon: Nicholas So, MD;  Location: [facility];  Service: General;  Laterality: N/A;  ? COLONOSCOPY    ? COLONOSCOPY N/A 12/18/2015  ? Procedure: COLONOSCOPY;  Surgeon: Nicholas Houston, MD;  [facility];  Service: Endoscopy;  Laterality: N/A;  1200  ? ESOPHAGEAL DILATION N/A 08/04/2016  ? Procedure: ESOPHAGEAL DILATION;  Surgeon: Nicholas Houston, MD;  [facility];  Service: Endoscopy;  Laterality: N/A;  ? ESOPHAGOGASTRODUODENOSCOPY N/A 08/04/2016  ? Procedure: ESOPHAGOGASTRODUODENOSCOPY (EGD);  Surgeon: Nicholas Houston, MD;  [facility];  Service: Endoscopy;  Laterality: N/A;  12:00  ? ESOPHAGOGASTRODUODENOSCOPY (EGD) WITH ESOPHAGEAL  DILATION N/A 06/18/2013  ? Procedure: ESOPHAGOGASTRODUODENOSCOPY (EGD) WITH ESOPHAGEAL DILATION;  Surgeon: Nicholas Houston, MD;  [facility];  Service: Endoscopy;  Laterality: N/A;  220-rescheduled to 730 Ann to notify pt  ? ROTATOR CUFF REPAIR    ? TOTAL KNEE ARTHROPLASTY    ? 2000, 2005.   ? ? ?Current Outpatient Medications  ?Medication Sig Dispense Refill  ? albuterol-ipratropium (COMBIVENT) 18-103 MCG/ACT inhaler Inhale 2 puffs into the lungs every 6 (six) hours as needed for wheezing or shortness of breath (uses rarely). UAD PRN    ? allopurinol (ZYLOPRIM) 100 MG tablet Take 100 mg by mouth daily.    ? apixaban (ELIQUIS) 5 MG TABS tablet Take 1 tablet (5 mg total) by mouth 2 (two) times daily. 60 tablet 6  ? Cholecalciferol (VITAMIN D3) 1000 UNITS tablet Take 1,000 Units by mouth daily.    ? metoprolol tartrate (LOPRESSOR) 25 MG tablet Take 1 tablet by mouth 2 (two) times daily.     ? Multiple Vitamins-Minerals (PRESERVISION AREDS) TABS Take by mouth 2 (two) times daily.    ? ondansetron (ZOFRAN) 4 MG tablet Take 1 tablet (4 mg total) by mouth every 8 (eight) hours as needed for nausea or vomiting. 30 tablet 2  ? RABEprazole (ACIPHEX) 20 MG tablet Take 20 mg by mouth daily.    ? sildenafil (VIAGRA) 100 MG tablet Take 1 tablet (100 mg total) by mouth daily as needed for erectile dysfunction. 15 tablet 3  ? venlafaxine XR (EFFEXOR-XR) 37.5 MG 24 hr  capsule Take 37.5 mg by mouth daily.    ? ?No current facility-administered medications for this visit.  ? ?Allergies:  Sulfonamide derivatives  ? ?ROS: No orthopnea or PND. ? ?Physical Exam: ?VS:  BP 118/80   Pulse (!) 56   Ht '5\' 10"'$  (1.778 m)   Wt 230 lb 6.4 oz (104.5 kg)   BMI 33.06 kg/m? , BMI Body mass index is 33.06 kg/m?. ? ?Wt Readings from Last 3 Encounters:  ?09/09/21 230 lb 6.4 oz (104.5 kg)  ?03/04/21 227 lb (103 kg)  ?08/26/20 234 lb (106.1 kg)  ?  ?General: Patient appears comfortable at rest. ?HEENT: Conjunctiva and lids normal, wearing  a mask. ?Neck: Supple, no elevated JVP or carotid bruits, no thyromegaly. ?Lungs: Clear to auscultation, nonlabored breathing at rest. ?Cardiac: Irregularly irregular, no S3, 1/6 systolic murmur, no pericardial rub. ?Extremities: No pitting edema. ? ?ECG:  An ECG dated 08/26/2020 was personally reviewed today and demonstrated:  Atrial fibrillation with low voltage. ? ?Recent Labwork: ? ?November 2022: Hemoglobin 13.5, platelets 219, BUN 15, creatinine 0.91, potassium 5.0, AST 30, ALT 7, cholesterol 134, triglycerides 64, HDL 52, LDL 69, TSH 2.39 ? ?Other Studies Reviewed Today: ? ?Echocardiogram 09/11/2020: ? 1. Left ventricular ejection fraction, by estimation, is 55 to 60%. The  ?left ventricle has normal function. The left ventricle has no regional  ?wall motion abnormalities. Left ventricular diastolic parameters are  ?indeterminate.  ? 2. Right ventricular systolic function is normal. The right ventricular  ?size is normal. There is normal pulmonary artery systolic pressure. The  ?estimated right ventricular systolic pressure is 70.4 mmHg.  ? 3. Left atrial size was moderately dilated.  ? 4. Right atrial size was severely dilated.  ? 5. The mitral valve is grossly normal. Mild mitral valve regurgitation.  ? 6. The aortic valve is tricuspid. Aortic valve regurgitation is mild.  ?Aortic regurgitation PHT measures 635 msec.  ? 7. Aortic dilatation noted. There is mild dilatation of the aortic root,  ?measuring 42 mm.  ? 8. The inferior vena cava is normal in size with greater than 50%  ?respiratory variability, suggesting right atrial pressure of 3 mmHg.  ? ?Assessment and Plan: ? ?1.  Permanent atrial fibrillation with CHA2DS2-VASc score of 3.  He is tolerating Eliquis for stroke prophylaxis and remains on Lopressor for heart rate control.  ECG reviewed.  I also reviewed his lab work from November 2022. ? ?2.  Essential hypertension, blood pressure is very well controlled today.  No changes made to current  regimen. ? ?Medication Adjustments/Labs and Tests Ordered: ?Current medicines are reviewed at length with the patient today.  Concerns regarding medicines are outlined above.  ? ?Tests Ordered: ?Orders Placed This Encounter  ?Procedures  ? EKG 12-Lead  ? ? ?Medication Changes: ?No orders of the defined types were placed in this encounter. ? ? ?Disposition:  Follow up  6 months. ? ?Signed, ?Satira Sark, MD, [facility] ?09/09/2021 11:59 AM    ?[place] at [facility] ?[address], [place], [place] [address] ?Phone: [phone]; Fax: [phone]  ?

## 2021-09-10 DIAGNOSIS — I739 Peripheral vascular disease, unspecified: Secondary | ICD-10-CM | POA: Diagnosis not present

## 2021-09-29 DIAGNOSIS — Z20822 Contact with and (suspected) exposure to covid-19: Secondary | ICD-10-CM | POA: Diagnosis not present

## 2021-10-01 DIAGNOSIS — Z20822 Contact with and (suspected) exposure to covid-19: Secondary | ICD-10-CM | POA: Diagnosis not present

## 2021-10-06 DIAGNOSIS — Z23 Encounter for immunization: Secondary | ICD-10-CM | POA: Diagnosis not present

## 2021-10-12 DIAGNOSIS — Z20822 Contact with and (suspected) exposure to covid-19: Secondary | ICD-10-CM | POA: Diagnosis not present

## 2021-10-20 DIAGNOSIS — Z20822 Contact with and (suspected) exposure to covid-19: Secondary | ICD-10-CM | POA: Diagnosis not present

## 2021-11-02 DIAGNOSIS — Z23 Encounter for immunization: Secondary | ICD-10-CM | POA: Diagnosis not present

## 2021-11-02 DIAGNOSIS — Z20822 Contact with and (suspected) exposure to covid-19: Secondary | ICD-10-CM | POA: Diagnosis not present

## 2021-11-06 DIAGNOSIS — I5030 Unspecified diastolic (congestive) heart failure: Secondary | ICD-10-CM | POA: Diagnosis not present

## 2021-11-06 DIAGNOSIS — I10 Essential (primary) hypertension: Secondary | ICD-10-CM | POA: Diagnosis not present

## 2021-11-06 DIAGNOSIS — Z6836 Body mass index (BMI) 36.0-36.9, adult: Secondary | ICD-10-CM | POA: Diagnosis not present

## 2021-11-06 DIAGNOSIS — I482 Chronic atrial fibrillation, unspecified: Secondary | ICD-10-CM | POA: Diagnosis not present

## 2021-11-06 DIAGNOSIS — Z299 Encounter for prophylactic measures, unspecified: Secondary | ICD-10-CM | POA: Diagnosis not present

## 2021-11-26 DIAGNOSIS — I739 Peripheral vascular disease, unspecified: Secondary | ICD-10-CM | POA: Diagnosis not present

## 2021-12-01 DIAGNOSIS — H353112 Nonexudative age-related macular degeneration, right eye, intermediate dry stage: Secondary | ICD-10-CM | POA: Diagnosis not present

## 2022-01-06 DIAGNOSIS — D485 Neoplasm of uncertain behavior of skin: Secondary | ICD-10-CM | POA: Diagnosis not present

## 2022-01-06 DIAGNOSIS — C44311 Basal cell carcinoma of skin of nose: Secondary | ICD-10-CM | POA: Diagnosis not present

## 2022-01-06 DIAGNOSIS — L570 Actinic keratosis: Secondary | ICD-10-CM | POA: Diagnosis not present

## 2022-01-14 DIAGNOSIS — C44311 Basal cell carcinoma of skin of nose: Secondary | ICD-10-CM | POA: Diagnosis not present

## 2022-02-02 DIAGNOSIS — S8001XD Contusion of right knee, subsequent encounter: Secondary | ICD-10-CM | POA: Diagnosis not present

## 2022-02-02 DIAGNOSIS — S8002XD Contusion of left knee, subsequent encounter: Secondary | ICD-10-CM | POA: Diagnosis not present

## 2022-02-23 DIAGNOSIS — I739 Peripheral vascular disease, unspecified: Secondary | ICD-10-CM | POA: Diagnosis not present

## 2022-03-10 DIAGNOSIS — Z299 Encounter for prophylactic measures, unspecified: Secondary | ICD-10-CM | POA: Diagnosis not present

## 2022-03-10 DIAGNOSIS — Z6836 Body mass index (BMI) 36.0-36.9, adult: Secondary | ICD-10-CM | POA: Diagnosis not present

## 2022-03-10 DIAGNOSIS — I10 Essential (primary) hypertension: Secondary | ICD-10-CM | POA: Diagnosis not present

## 2022-03-10 DIAGNOSIS — Z23 Encounter for immunization: Secondary | ICD-10-CM | POA: Diagnosis not present

## 2022-03-10 DIAGNOSIS — G4700 Insomnia, unspecified: Secondary | ICD-10-CM | POA: Diagnosis not present

## 2022-03-10 DIAGNOSIS — I482 Chronic atrial fibrillation, unspecified: Secondary | ICD-10-CM | POA: Diagnosis not present

## 2022-03-15 NOTE — Progress Notes (Unsigned)
Cardiology Office Note  Date: 03/16/2022   ID: Nicholas [name], DOB Oct 26, 1928, MRN [id number]  PCP:  Nicholas Blitz, MD  Cardiologist:  [name], MD Electrophysiologist:  None   Chief Complaint  Patient presents with   Cardiac follow-up    History of Present Illness: Nicholas [name] is a 88 y.o. male last seen in March.  He is here for a routine visit.  Reports no major change in his stamina, does have difficulty sleeping and feels fatigued at times as before.  No sense of palpitations or chest pain however.  Still lives in his own home and functional with ADLs and yard work.  He tries to stay as active as he can, but has to pace himself.  His 2 grandsons still check on him daily.  I did review his interval lab work done to the [facility] system and noted below.  We reviewed his medications which are stable.  Past Medical History:  Diagnosis Date   Atrial fibrillation ([place])    Cervical spondylosis without myelopathy    COPD (chronic obstructive pulmonary disease) ([place])    Diverticulosis of colon (without mention of hemorrhage)    Esophageal reflux    Essential hypertension    Malignant neoplasm of prostate ([place])    Obstructive sleep apnea    Osteoarthritis     Past Surgical History:  Procedure Laterality Date   CHOLECYSTECTOMY N/A 07/03/2014   Procedure: LAPAROSCOPIC CHOLECYSTECTOMY;  Surgeon: [name], MD;  Location: [facility];  Service: General;  Laterality: N/A;   COLONOSCOPY     COLONOSCOPY N/A 12/18/2015   Procedure: COLONOSCOPY;  Surgeon: [name], MD;  [facility];  Service: Endoscopy;  Laterality: N/A;  1200   ESOPHAGEAL DILATION N/A 08/04/2016   Procedure: ESOPHAGEAL DILATION;  Surgeon: [name], MD;  [facility];  Service: Endoscopy;  Laterality: N/A;   ESOPHAGOGASTRODUODENOSCOPY N/A 08/04/2016   Procedure: ESOPHAGOGASTRODUODENOSCOPY (EGD);  Surgeon: [name], MD;  [facility];  Service: Endoscopy;   Laterality: N/A;  12:00   ESOPHAGOGASTRODUODENOSCOPY (EGD) WITH ESOPHAGEAL DILATION N/A 06/18/2013   Procedure: ESOPHAGOGASTRODUODENOSCOPY (EGD) WITH ESOPHAGEAL DILATION;  Surgeon: [name], MD;  [facility];  Service: Endoscopy;  Laterality: N/A;  220-rescheduled to 730 Ann to notify pt   [place]     2000, 2005.     Current Outpatient Medications  Medication Sig Dispense Refill   albuterol-ipratropium (COMBIVENT) 18-103 MCG/ACT inhaler Inhale 2 puffs into the lungs every 6 (six) hours as needed for wheezing or shortness of breath (uses rarely). UAD PRN     allopurinol (ZYLOPRIM) 100 MG tablet Take 100 mg by mouth daily.     apixaban (ELIQUIS) 5 MG TABS tablet Take 1 tablet (5 mg total) by mouth 2 (two) times daily. 60 tablet 6   Cholecalciferol (VITAMIN D3) 1000 UNITS tablet Take 1,000 Units by mouth daily.     metoprolol tartrate (LOPRESSOR) 25 MG tablet Take 1 tablet by mouth 2 (two) times daily.      Multiple Vitamins-Minerals (PRESERVISION AREDS) TABS Take by mouth 2 (two) times daily.     ondansetron (ZOFRAN) 4 MG tablet Take 1 tablet (4 mg total) by mouth every 8 (eight) hours as needed for nausea or vomiting. 30 tablet 2   RABEprazole (ACIPHEX) 20 MG tablet Take 20 mg by mouth daily.     sildenafil (VIAGRA) 100 MG tablet  Take 1 tablet (100 mg total) by mouth daily as needed for erectile dysfunction. 15 tablet 3   venlafaxine XR (EFFEXOR-XR) 37.5 MG 24 hr capsule Take 37.5 mg by mouth daily.     No current facility-administered medications for this visit.   Allergies:  Sulfonamide derivatives   ROS: No palpitations or syncope.  Physical Exam: VS:  BP 136/78   Pulse 75   Ht '5\' 9"'$  (1.753 m)   Wt 230 lb 12.8 oz (104.7 kg)   SpO2 98%   BMI 34.08 kg/m , BMI Body mass index is 34.08 kg/m.  Wt Readings from Last 3 Encounters:  03/16/22 230 lb 12.8 oz (104.7 kg)  09/09/21 230 lb 6.4 oz (104.5 kg)  03/04/21 227 lb (103  kg)    General: Patient appears comfortable at rest. HEENT: Conjunctiva and lids normal. Neck: Supple, no elevated JVP or carotid bruits, no thyromegaly. Lungs: Clear to auscultation, nonlabored breathing at rest. Cardiac: Irregularly irregular, no S3, 1/6 systolic murmur, no pericardial rub. Extremities: No pitting edema.  ECG:  An ECG dated 09/09/2021 was personally reviewed today and demonstrated:  Rate controlled atrial fibrillation.  Recent Labwork:  November 2022: Hemoglobin 13.5, platelets 219, BUN 15, creatinine 0.91, potassium 5.0, AST 30, ALT 7, cholesterol 134, triglycerides 64, HDL 52, LDL 69, TSH 2.39 June 2023: Cholesterol 151, triglycerides 101, HDL 61, LDL 70, hemoglobin A1c 5.6%, hemoglobin 13.9, platelets 186, potassium 4.6, BUN 15, creatinine 1.04, AST 23, ALT 12  Other Studies Reviewed Today:  Echocardiogram 09/11/2020:  1. Left ventricular ejection fraction, by estimation, is 55 to 60%. The  left ventricle has normal function. The left ventricle has no regional  wall motion abnormalities. Left ventricular diastolic parameters are  indeterminate.   2. Right ventricular systolic function is normal. The right ventricular  size is normal. There is normal pulmonary artery systolic pressure. The  estimated right ventricular systolic pressure is 32.4 mmHg.   3. Left atrial size was moderately dilated.   4. Right atrial size was severely dilated.   5. The mitral valve is grossly normal. Mild mitral valve regurgitation.   6. The aortic valve is tricuspid. Aortic valve regurgitation is mild.  Aortic regurgitation PHT measures 635 msec.   7. Aortic dilatation noted. There is mild dilatation of the aortic root,  measuring 42 mm.   8. The inferior vena cava is normal in size with greater than 50%  respiratory variability, suggesting right atrial pressure of 3 mmHg.   Assessment and Plan:  1.  Permanent atrial fibrillation with CHA2DS2-VASc score of 3.  He is  symptomatically stable with good heart rate control today.  I reviewed his interval lab work.  No spontaneous bleeding problems on Eliquis.  Continue current dose of metoprolol.  2.  Essential hypertension, blood pressure in reasonable range today.  No changes were made to current medications.  Medication Adjustments/Labs and Tests Ordered: Current medicines are reviewed at length with the patient today.  Concerns regarding medicines are outlined above.   Tests Ordered: No orders of the defined types were placed in this encounter.   Medication Changes: No orders of the defined types were placed in this encounter.   Disposition:  Follow up  6 months.  Signed, Satira Sark, MD, [facility] 03/16/2022 11:34 AM    [place] at [place], [place], [place] [address] Phone: [phone]; Fax: [phone]

## 2022-03-16 ENCOUNTER — Ambulatory Visit: Payer: Medicare Other | Attending: Cardiology | Admitting: Cardiology

## 2022-03-16 ENCOUNTER — Encounter: Payer: Self-pay | Admitting: Cardiology

## 2022-03-16 VITALS — BP 136/78 | HR 75 | Ht 69.0 in | Wt 230.8 lb

## 2022-03-16 DIAGNOSIS — I10 Essential (primary) hypertension: Secondary | ICD-10-CM | POA: Diagnosis not present

## 2022-03-16 DIAGNOSIS — I4821 Permanent atrial fibrillation: Secondary | ICD-10-CM | POA: Diagnosis not present

## 2022-03-16 DIAGNOSIS — H43393 Other vitreous opacities, bilateral: Secondary | ICD-10-CM | POA: Diagnosis not present

## 2022-03-16 NOTE — Patient Instructions (Signed)

## 2022-04-04 DIAGNOSIS — Z7901 Long term (current) use of anticoagulants: Secondary | ICD-10-CM | POA: Diagnosis not present

## 2022-04-04 DIAGNOSIS — Z79899 Other long term (current) drug therapy: Secondary | ICD-10-CM | POA: Diagnosis not present

## 2022-04-04 DIAGNOSIS — M791 Myalgia, unspecified site: Secondary | ICD-10-CM | POA: Diagnosis not present

## 2022-04-04 DIAGNOSIS — W1839XA Other fall on same level, initial encounter: Secondary | ICD-10-CM | POA: Diagnosis not present

## 2022-04-04 DIAGNOSIS — I4891 Unspecified atrial fibrillation: Secondary | ICD-10-CM | POA: Diagnosis not present

## 2022-04-04 DIAGNOSIS — I10 Essential (primary) hypertension: Secondary | ICD-10-CM | POA: Diagnosis not present

## 2022-04-04 DIAGNOSIS — Z87891 Personal history of nicotine dependence: Secondary | ICD-10-CM | POA: Diagnosis not present

## 2022-04-04 DIAGNOSIS — R1012 Left upper quadrant pain: Secondary | ICD-10-CM | POA: Diagnosis not present

## 2022-04-04 DIAGNOSIS — R0789 Other chest pain: Secondary | ICD-10-CM | POA: Diagnosis not present

## 2022-04-04 DIAGNOSIS — S2242XA Multiple fractures of ribs, left side, initial encounter for closed fracture: Secondary | ICD-10-CM | POA: Diagnosis not present

## 2022-04-08 DIAGNOSIS — Z299 Encounter for prophylactic measures, unspecified: Secondary | ICD-10-CM | POA: Diagnosis not present

## 2022-04-08 DIAGNOSIS — I482 Chronic atrial fibrillation, unspecified: Secondary | ICD-10-CM | POA: Diagnosis not present

## 2022-04-08 DIAGNOSIS — Z87891 Personal history of nicotine dependence: Secondary | ICD-10-CM | POA: Diagnosis not present

## 2022-04-08 DIAGNOSIS — M1990 Unspecified osteoarthritis, unspecified site: Secondary | ICD-10-CM | POA: Diagnosis not present

## 2022-04-08 DIAGNOSIS — Z6836 Body mass index (BMI) 36.0-36.9, adult: Secondary | ICD-10-CM | POA: Diagnosis not present

## 2022-04-08 DIAGNOSIS — I10 Essential (primary) hypertension: Secondary | ICD-10-CM | POA: Diagnosis not present

## 2022-04-08 DIAGNOSIS — G4733 Obstructive sleep apnea (adult) (pediatric): Secondary | ICD-10-CM | POA: Diagnosis not present

## 2022-04-21 DIAGNOSIS — H353113 Nonexudative age-related macular degeneration, right eye, advanced atrophic without subfoveal involvement: Secondary | ICD-10-CM | POA: Diagnosis not present

## 2022-04-21 DIAGNOSIS — H353124 Nonexudative age-related macular degeneration, left eye, advanced atrophic with subfoveal involvement: Secondary | ICD-10-CM | POA: Diagnosis not present

## 2022-05-10 DIAGNOSIS — Z7189 Other specified counseling: Secondary | ICD-10-CM | POA: Diagnosis not present

## 2022-05-10 DIAGNOSIS — R5383 Other fatigue: Secondary | ICD-10-CM | POA: Diagnosis not present

## 2022-05-10 DIAGNOSIS — Z125 Encounter for screening for malignant neoplasm of prostate: Secondary | ICD-10-CM | POA: Diagnosis not present

## 2022-05-10 DIAGNOSIS — Z1339 Encounter for screening examination for other mental health and behavioral disorders: Secondary | ICD-10-CM | POA: Diagnosis not present

## 2022-05-10 DIAGNOSIS — Z0000 Encounter for general adult medical examination without abnormal findings: Secondary | ICD-10-CM | POA: Diagnosis not present

## 2022-05-10 DIAGNOSIS — F339 Major depressive disorder, recurrent, unspecified: Secondary | ICD-10-CM | POA: Diagnosis not present

## 2022-05-10 DIAGNOSIS — Z79899 Other long term (current) drug therapy: Secondary | ICD-10-CM | POA: Diagnosis not present

## 2022-05-10 DIAGNOSIS — R202 Paresthesia of skin: Secondary | ICD-10-CM | POA: Diagnosis not present

## 2022-05-10 DIAGNOSIS — I10 Essential (primary) hypertension: Secondary | ICD-10-CM | POA: Diagnosis not present

## 2022-05-10 DIAGNOSIS — Z299 Encounter for prophylactic measures, unspecified: Secondary | ICD-10-CM | POA: Diagnosis not present

## 2022-05-10 DIAGNOSIS — Z1331 Encounter for screening for depression: Secondary | ICD-10-CM | POA: Diagnosis not present

## 2022-05-10 DIAGNOSIS — Z6835 Body mass index (BMI) 35.0-35.9, adult: Secondary | ICD-10-CM | POA: Diagnosis not present

## 2022-05-12 ENCOUNTER — Other Ambulatory Visit: Payer: Self-pay | Admitting: Urology

## 2022-05-12 DIAGNOSIS — N5201 Erectile dysfunction due to arterial insufficiency: Secondary | ICD-10-CM

## 2022-05-18 DIAGNOSIS — I739 Peripheral vascular disease, unspecified: Secondary | ICD-10-CM | POA: Diagnosis not present

## 2022-05-28 DIAGNOSIS — Z6835 Body mass index (BMI) 35.0-35.9, adult: Secondary | ICD-10-CM | POA: Diagnosis not present

## 2022-05-28 DIAGNOSIS — R7989 Other specified abnormal findings of blood chemistry: Secondary | ICD-10-CM | POA: Diagnosis not present

## 2022-05-28 DIAGNOSIS — Z299 Encounter for prophylactic measures, unspecified: Secondary | ICD-10-CM | POA: Diagnosis not present

## 2022-05-28 DIAGNOSIS — Z713 Dietary counseling and surveillance: Secondary | ICD-10-CM | POA: Diagnosis not present

## 2022-06-22 ENCOUNTER — Ambulatory Visit: Payer: Medicare Other | Admitting: Urology

## 2022-06-29 ENCOUNTER — Ambulatory Visit: Payer: Medicare Other | Admitting: Urology

## 2022-07-05 DIAGNOSIS — D485 Neoplasm of uncertain behavior of skin: Secondary | ICD-10-CM | POA: Diagnosis not present

## 2022-07-05 DIAGNOSIS — D0462 Carcinoma in situ of skin of left upper limb, including shoulder: Secondary | ICD-10-CM | POA: Diagnosis not present

## 2022-07-05 DIAGNOSIS — L570 Actinic keratosis: Secondary | ICD-10-CM | POA: Diagnosis not present

## 2022-07-06 ENCOUNTER — Ambulatory Visit: Payer: Medicare Other | Admitting: Urology

## 2022-07-06 NOTE — Progress Notes (Incomplete)
History of Present Illness: Nicholas [name] is here for follow-up of history of prostate cancer.   He also has a history of BPH.  He initially presented in March, 2013, already with a diagnosis of prostate cancer, established in July of 2011. At that time, he had an elevated PSA and underwent TRUS/Bx. 2/12 biopsies both the right side, revealed GS 3+3 pattern. The patient went to [facility] for consultation with Dr Brendia Sacks, who recommended robotic removal of his prostate, despite his age of 73. The patient requested a second opinion with the radiotherapy department there, and it was recommended that he be followed conservatively with active surveillance. He initially did that, with his PSA staying stable at between 6 and 7, until 5 March, 2013 when it went up to 9.5.  He then underwent repeat TRUS/Bx here in April, 2013. 7/12 cores came back positive for adenocarcinoma on that biopsy. 3/12 revealed a GS 3+3 pattern, 4/12 revealed a GS 3+4 pattern. It was recommended that he undergo radiotherapy (EBRT), which he completed in late September, 2013.   Past Medical History:  Diagnosis Date   Atrial fibrillation ([place])    Cervical spondylosis without myelopathy    COPD (chronic obstructive pulmonary disease) ([place])    Diverticulosis of colon (without mention of hemorrhage)    Esophageal reflux    Essential hypertension    Malignant neoplasm of prostate ([place])    Obstructive sleep apnea    Osteoarthritis     Past Surgical History:  Procedure Laterality Date   CHOLECYSTECTOMY N/A 07/03/2014   Procedure: LAPAROSCOPIC CHOLECYSTECTOMY;  Surgeon: [name], MD;  Location: [facility];  Service: General;  Laterality: N/A;   COLONOSCOPY     COLONOSCOPY N/A 12/18/2015   Procedure: COLONOSCOPY;  Surgeon: [name], MD;  Location: [facility];  Service: Endoscopy;  Laterality: N/A;  1200   ESOPHAGEAL DILATION N/A 08/04/2016   Procedure: ESOPHAGEAL DILATION;  Surgeon: [name],  MD;  Location: [facility];  Service: Endoscopy;  Laterality: N/A;   ESOPHAGOGASTRODUODENOSCOPY N/A 08/04/2016   Procedure: ESOPHAGOGASTRODUODENOSCOPY (EGD);  Surgeon: [name], MD;  [facility];  Service: Endoscopy;  Laterality: N/A;  12:00   ESOPHAGOGASTRODUODENOSCOPY (EGD) WITH ESOPHAGEAL DILATION N/A 06/18/2013   Procedure: ESOPHAGOGASTRODUODENOSCOPY (EGD) WITH ESOPHAGEAL DILATION;  Surgeon: [name], MD;  [facility];  Service: Endoscopy;  Laterality: N/A;  220-rescheduled to 730 Ann to notify pt   [place]     2000, 2005.     Home Medications:  Allergies as of 07/06/2022       Reactions   Sulfonamide Derivatives Rash        Medication List        Accurate as of July 06, 2022  8:26 AM. If you have any questions, ask your nurse or doctor.          albuterol-ipratropium 18-103 MCG/ACT inhaler Commonly known as: COMBIVENT Inhale 2 puffs into the lungs every 6 (six) hours as needed for wheezing or shortness of breath (uses rarely). UAD PRN   allopurinol 100 MG tablet Commonly known as: ZYLOPRIM Take 100 mg by mouth daily.   apixaban 5 MG Tabs tablet Commonly known as: Eliquis Take 1 tablet (5 mg total) by mouth 2 (two) times daily.   cholecalciferol 25 MCG (1000 UNIT) tablet Commonly known as: VITAMIN D3 Take 1,000 Units by mouth daily.   metoprolol tartrate 25 MG tablet  Commonly known as: LOPRESSOR Take 1 tablet by mouth 2 (two) times daily.   ondansetron 4 MG tablet Commonly known as: ZOFRAN Take 1 tablet (4 mg total) by mouth every 8 (eight) hours as needed for nausea or vomiting.   PreserVision AREDS Tabs Take by mouth 2 (two) times daily.   RABEprazole 20 MG tablet Commonly known as: ACIPHEX Take 20 mg by mouth daily.   sildenafil 100 MG tablet Commonly known as: VIAGRA TAKE 1 TABLET DAILY AS NEEDED FOR ERECTILE DYSFUNCTION   venlafaxine XR 37.5 MG 24 hr capsule Commonly  known as: EFFEXOR-XR Take 37.5 mg by mouth daily.        Allergies:  Allergies  Allergen Reactions   Sulfonamide Derivatives Rash    Family History  Problem Relation Age of Onset   Hypertension Father    Heart attack Maternal Uncle     Social History:  reports that he quit smoking about 57 years ago. His smoking use included cigarettes. He started smoking about 75 years ago. He has a 18.00 pack-year smoking history. He quit smokeless tobacco use about 55 years ago.  His smokeless tobacco use included chew. He reports that he does not drink alcohol and does not use drugs.  ROS: A complete review of systems was performed.  All systems are negative except for pertinent findings as noted.  Physical Exam:  Vital signs in last 24 hours: There were no vitals taken for this visit. Constitutional:  Alert and oriented, No acute distress Cardiovascular: Regular rate  Respiratory: Normal respiratory effort GI: Abdomen is soft, nontender, nondistended, no abdominal masses. No CVAT.  Genitourinary: Normal male phallus, testes are descended bilaterally and non-tender and without masses, scrotum is normal in appearance without lesions or masses, perineum is normal on inspection. Lymphatic: No lymphadenopathy Neurologic: Grossly intact, no focal deficits Psychiatric: Normal mood and affect  I have reviewed prior pt notes  I have reviewed urinalysis results  I have independently reviewed prior imaging  I have reviewed prior PSA results   Impression/Assessment:  ***  Plan:  ***

## 2022-07-08 DIAGNOSIS — C44629 Squamous cell carcinoma of skin of left upper limb, including shoulder: Secondary | ICD-10-CM | POA: Diagnosis not present

## 2022-07-15 DIAGNOSIS — H353124 Nonexudative age-related macular degeneration, left eye, advanced atrophic with subfoveal involvement: Secondary | ICD-10-CM | POA: Diagnosis not present

## 2022-07-26 NOTE — Progress Notes (Signed)
History of Present Illness:   Nicholas [name] has a history of prostate cancer.  Here for annual follow-up.  He also has a history of BPH.  He initially presented in March, 2013, already with a diagnosis of prostate cancer, established in July of 2011. At that time, he had an elevated PSA and underwent TRUS/Bx. 2/12 biopsies both the right side, revealed GS 3+3 pattern. The patient went to [facility] for consultation with Dr Brendia Sacks, who recommended robotic removal of his prostate, despite his age of 67. The patient requested a second opinion with the radiotherapy department there, and it was recommended that he be followed conservatively with active surveillance. He initially did that, with his PSA staying stable at between 6 and 7, until 5 March, 2013 when it went up to 9.5.  He then underwent repeat TRUS/Bx here in April, 2013. 7/12 cores came back positive for adenocarcinoma on that biopsy. 3/12 revealed a GS 3+3 pattern, 4/12 revealed a GS 3+4 pattern. It was recommended that he undergo radiotherapy (EBRT), which he completed in late September, 2013.   1.30.2024: Recent PSA 0.2, T level 119.  Past Medical History:  Diagnosis Date   Atrial fibrillation ([place])    Cervical spondylosis without myelopathy    COPD (chronic obstructive pulmonary disease) ([place])    Diverticulosis of colon (without mention of hemorrhage)    Esophageal reflux    Essential hypertension    Malignant neoplasm of prostate ([place])    Obstructive sleep apnea    Osteoarthritis     Past Surgical History:  Procedure Laterality Date   CHOLECYSTECTOMY N/A 07/03/2014   Procedure: LAPAROSCOPIC CHOLECYSTECTOMY;  Surgeon: [name], MD;  Location: [facility];  Service: General;  Laterality: N/A;   COLONOSCOPY     COLONOSCOPY N/A 12/18/2015   Procedure: COLONOSCOPY;  Surgeon: [name], MD;  [facility];  Service: Endoscopy;  Laterality: N/A;  1200   ESOPHAGEAL DILATION N/A 08/04/2016    Procedure: ESOPHAGEAL DILATION;  Surgeon: [name], MD;  [facility];  Service: Endoscopy;  Laterality: N/A;   ESOPHAGOGASTRODUODENOSCOPY N/A 08/04/2016   Procedure: ESOPHAGOGASTRODUODENOSCOPY (EGD);  Surgeon: [name], MD;  [facility];  Service: Endoscopy;  Laterality: N/A;  12:00   ESOPHAGOGASTRODUODENOSCOPY (EGD) WITH ESOPHAGEAL DILATION N/A 06/18/2013   Procedure: ESOPHAGOGASTRODUODENOSCOPY (EGD) WITH ESOPHAGEAL DILATION;  Surgeon: [name], MD;  [facility];  Service: Endoscopy;  Laterality: N/A;  220-rescheduled to 730 Ann to notify pt   [place]     2000, 2005.     Home Medications:  Allergies as of 07/27/2022       Reactions   Sulfonamide Derivatives Rash        Medication List        Accurate as of July 26, 2022  7:25 PM. If you have any questions, ask your nurse or doctor.          albuterol-ipratropium 18-103 MCG/ACT inhaler Commonly known as: COMBIVENT Inhale 2 puffs into the lungs every 6 (six) hours as needed for wheezing or shortness of breath (uses rarely). UAD PRN   allopurinol 100 MG tablet Commonly known as: ZYLOPRIM Take 100 mg by mouth daily.   apixaban 5 MG Tabs tablet Commonly known as: Eliquis Take 1 tablet (5 mg total) by mouth 2 (two) times daily.   cholecalciferol 25 MCG (1000 UNIT) tablet Commonly known as: VITAMIN D3 Take 1,000  Units by mouth daily.   metoprolol tartrate 25 MG tablet Commonly known as: LOPRESSOR Take 1 tablet by mouth 2 (two) times daily.   ondansetron 4 MG tablet Commonly known as: ZOFRAN Take 1 tablet (4 mg total) by mouth every 8 (eight) hours as needed for nausea or vomiting.   PreserVision AREDS Tabs Take by mouth 2 (two) times daily.   RABEprazole 20 MG tablet Commonly known as: ACIPHEX Take 20 mg by mouth daily.   sildenafil 100 MG tablet Commonly known as: VIAGRA TAKE 1 TABLET DAILY AS NEEDED FOR ERECTILE  DYSFUNCTION   venlafaxine XR 37.5 MG 24 hr capsule Commonly known as: EFFEXOR-XR Take 37.5 mg by mouth daily.        Allergies:  Allergies  Allergen Reactions   Sulfonamide Derivatives Rash    Family History  Problem Relation Age of Onset   Hypertension Father    Heart attack Maternal Uncle     Social History:  reports that he quit smoking about 57 years ago. His smoking use included cigarettes. He started smoking about 75 years ago. He has a 18.00 pack-year smoking history. He quit smokeless tobacco use about 55 years ago.  His smokeless tobacco use included chew. He reports that he does not drink alcohol and does not use drugs.  ROS: A complete review of systems was performed.  All systems are negative except for pertinent findings as noted.  Physical Exam:  Vital signs in last 24 hours: There were no vitals taken for this visit. Constitutional:  Alert and oriented, No acute distress Cardiovascular: Regular rate  Respiratory: Normal respiratory effort GI: Abdomen is soft, nontender, nondistended, no abdominal masses. No CVAT.  Genitourinary: Normal male phallus, testes are descended bilaterally and non-tender and without masses, scrotum is normal in appearance without lesions or masses, perineum is normal on inspection. Lymphatic: No lymphadenopathy Neurologic: Grossly intact, no focal deficits Psychiatric: Normal mood and affect  I have reviewed prior pt note  I have reviewed urinalysis results  I have independently reviewed prior imaging  I have reviewed prior PSA results    Impression/Assessment:  ***  Plan:  ***

## 2022-07-27 ENCOUNTER — Ambulatory Visit (INDEPENDENT_AMBULATORY_CARE_PROVIDER_SITE_OTHER): Payer: Medicare Other | Admitting: Urology

## 2022-07-27 VITALS — BP 121/81 | HR 81

## 2022-07-27 DIAGNOSIS — R7989 Other specified abnormal findings of blood chemistry: Secondary | ICD-10-CM | POA: Diagnosis not present

## 2022-07-27 DIAGNOSIS — I739 Peripheral vascular disease, unspecified: Secondary | ICD-10-CM | POA: Diagnosis not present

## 2022-07-27 DIAGNOSIS — Z8546 Personal history of malignant neoplasm of prostate: Secondary | ICD-10-CM

## 2022-07-27 DIAGNOSIS — N401 Enlarged prostate with lower urinary tract symptoms: Secondary | ICD-10-CM

## 2022-07-27 DIAGNOSIS — N138 Other obstructive and reflux uropathy: Secondary | ICD-10-CM

## 2022-07-27 DIAGNOSIS — N5201 Erectile dysfunction due to arterial insufficiency: Secondary | ICD-10-CM

## 2022-07-27 MED ORDER — TESTOSTERONE CYPIONATE 200 MG/ML IM SOLN: INTRAMUSCULAR | 1 refills | Status: DC

## 2022-07-28 LAB — MICROSCOPIC EXAMINATION: Bacteria, UA: NONE SEEN

## 2022-07-28 LAB — URINALYSIS, ROUTINE W REFLEX MICROSCOPIC
Bilirubin, UA: NEGATIVE
Glucose, UA: NEGATIVE
Leukocytes,UA: NEGATIVE
Nitrite, UA: NEGATIVE
RBC, UA: NEGATIVE
Specific Gravity, UA: 1.025 (ref 1.005–1.030)
Urobilinogen, Ur: 0.2 mg/dL (ref 0.2–1.0)
pH, UA: 5 (ref 5.0–7.5)

## 2022-08-10 ENCOUNTER — Ambulatory Visit (INDEPENDENT_AMBULATORY_CARE_PROVIDER_SITE_OTHER): Payer: Medicare Other | Admitting: Urology

## 2022-08-10 DIAGNOSIS — R7989 Other specified abnormal findings of blood chemistry: Secondary | ICD-10-CM

## 2022-08-10 DIAGNOSIS — E291 Testicular hypofunction: Secondary | ICD-10-CM | POA: Diagnosis not present

## 2022-08-10 MED ORDER — "BD DISP NEEDLES 18G X 1-1/2"" MISC": 0 refills | Status: DC

## 2022-08-10 NOTE — Progress Notes (Addendum)
Patient presents today for testosterone teaching.  Patient was able to self administer testosterone injection.

## 2022-08-10 NOTE — Patient Instructions (Signed)
   Instructions for disposing of "Sharps" Disposal of syringes and other sharp objects is monitored by the [employer], operational (EPA). It is important to dispose of them properly for your safety and for the safety of others.  The EPA promotes all recycling activities, and therefore encourages you to discard medical waste sharps in sturdy, non-recyclable containers, when possible.  Your stat or community environmental programs may have other requirements or suggestions for disposing of your medical waste.  You should contact your local EPA office for any information you may need.  What container should be used Place needles, syringes, lancets and other sharp objects in a hard plastic or metal container with a screw on or tightly secured lid.  Many containers found in the household will do, or you may purchase containers specifically designed for disposal of medical wast sharps.  If a recyclable container is used to dispose of medical waste sharps, make sure that you don't mix the container with other materials to be recycled.  Since the sharps impair a containers recyclability, a container holding your medical waste sharps properly belongs with the regular household trash.  You should label the container "Not for Recycling".  In addition, make sure your sharps container is made of non breakable material and has a lid that can be securely closed (screwed on or tightly secured).  Before discarding a container, be sure to reinforce the lid with heavy-duty tape.  Do not put sharpe objects in a container you plan to recycle or return to a store, and do not use glass or clear plastic containers (see additional information below).  Finally, make sure that you keep all containers with sharp objects out of the reach of children and pets.  Your home care provider may deliver a sharps container with your medical supplies.  If so place all needles, syringes and lancets in this container and notify the  company when the container is approximately 75% full.  Your home care provider will arrange for pickup of the container.  For your safety, do NOT bring your container to the hospital or [facility] for disposal.        Tips for minimizing injection pain- -inject medicine that is at room temperature -remove all air bubbles from the syringe before injection -wait until the topical alcohol has evaporated before injecting -keep muscles in the injection area relaxed -break through the skin quickly -don't change the direction of the needle as it goes in or comes out -do not reuse disposable needles

## 2022-09-02 DIAGNOSIS — H348312 Tributary (branch) retinal vein occlusion, right eye, stable: Secondary | ICD-10-CM | POA: Diagnosis not present

## 2022-09-16 ENCOUNTER — Encounter: Payer: Self-pay | Admitting: Cardiology

## 2022-09-16 ENCOUNTER — Ambulatory Visit: Payer: Medicare Other | Attending: Cardiology | Admitting: Cardiology

## 2022-09-16 VITALS — BP 134/82 | HR 85 | Ht 69.0 in | Wt 223.2 lb

## 2022-09-16 DIAGNOSIS — I4821 Permanent atrial fibrillation: Secondary | ICD-10-CM | POA: Diagnosis not present

## 2022-09-16 DIAGNOSIS — I10 Essential (primary) hypertension: Secondary | ICD-10-CM

## 2022-09-16 NOTE — Patient Instructions (Addendum)

## 2022-09-16 NOTE — Progress Notes (Signed)
    Cardiology Office Note  Date: 09/16/2022   ID: LILBERN SLACUM, DOB 1929-03-05, MRN [id number]  History of Present Illness: Nicholas [name] is a 89 y.o. male last seen in September 2023.  He is here for a follow-up visit.  No major change in status, specifically no sense of palpitations or change in stamina.  He still tries to stay as active as he can and rest when he has to.  He continues to follow with PCP and also urology.  I reviewed his medications.  No changes from a cardiac perspective.  He is on Eliquis for stroke prophylaxis, no spontaneous bleeding problems.  Also stable dose of Lopressor.  I reviewed his most recent lab work.  ECG today shows rate controlled atrial fibrillation.  Physical Exam: VS:  BP 134/82   Pulse 85   Ht 5\' 9"  (1.753 m)   Wt 223 lb 3.2 oz (101.2 kg)   SpO2 100%   BMI 32.96 kg/m , BMI Body mass index is 32.96 kg/m.  Wt Readings from Last 3 Encounters:  09/16/22 223 lb 3.2 oz (101.2 kg)  03/16/22 230 lb 12.8 oz (104.7 kg)  09/09/21 230 lb 6.4 oz (104.5 kg)    General: Patient appears comfortable at rest. HEENT: Conjunctiva and lids normal. Neck: Supple, no elevated JVP or carotid bruits. Lungs: Clear to auscultation, nonlabored breathing at rest. Cardiac: Irregular irregular, 1/6 stock murmur, no gallop. Extremities: No pitting edema.  ECG:  An ECG dated 09/09/2021 was personally reviewed today and demonstrated:  Atrial fibrillation.  Labwork:  November 2023: Hemoglobin 13.5, platelets 200, BUN 11, creatinine 0.91, potassium 5.0, AST 22, ALT 8, cholesterol 140, triglycerides 101, HDL 53, LDL 68, TSH 1.75 December 2023: Cholesterol 144, triglycerides 86, HDL 61, LDL 66, hemoglobin A1c 5.4%, hemoglobin 12.9, platelets 191, potassium 4.3, BUN 14, creatinine 1.02, AST 10, ALT 25, TSH 2.16  Other Studies Reviewed Today:  No interval cardiac testing for review today.  Assessment and Plan:  1.  Permanent atrial fibrillation with CHA2DS2-VASc  score of 3.  Plan to continue current dose of Lopressor with good heart rate control noted today at rest and by ECG.  No progressive palpitations.  He remains on Eliquis for stroke prophylaxis.  I reviewed his most recent lab work.  2.  Essential hypertension.  He continues to check this periodically at home.  Keep follow-up with PCP.  Disposition:  Follow up  6 months.  Signed, Satira Sark, M.D., F.A.C.C.

## 2022-09-30 ENCOUNTER — Telehealth: Payer: Self-pay

## 2022-09-30 ENCOUNTER — Other Ambulatory Visit: Payer: Self-pay

## 2022-09-30 DIAGNOSIS — Z8546 Personal history of malignant neoplasm of prostate: Secondary | ICD-10-CM

## 2022-09-30 NOTE — Telephone Encounter (Signed)
Patient requested lab work be faxed to [place] in [place] at [phone] and to cancel his lab apt in [place] since he lives in [place].  He will follow up with Dr. Diona Fanti as scheduled. Orders faxed.

## 2022-10-05 DIAGNOSIS — I739 Peripheral vascular disease, unspecified: Secondary | ICD-10-CM | POA: Diagnosis not present

## 2022-10-07 ENCOUNTER — Telehealth: Payer: Self-pay

## 2022-10-07 NOTE — Telephone Encounter (Signed)
Left a voice message 10-07-2022.  Patient needing a call back regarding a medication prescribed to him.

## 2022-10-07 NOTE — Telephone Encounter (Signed)
Patient states that when he [facility] the sig says to take 1.63ml every 2 weeks.  His previous testosterone was .75 every 2 weeks.  Can you please specify the correct sig.

## 2022-10-08 ENCOUNTER — Other Ambulatory Visit: Payer: Self-pay

## 2022-10-08 MED ORDER — "LUER LOCK SAFETY SYRINGES 22G X 1-1/2"" 3 ML MISC": 0 refills | Status: AC

## 2022-10-08 NOTE — Telephone Encounter (Signed)
Patient aware of MD response.  Pt requested syringes be sent to his pharmacy for his testosterone injections.  Rx sent to [facility] as requested.

## 2022-10-12 ENCOUNTER — Other Ambulatory Visit: Payer: Medicare Other

## 2022-10-12 DIAGNOSIS — Z125 Encounter for screening for malignant neoplasm of prostate: Secondary | ICD-10-CM | POA: Diagnosis not present

## 2022-10-12 DIAGNOSIS — R7989 Other specified abnormal findings of blood chemistry: Secondary | ICD-10-CM | POA: Diagnosis not present

## 2022-10-18 NOTE — Progress Notes (Signed)
History of Present Illness:   Nicholas [name] has a history of prostate cancer.  Here for annual follow-up.  He also has a history of BPH.  He initially presented in March, 2013, already with a diagnosis of prostate cancer, established in July of 2011. At that time, he had an elevated PSA and underwent TRUS/Bx. 2/12 biopsies both the right side, revealed GS 3+3 pattern. The patient went to [facility] for consultation with Dr Luane School, who recommended robotic removal of his prostate, despite his age of 15. The patient requested a second opinion with the radiotherapy department there, and it was recommended that he be followed conservatively with active surveillance. He initially did that, with his PSA staying stable at between 6 and 7, until 5 March, 2013 when it went up to 9.5.  He then underwent repeat TRUS/Bx here in April, 2013. 7/12 cores came back positive for adenocarcinoma on that biopsy. 3/12 revealed a GS 3+3 pattern, 4/12 revealed a GS 3+4 pattern. It was recommended that he undergo radiotherapy (EBRT), which he completed in late September, 2013.    1.30.2024: Recent PSA 0.2, T level 119.  He has no prostate cancer related issues.  With his low testosterone level which was drawn in the morning appropriately, he is having low energy level and easy fatigue.  He was started on testosterone replacement, 150 mg IM every other week.  4.23.2024: He has been injecting without difficulty.  Last injection was 14 days ago, on a Wednesday.  Apparently had labs drawn last week.  These have not been available to me yet.  Past Medical History:  Diagnosis Date   Atrial fibrillation ([facility])    Cervical spondylosis without myelopathy    COPD (chronic obstructive pulmonary disease) ([facility])    Diverticulosis of colon (without mention of hemorrhage)    Esophageal reflux    Essential hypertension    Malignant neoplasm of prostate ([facility])    Obstructive sleep apnea    Osteoarthritis      Past Surgical History:  Procedure Laterality Date   CHOLECYSTECTOMY N/A 07/03/2014   Procedure: LAPAROSCOPIC CHOLECYSTECTOMY;  Surgeon: Dalia Heading, MD;  Location: [facility];  Service: General;  Laterality: N/A;   COLONOSCOPY     COLONOSCOPY N/A 12/18/2015   Procedure: COLONOSCOPY;  Surgeon: Malissa Hippo, MD;  Location: [facility];  Service: Endoscopy;  Laterality: N/A;  1200   ESOPHAGEAL DILATION N/A 08/04/2016   Procedure: ESOPHAGEAL DILATION;  Surgeon: Malissa Hippo, MD;  Location: [facility];  Service: Endoscopy;  Laterality: N/A;   ESOPHAGOGASTRODUODENOSCOPY N/A 08/04/2016   Procedure: ESOPHAGOGASTRODUODENOSCOPY (EGD);  Surgeon: Malissa Hippo, MD;  [facility];  Service: Endoscopy;  Laterality: N/A;  12:00   ESOPHAGOGASTRODUODENOSCOPY (EGD) WITH ESOPHAGEAL DILATION N/A 06/18/2013   Procedure: ESOPHAGOGASTRODUODENOSCOPY (EGD) WITH ESOPHAGEAL DILATION;  Surgeon: Malissa Hippo, MD;  [facility];  Service: Endoscopy;  Laterality: N/A;  220-rescheduled to 730 Ann to notify pt   ROTATOR CUFF REPAIR     TOTAL KNEE ARTHROPLASTY     2000, 2005.     Home Medications:  Allergies as of 10/19/2022       Reactions   Sulfonamide Derivatives Rash        Medication List        Accurate as of October 18, 2022 11:18 AM. If you have any questions, ask your nurse or doctor.          albuterol-ipratropium 18-103 MCG/ACT inhaler Commonly known as:  COMBIVENT Inhale 2 puffs into the lungs every 6 (six) hours as needed for wheezing or shortness of breath (uses rarely). UAD PRN   allopurinol 100 MG tablet Commonly known as: ZYLOPRIM Take 100 mg by mouth daily.   apixaban 5 MG Tabs tablet Commonly known as: Eliquis Take 1 tablet (5 mg total) by mouth 2 (two) times daily.   BD Disp Needles 18G X 1-1/2" Misc Generic drug: NEEDLE (DISP) 18 G Use to draw up testosterone injection   cholecalciferol 25 MCG (1000 UNIT) tablet Commonly known as: VITAMIN  D3 Take 1,000 Units by mouth daily.   Luer Lock Safety Syringes 22G X 1-1/2" 3 ML Misc Generic drug: SYRINGE-NEEDLE (DISP) 3 ML Use to inject testosterone   metoprolol tartrate 25 MG tablet Commonly known as: LOPRESSOR Take 1 tablet by mouth 2 (two) times daily.   ondansetron 4 MG tablet Commonly known as: ZOFRAN Take 1 tablet (4 mg total) by mouth every 8 (eight) hours as needed for nausea or vomiting.   PreserVision AREDS Tabs Take by mouth 2 (two) times daily.   RABEprazole 20 MG tablet Commonly known as: ACIPHEX Take 20 mg by mouth daily.   sildenafil 100 MG tablet Commonly known as: VIAGRA TAKE 1 TABLET DAILY AS NEEDED FOR ERECTILE DYSFUNCTION   testosterone cypionate 200 MG/ML injection Commonly known as: DEPOTESTOSTERONE CYPIONATE Inject 0.75 mL into the muscle every other week   venlafaxine XR 37.5 MG 24 hr capsule Commonly known as: EFFEXOR-XR Take 37.5 mg by mouth daily.        Allergies:  Allergies  Allergen Reactions   Sulfonamide Derivatives Rash    Family History  Problem Relation Age of Onset   Hypertension Father    Heart attack Maternal Uncle     Social History:  reports that he quit smoking about 57 years ago. His smoking use included cigarettes. He started smoking about 75 years ago. He has a 18.00 pack-year smoking history. He quit smokeless tobacco use about 55 years ago.  His smokeless tobacco use included chew. He reports that he does not drink alcohol and does not use drugs.  ROS: A complete review of systems was performed.  All systems are negative except for pertinent findings as noted.  Physical Exam:  Vital signs in last 24 hours: There were no vitals taken for this visit. Constitutional:  Alert and oriented, No acute distress Cardiovascular: Regular rate  Respiratory: Normal respiratory effortl deficits Psychiatric: Normal mood and affect  I have reviewed prior pt notes  I have reviewed urinalysis results  I have  independently reviewed prior imaging  I have reviewed prior PSA and T results  Most recent PSA from 10/12/2022--0.6 (prior level 0.2) Testosterone, total 1 week after injection-[id number]    Impression/Assessment:  Low testosterone-on repletion 0.75 mL every other week.  Mid cycle testosterone level quite high He did have PSA go up to 0.6  Plan:  I have told him to inject 0.5 mL every other week  I will have him get a testosterone level at midcycle, on 15 May (injection on the eighth) and I will call him with results and further instructions  I also asked him that if he is going to have labs drawn in [place], to please have those delivered to [place] in person or sent.  We spent over 45 minutes of office time trying to get his lab results

## 2022-10-19 ENCOUNTER — Encounter: Payer: Self-pay | Admitting: Urology

## 2022-10-19 ENCOUNTER — Ambulatory Visit (INDEPENDENT_AMBULATORY_CARE_PROVIDER_SITE_OTHER): Payer: Medicare Other | Admitting: Urology

## 2022-10-19 VITALS — BP 142/78 | HR 92

## 2022-10-19 DIAGNOSIS — E291 Testicular hypofunction: Secondary | ICD-10-CM

## 2022-10-19 DIAGNOSIS — Z8546 Personal history of malignant neoplasm of prostate: Secondary | ICD-10-CM

## 2022-10-19 DIAGNOSIS — N401 Enlarged prostate with lower urinary tract symptoms: Secondary | ICD-10-CM | POA: Diagnosis not present

## 2022-10-19 DIAGNOSIS — N138 Other obstructive and reflux uropathy: Secondary | ICD-10-CM | POA: Diagnosis not present

## 2022-10-19 DIAGNOSIS — N5201 Erectile dysfunction due to arterial insufficiency: Secondary | ICD-10-CM | POA: Diagnosis not present

## 2022-10-19 DIAGNOSIS — R7989 Other specified abnormal findings of blood chemistry: Secondary | ICD-10-CM

## 2022-10-19 LAB — URINALYSIS, ROUTINE W REFLEX MICROSCOPIC
Bilirubin, UA: NEGATIVE
Glucose, UA: NEGATIVE
Ketones, UA: NEGATIVE
Leukocytes,UA: NEGATIVE
Nitrite, UA: NEGATIVE
RBC, UA: NEGATIVE
Specific Gravity, UA: 1.03 (ref 1.005–1.030)
Urobilinogen, Ur: 0.2 mg/dL (ref 0.2–1.0)
pH, UA: 5.5 (ref 5.0–7.5)

## 2022-11-09 DIAGNOSIS — Z125 Encounter for screening for malignant neoplasm of prostate: Secondary | ICD-10-CM | POA: Diagnosis not present

## 2022-11-09 DIAGNOSIS — R7989 Other specified abnormal findings of blood chemistry: Secondary | ICD-10-CM | POA: Diagnosis not present

## 2022-11-25 ENCOUNTER — Telehealth: Payer: Self-pay

## 2022-11-25 NOTE — Telephone Encounter (Signed)
Patient called in to check on lab works that he drop off. Patient is aware that labs were forward to Dr. Retta Diones and someone will reach out with MD recommendations. Patient voiced understanding

## 2022-11-30 NOTE — Telephone Encounter (Addendum)
Tried returning call patient with no answer. Left message for return call.

## 2022-11-30 NOTE — Telephone Encounter (Signed)
Patient calling to discuss recommendations.  Please advise.  Call around 4 pm today if possible.

## 2022-12-01 ENCOUNTER — Other Ambulatory Visit: Payer: Self-pay | Admitting: Urology

## 2022-12-01 NOTE — Telephone Encounter (Signed)
Patient is calling about lab work,  Testerone injection that is due today, and if he need to follow up with the Dr. Retta Diones. Patient is aware that a message will be sent to the MD on recommendation and some one will reach back out. Patient voiced understanding.

## 2022-12-03 ENCOUNTER — Telehealth: Payer: Self-pay

## 2022-12-03 DIAGNOSIS — N138 Other obstructive and reflux uropathy: Secondary | ICD-10-CM

## 2022-12-03 NOTE — Telephone Encounter (Signed)
-----   Message from Marcine Matar, MD sent at 12/03/2022  6:40 AM EDT ----- Please call pt--I have reviewed labs from Dr Sherryll Burger, both from April and May. He is not really having the right labs drawn each time. However, the April lab showed a higher testosterone level--it is adequate. The second lab drawn in May was only the free portion of testosterone--I normally get the total testosterone value instead. It was really low, though. Perhaps it was drawn right before his 2 week injection. I would prefer to draw it here at a time 1 week after his injection and the day of or day before--just the total testosterone value. Would he be able to come here for labs--perhaps in a month or so for both?

## 2022-12-06 NOTE — Telephone Encounter (Signed)
Patient is made aware and voiced understanding.

## 2022-12-06 NOTE — Addendum Note (Signed)
Addended by: Christoper Fabian R on: 12/06/2022 11:16 AM   Modules accepted: Orders

## 2022-12-20 ENCOUNTER — Telehealth: Payer: Self-pay

## 2022-12-20 NOTE — Telephone Encounter (Signed)
Return  call to patient about how much Testerone to inject. Patient states [facility] and [facility] states inject 1 ml every other week. Patient is made aware per Dr. Retta Diones last office note patient is to inject 0.5 ml every other week of Testerone. Patient voiced understanding.

## 2022-12-21 DIAGNOSIS — I739 Peripheral vascular disease, unspecified: Secondary | ICD-10-CM | POA: Diagnosis not present

## 2023-01-05 ENCOUNTER — Other Ambulatory Visit: Payer: Medicare Other

## 2023-01-05 DIAGNOSIS — N138 Other obstructive and reflux uropathy: Secondary | ICD-10-CM | POA: Diagnosis not present

## 2023-01-05 DIAGNOSIS — N401 Enlarged prostate with lower urinary tract symptoms: Secondary | ICD-10-CM | POA: Diagnosis not present

## 2023-01-06 ENCOUNTER — Other Ambulatory Visit: Payer: Medicare Other

## 2023-01-07 LAB — PSA: Prostate Specific Ag, Serum: 0.6 ng/mL (ref 0.0–4.0)

## 2023-01-07 LAB — TESTOSTERONE,FREE AND TOTAL
Testosterone, Free: 10.3 pg/mL (ref 6.6–18.1)
Testosterone: 644 ng/dL (ref 264–916)

## 2023-01-11 ENCOUNTER — Telehealth: Payer: Self-pay

## 2023-01-11 NOTE — Telephone Encounter (Signed)
Patient is made aware to follow up with Dr. Retta Diones "Return in 3 mos " Patient voiced understanding

## 2023-01-11 NOTE — Telephone Encounter (Signed)
-----   Message from Bertram Millard Dahlstedt sent at 01/11/2023  7:26 AM EDT ----- Notify ppt that T level is excellent @ 644, PSA still good @ 0.6. Cont same dose of T ----- Message ----- From: Troy Sine, CMA Sent: 01/07/2023   3:18 PM EDT To: Marcine Matar, MD  Please review

## 2023-01-11 NOTE — Telephone Encounter (Signed)
Patient is made aware of Dr. Retta Diones recommendation. Patient states he do not have any follow up appointment and would like to know when should he follow up with Dr. Retta Diones.   Patient is made aware of Testosterone injection at 0.5 ml every other week. Patient is made aware a message will be sent to Dr. Retta Diones regarding follow up appointment. Patient voiced understanding.

## 2023-01-12 DIAGNOSIS — L820 Inflamed seborrheic keratosis: Secondary | ICD-10-CM | POA: Diagnosis not present

## 2023-01-12 DIAGNOSIS — D485 Neoplasm of uncertain behavior of skin: Secondary | ICD-10-CM | POA: Diagnosis not present

## 2023-01-12 DIAGNOSIS — L570 Actinic keratosis: Secondary | ICD-10-CM | POA: Diagnosis not present

## 2023-03-07 DIAGNOSIS — H353124 Nonexudative age-related macular degeneration, left eye, advanced atrophic with subfoveal involvement: Secondary | ICD-10-CM | POA: Diagnosis not present

## 2023-03-08 DIAGNOSIS — I739 Peripheral vascular disease, unspecified: Secondary | ICD-10-CM | POA: Diagnosis not present

## 2023-03-17 DIAGNOSIS — H353124 Nonexudative age-related macular degeneration, left eye, advanced atrophic with subfoveal involvement: Secondary | ICD-10-CM | POA: Diagnosis not present

## 2023-03-17 DIAGNOSIS — H353113 Nonexudative age-related macular degeneration, right eye, advanced atrophic without subfoveal involvement: Secondary | ICD-10-CM | POA: Diagnosis not present

## 2023-03-17 DIAGNOSIS — H353211 Exudative age-related macular degeneration, right eye, with active choroidal neovascularization: Secondary | ICD-10-CM | POA: Diagnosis not present

## 2023-03-17 DIAGNOSIS — H43813 Vitreous degeneration, bilateral: Secondary | ICD-10-CM | POA: Diagnosis not present

## 2023-03-17 DIAGNOSIS — H43393 Other vitreous opacities, bilateral: Secondary | ICD-10-CM | POA: Diagnosis not present

## 2023-03-17 DIAGNOSIS — H35033 Hypertensive retinopathy, bilateral: Secondary | ICD-10-CM | POA: Diagnosis not present

## 2023-03-21 ENCOUNTER — Other Ambulatory Visit: Payer: Self-pay | Admitting: Urology

## 2023-03-21 ENCOUNTER — Telehealth: Payer: Self-pay

## 2023-03-21 DIAGNOSIS — R7989 Other specified abnormal findings of blood chemistry: Secondary | ICD-10-CM

## 2023-03-21 MED ORDER — TESTOSTERONE CYPIONATE 200 MG/ML IM SOLN: INTRAMUSCULAR | 1 refills | Status: DC

## 2023-03-21 NOTE — Telephone Encounter (Signed)
Patient called in today for a refill of his testosterone injection.

## 2023-03-22 ENCOUNTER — Encounter: Payer: Self-pay | Admitting: Urology

## 2023-03-22 ENCOUNTER — Ambulatory Visit: Payer: Medicare Other | Admitting: Urology

## 2023-03-22 ENCOUNTER — Other Ambulatory Visit: Payer: Self-pay | Admitting: Urology

## 2023-03-22 VITALS — BP 127/77 | HR 94 | Temp 98.5°F

## 2023-03-22 DIAGNOSIS — R310 Gross hematuria: Secondary | ICD-10-CM | POA: Diagnosis not present

## 2023-03-22 DIAGNOSIS — Z923 Personal history of irradiation: Secondary | ICD-10-CM | POA: Diagnosis not present

## 2023-03-22 DIAGNOSIS — C61 Malignant neoplasm of prostate: Secondary | ICD-10-CM | POA: Insufficient documentation

## 2023-03-22 DIAGNOSIS — N529 Male erectile dysfunction, unspecified: Secondary | ICD-10-CM | POA: Diagnosis not present

## 2023-03-22 DIAGNOSIS — Z8546 Personal history of malignant neoplasm of prostate: Secondary | ICD-10-CM

## 2023-03-22 DIAGNOSIS — R8289 Other abnormal findings on cytological and histological examination of urine: Secondary | ICD-10-CM | POA: Diagnosis not present

## 2023-03-22 DIAGNOSIS — E291 Testicular hypofunction: Secondary | ICD-10-CM

## 2023-03-22 DIAGNOSIS — Z87891 Personal history of nicotine dependence: Secondary | ICD-10-CM | POA: Insufficient documentation

## 2023-03-22 DIAGNOSIS — R7989 Other specified abnormal findings of blood chemistry: Secondary | ICD-10-CM

## 2023-03-22 LAB — URINALYSIS, ROUTINE W REFLEX MICROSCOPIC
Bilirubin, UA: NEGATIVE
Glucose, UA: NEGATIVE
Nitrite, UA: NEGATIVE
Specific Gravity, UA: 1.03 (ref 1.005–1.030)
Urobilinogen, Ur: 1 mg/dL (ref 0.2–1.0)
pH, UA: 5.5 (ref 5.0–7.5)

## 2023-03-22 LAB — MICROSCOPIC EXAMINATION: RBC, Urine: >30 /hpf — AB (ref 0–2)

## 2023-03-22 MED ORDER — TESTOSTERONE CYPIONATE 200 MG/ML IM SOLN: INTRAMUSCULAR | 1 refills | Status: DC

## 2023-03-22 NOTE — Progress Notes (Addendum)
Name: Nicholas [name] DOB: 1928/12/25 MRN: [id number]  History of Present Illness: [name] is a 89 y.o. male who presents today for follow up visit at [facility] [place]. - GU history: 1. Prostate cancer. - Underwent EBRT in late September 2013. 2. Hypogonadism. - Symptoms include low energy level and easy fatigue.   - Does injections (150 mg IM every other week) for testosterone replacement therapy.  3. Erectile dysfunction.  - Uses Viagra 100 mg PRN.   At last visit with Dr. Retta Diones on 10/19/2022: - "Low testosterone-on repletion 0.75 mL every other week.  Mid cycle testosterone level quite high He did have PSA go up to 0.6".  - The plan was: Inject 0.5 mL testosterone every other week. "Recheck testosterone level at midcycle, on 15 May (injection on the eighth) and I will call him with results and further instructions."  Since last visit: > 01/05/2023:  - Testosterone was within normal range (644)  - PSA was 0.6 (stable compared to prior)  Today: He reports that one day last week he noticed what sounds like a clot in his urinary stream and then this morning when he woke up there was "a patch of blood" in the front of his underwear. Denies gross hematuria. He denies prior history of gross hematuria. He denies any acute urinary changes - reports chronic urinary urgency & frequency; denies dysuria, straining to void, or sensations of incomplete emptying. He denies abdominal or flank pain. He denies fevers.  He denies history of kidney stones.  He denies history of recent or recurrent UTI. He reports distant history of smoking (quit 1967 after smoking 1  ppd x18 years). He denies any recent trauma or prolonged pressure to the perineal area. He denies recent illness. He reports taking anticoagulants (Eliquis).  PSA values: - 09/30/2015: PSA 0.4 - 05/03/2019: PSA 0.4 - 11/08/2019: PSA 0.524 - 05/06/2020: PSA 0.3 - 05/07/2021: PSA 0.2 - 05/10/2022: PSA 0.2 -  10/12/2022: PSA 0.6, free PSA 0.08 (13.3%) - 11/09/2022: PSA 0.5, free PSA 0.06 (12%)  Testosterone results: - 10/12/2022: Testosterone 1169 (high) - 11/09/2022: Testosterone 186 (low)   Fall Screening: Do you usually have a device to assist in your mobility? Yes - cane   Medications: Current Outpatient Medications  Medication Sig Dispense Refill   albuterol-ipratropium (COMBIVENT) 18-103 MCG/ACT inhaler Inhale 2 puffs into the lungs every 6 (six) hours as needed for wheezing or shortness of breath (uses rarely). UAD PRN     allopurinol (ZYLOPRIM) 100 MG tablet Take 100 mg by mouth daily.     apixaban (ELIQUIS) 5 MG TABS tablet Take 1 tablet (5 mg total) by mouth 2 (two) times daily. 60 tablet 6   BD DISP NEEDLES 18G X 1-1/2" MISC USE TO DRAW UP TESTOSTERONE INJECTION 6 each 0   Cholecalciferol (VITAMIN D3) 1000 UNITS tablet Take 1,000 Units by mouth daily.     metoprolol tartrate (LOPRESSOR) 25 MG tablet Take 1 tablet by mouth 2 (two) times daily.      Multiple Vitamins-Minerals (PRESERVISION AREDS) TABS Take by mouth 2 (two) times daily.     ondansetron (ZOFRAN) 4 MG tablet Take 1 tablet (4 mg total) by mouth every 8 (eight) hours as needed for nausea or vomiting. 30 tablet 2   RABEprazole (ACIPHEX) 20 MG tablet Take 20 mg by mouth daily.     sildenafil (VIAGRA) 100 MG tablet TAKE 1 TABLET DAILY AS NEEDED FOR ERECTILE DYSFUNCTION 15 tablet 7   SYRINGE-NEEDLE, DISP, 3  ML (LUER LOCK SAFETY SYRINGES) 22G X 1-1/2" 3 ML MISC Use to inject testosterone 6 each 0   testosterone cypionate (DEPOTESTOSTERONE CYPIONATE) 200 MG/ML injection Inject 0.75 mL into the muscle every other week 10 mL 1   venlafaxine XR (EFFEXOR-XR) 37.5 MG 24 hr capsule Take 37.5 mg by mouth daily.     No current facility-administered medications for this visit.    Allergies: Allergies  Allergen Reactions   Sulfonamide Derivatives Rash    Past Medical History:  Diagnosis Date   Atrial fibrillation (HCC)     Cervical spondylosis without myelopathy    COPD (chronic obstructive pulmonary disease) (HCC)    Diverticulosis of colon (without mention of hemorrhage)    Esophageal reflux    Essential hypertension    Malignant neoplasm of prostate (HCC)    Obstructive sleep apnea    Osteoarthritis    Past Surgical History:  Procedure Laterality Date   CHOLECYSTECTOMY N/A 07/03/2014   Procedure: LAPAROSCOPIC CHOLECYSTECTOMY;  Surgeon: Dalia Heading, MD;  Location: [facility];  Service: General;  Laterality: N/A;   COLONOSCOPY     COLONOSCOPY N/A 12/18/2015   Procedure: COLONOSCOPY;  Surgeon: Malissa Hippo, MD;  Location: [facility];  Service: Endoscopy;  Laterality: N/A;  1200   ESOPHAGEAL DILATION N/A 08/04/2016   Procedure: ESOPHAGEAL DILATION;  Surgeon: Malissa Hippo, MD;  Location: [facility];  Service: Endoscopy;  Laterality: N/A;   ESOPHAGOGASTRODUODENOSCOPY N/A 08/04/2016   Procedure: ESOPHAGOGASTRODUODENOSCOPY (EGD);  Surgeon: Malissa Hippo, MD;  Location: [facility];  Service: Endoscopy;  Laterality: N/A;  12:00   ESOPHAGOGASTRODUODENOSCOPY (EGD) WITH ESOPHAGEAL DILATION N/A 06/18/2013   Procedure: ESOPHAGOGASTRODUODENOSCOPY (EGD) WITH ESOPHAGEAL DILATION;  Surgeon: Malissa Hippo, MD;  Location: [facility];  Service: Endoscopy;  Laterality: N/A;  220-rescheduled to 730 Ann to notify pt   ROTATOR CUFF REPAIR     TOTAL KNEE ARTHROPLASTY     2000, 2005.    Family History  Problem Relation Age of Onset   Hypertension Father    Heart attack Maternal Uncle    Social History   Socioeconomic History   Marital status: Widowed    Spouse name: Not on file   Number of children: 1   Years of education: 50   Highest education level: Not on file  Occupational History   Occupation: Retired    Associate Professor: RETIRED  Tobacco Use   Smoking status: Former    Current packs/day: 0.00    Average packs/day: 1 pack/day for 18.1 years (18.1 ttl pk-yrs)    Types: Cigarettes    Start date:  05/10/1947    Quit date: 06/28/1965    Years since quitting: 57.7   Smokeless tobacco: Former    Types: Chew    Quit date: 06/29/1967  Vaping Use   Vaping status: Never Used  Substance and Sexual Activity   Alcohol use: No    Alcohol/week: 0.0 standard drinks of alcohol   Drug use: No   Sexual activity: Not on file  Other Topics Concern   Not on file  Social History Narrative   One story   Right handed   Lives alone   One son   Social Determinants of Health   Financial Resource Strain: Not on file  Food Insecurity: Not on file  Transportation Needs: Not on file  Physical Activity: Not on file  Stress: Not on file  Social Connections: Not on file  Intimate Partner Violence: Not on file  Review of Systems Constitutional: Patient denies any unintentional weight loss or change in strength lntegumentary: Patient denies any rashes or pruritus Cardiovascular: Patient denies chest pain or syncope Respiratory: Patient denies shortness of breath Gastrointestinal: Patient denies nausea, vomiting, constipation, or diarrhea Musculoskeletal: Patient denies muscle cramps or weakness Neurologic: Patient denies convulsions or seizures Psychiatric: Patient denies memory problems Allergic/Immunologic: Patient denies recent allergic reaction(s) Hematologic/Lymphatic: Patient denies bleeding tendencies Endocrine: Patient denies heat/cold intolerance  GU: As per HPI.  OBJECTIVE Vitals:   03/22/23 1156  BP: 127/77  Pulse: 94  Temp: 98.5 F (36.9 C)   There is no height or weight on file to calculate BMI.  Physical Examination Constitutional: No obvious distress; patient is non-toxic appearing  Cardiovascular: No visible lower extremity edema.  Respiratory: The patient does not have audible wheezing/stridor; respirations do not appear labored  Gastrointestinal: Abdomen non-distended Musculoskeletal: Normal ROM of UEs  Skin: No obvious rashes/open sores  Neurologic: CN 2-12  grossly intact Psychiatric: Answered questions appropriately with normal affect  Hematologic/Lymphatic/Immunologic: No obvious bruises or sites of spontaneous bleeding  UA: 0-5 WBC/hpf, >30 RBC/hpf, bacteria (few) PVR: 0 ml  ASSESSMENT Gross hematuria - Plan: Urinalysis, Routine w reflex microscopic, Cytology, urine, BLADDER SCAN AMB NON-IMAGING, CT HEMATURIA WORKUP, Basic metabolic panel  Prostate cancer (HCC) - Plan: CT HEMATURIA WORKUP, Basic metabolic panel  Status post radiation therapy - Underwent EBRT in late September 2013 for prostate cancer - Plan: CT HEMATURIA WORKUP, Basic metabolic panel  Hypogonadism in male  Erectile dysfunction, unspecified erectile dysfunction type  Former smoker - Plan: CT HEMATURIA WORKUP, Basic metabolic panel  For management of gross hematuria, we discussed possible etiologies including but not limited to: vigorous exercise, sexual activity, stone, trauma, blood thinner use, urinary tract infection, kidney function, BPH, prostatitis, radiation cystitis, malignancy. We discussed pt's smoking as a risk factor for GU cancer and encouraged continued smoking cessation.  Likely radiation cystitis, however due to cancer history advised cystoscopy for further evaluation, which can be done when he sees Dr. Retta Diones on 04/12/2023. Also advised CT urogram and voided cytology.  We discussed the risk for clot retention and pt was advised to increase fluid intake to thin out clots. Pt was advised to go to the ER if they become unable to urinate due to clot retention, start having symptoms of anemia (which were discussed), or any other significant concerning acute symptoms.  Pt verbalized understanding and decided to pursue this work-up. Patient agreed to follow-up afterward to discuss the results and formulate a treatment plan based on the findings.   Dr. Retta Diones was consulted to confirm current testosterone dose - patient instructed to take 0.75 ml every other  week. Refills being resent by Dr. Retta Diones to [facility] as required by the [facility] per patient request.  All questions were answered.  PLAN Advised the following: CT urogram ordered. Voided cytology ordered. BMP to check renal function prior to CT. Return in 3 weeks (on 04/12/2023) for f/u with Dr. Retta Diones as scheduled - change visit type to cystoscopy.   Continue testosterone replacement therapy as per Dr. Retta Diones.   Orders Placed This Encounter  Procedures   CT HEMATURIA WORKUP    Standing Status:   Future    Standing Expiration Date:   03/21/2024    Order Specific Question:   Reason for Exam (SYMPTOM  OR DIAGNOSIS REQUIRED)    Answer:   Gross hematuria    Order Specific Question:   Preferred imaging location?    Answer:  [facility]   Urinalysis, Routine w reflex microscopic   Basic metabolic panel   BLADDER SCAN AMB NON-IMAGING    It has been explained that the patient is to follow regularly with their PCP in addition to all other providers involved in their care and to follow instructions provided by these respective offices. Patient advised to contact urology clinic if any urologic-pertaining questions, concerns, new symptoms or problems arise in the interim period.  There are no Patient Instructions on file for this visit.  Electronically signed by:  Donnita Falls, FNP   03/22/23    12:37 PM

## 2023-03-22 NOTE — Telephone Encounter (Signed)
Patient is made aware. Patient states his prescription went to the wrong pharmacy and [facility] need [facility]. Patient voiced understanding

## 2023-03-22 NOTE — Addendum Note (Signed)
Addended byEvette Georges on: 03/22/2023 12:37 PM   Modules accepted: Orders

## 2023-03-23 NOTE — Telephone Encounter (Signed)
Patient is made aware.

## 2023-03-24 ENCOUNTER — Other Ambulatory Visit: Payer: Self-pay | Admitting: Urology

## 2023-03-24 ENCOUNTER — Telehealth: Payer: Self-pay

## 2023-03-24 DIAGNOSIS — Z87891 Personal history of nicotine dependence: Secondary | ICD-10-CM

## 2023-03-24 DIAGNOSIS — R310 Gross hematuria: Secondary | ICD-10-CM

## 2023-03-24 DIAGNOSIS — C61 Malignant neoplasm of prostate: Secondary | ICD-10-CM

## 2023-03-24 LAB — CYTOLOGY, URINE

## 2023-03-24 NOTE — Progress Notes (Signed)
Please let patient know that his voided cytology was inconclusive with atypical urothelial cells present. Please ask him to stop by the lab to provide another urine specimen to be sent for additional testing. He is also advised to follow up for cystoscopy and CT urogram as planned / previously discussed. Thanks.

## 2023-03-24 NOTE — Telephone Encounter (Addendum)
Patient is made aware "Please let patient know that his voided cytology was inconclusive with atypical urothelial cells present. Please ask him to stop by the lab to provide another urine specimen to be sent for additional testing. He is also advised to follow up for cystoscopy and CT urogram as planned / previously discussed. Thanks.  I just placed an order for [facility] test for this patient due to indeterminate voided cytology result... would you please look and see if I picked the right order?  also will he need to come back to provide another urine specimen for that." Patient voiced understanding.

## 2023-03-28 ENCOUNTER — Ambulatory Visit: Payer: Medicare Other

## 2023-03-28 DIAGNOSIS — B349 Viral infection, unspecified: Secondary | ICD-10-CM | POA: Diagnosis not present

## 2023-03-28 DIAGNOSIS — C61 Malignant neoplasm of prostate: Secondary | ICD-10-CM | POA: Diagnosis not present

## 2023-03-28 NOTE — Progress Notes (Signed)
Patient left 15 cc of specimen, FISH test requires at least 30.  Patient will come back by later this week to leave another specimen.

## 2023-03-29 ENCOUNTER — Telehealth: Payer: Self-pay

## 2023-03-29 NOTE — Telephone Encounter (Signed)
[facility] staff member states that there was nothing wrong with the prescription and the patient will get refilled in [place].

## 2023-03-31 DIAGNOSIS — H353211 Exudative age-related macular degeneration, right eye, with active choroidal neovascularization: Secondary | ICD-10-CM | POA: Diagnosis not present

## 2023-03-31 DIAGNOSIS — H353124 Nonexudative age-related macular degeneration, left eye, advanced atrophic with subfoveal involvement: Secondary | ICD-10-CM | POA: Diagnosis not present

## 2023-04-06 ENCOUNTER — Encounter (HOSPITAL_COMMUNITY): Payer: Self-pay | Admitting: Radiology

## 2023-04-06 ENCOUNTER — Ambulatory Visit (HOSPITAL_COMMUNITY)
Admission: RE | Admit: 2023-04-06 | Discharge: 2023-04-06 | Disposition: A | Discharge status: Home / Self Care | Payer: Medicare Other | Source: Ambulatory Visit | Attending: Urology | Admitting: Urology

## 2023-04-06 DIAGNOSIS — R310 Gross hematuria: Secondary | ICD-10-CM | POA: Insufficient documentation

## 2023-04-06 DIAGNOSIS — Z9049 Acquired absence of other specified parts of digestive tract: Secondary | ICD-10-CM | POA: Diagnosis not present

## 2023-04-06 DIAGNOSIS — C61 Malignant neoplasm of prostate: Secondary | ICD-10-CM | POA: Diagnosis not present

## 2023-04-06 DIAGNOSIS — Z923 Personal history of irradiation: Secondary | ICD-10-CM | POA: Insufficient documentation

## 2023-04-06 DIAGNOSIS — Z87891 Personal history of nicotine dependence: Secondary | ICD-10-CM | POA: Insufficient documentation

## 2023-04-06 DIAGNOSIS — K5730 Diverticulosis of large intestine without perforation or abscess without bleeding: Secondary | ICD-10-CM | POA: Diagnosis not present

## 2023-04-06 LAB — POCT I-STAT CREATININE: Creatinine, Ser: 1.2 mg/dL (ref 0.61–1.24)

## 2023-04-06 MED ORDER — IOHEXOL 300 MG/ML  SOLN
125.0000 mL | Freq: Once | INTRAMUSCULAR | Status: AC | PRN
  Administered 2023-04-06: 125 mL via INTRAVENOUS

## 2023-04-07 NOTE — Progress Notes (Signed)
History of Present Illness:   Nicholas [name] has a history of prostate cancer.  Here for annual follow-up.  He also has a history of BPH.  He initially presented in March, 2013, already with a diagnosis of prostate cancer, established in July of 2011. At that time, he had an elevated PSA and underwent TRUS/Bx. 2/12 biopsies both the right side, revealed GS 3+3 pattern. The patient went to [facility] for consultation with Dr Luane School, who recommended robotic removal of his prostate, despite his age of 63. The patient requested a second opinion with the radiotherapy department there, and it was recommended that he be followed conservatively with active surveillance. He initially did that, with his PSA staying stable at between 6 and 7, until 5 March, 2013 when it went up to 9.5.  He then underwent repeat TRUS/Bx here in April, 2013. 7/12 cores came back positive for adenocarcinoma on that biopsy. 3/12 revealed a GS 3+3 pattern, 4/12 revealed a GS 3+4 pattern. It was recommended that he undergo radiotherapy (EBRT), which he completed in late September, 2013.    1.30.2024: Recent PSA 0.2, T level 119.  He has no prostate cancer related issues.  With his low testosterone level which was drawn in the morning appropriately, he is having low energy level and easy fatigue.  He was started on testosterone replacement, 150 mg IM every other week.  10.15.2024: Here for possible cystoscopy.  Had 1 or 2 episodes of blood on his undershorts recently with microscopic hematuria.  He denies current gross hematuria.  No lower urinary tract symptoms.  He is not sure if the testosterone injections are making him feel any better.  Most recent PSA is 0.6, in July.  Past Medical History:  Diagnosis Date   Atrial fibrillation (HCC)    Cervical spondylosis without myelopathy    COPD (chronic obstructive pulmonary disease) (HCC)    Diverticulosis of colon (without mention of hemorrhage)     Esophageal reflux    Essential hypertension    Malignant neoplasm of prostate (HCC)    Obstructive sleep apnea    Osteoarthritis     Past Surgical History:  Procedure Laterality Date   CHOLECYSTECTOMY N/A 07/03/2014   Procedure: LAPAROSCOPIC CHOLECYSTECTOMY;  Surgeon: Dalia Heading, MD;  Location: [facility];  Service: General;  Laterality: N/A;   COLONOSCOPY     COLONOSCOPY N/A 12/18/2015   Procedure: COLONOSCOPY;  Surgeon: Malissa Hippo, MD;  [facility];  Service: Endoscopy;  Laterality: N/A;  1200   ESOPHAGEAL DILATION N/A 08/04/2016   Procedure: ESOPHAGEAL DILATION;  Surgeon: Malissa Hippo, MD;  [facility];  Service: Endoscopy;  Laterality: N/A;   ESOPHAGOGASTRODUODENOSCOPY N/A 08/04/2016   Procedure: ESOPHAGOGASTRODUODENOSCOPY (EGD);  Surgeon: Malissa Hippo, MD;  [facility];  Service: Endoscopy;  Laterality: N/A;  12:00   ESOPHAGOGASTRODUODENOSCOPY (EGD) WITH ESOPHAGEAL DILATION N/A 06/18/2013   Procedure: ESOPHAGOGASTRODUODENOSCOPY (EGD) WITH ESOPHAGEAL DILATION;  Surgeon: Malissa Hippo, MD;  [facility];  Service: Endoscopy;  Laterality: N/A;  220-rescheduled to 730 Ann to notify pt   ROTATOR CUFF REPAIR     TOTAL KNEE ARTHROPLASTY     2000, 2005.     Home Medications:  Allergies as of 04/12/2023       Reactions   Sulfonamide Derivatives Rash        Medication List        Accurate as of April 07, 2023 12:29 PM. If you have any  questions, ask your nurse or doctor.          albuterol-ipratropium 18-103 MCG/ACT inhaler Commonly known as: COMBIVENT Inhale 2 puffs into the lungs every 6 (six) hours as needed for wheezing or shortness of breath (uses rarely). UAD PRN   allopurinol 100 MG tablet Commonly known as: ZYLOPRIM Take 100 mg by mouth daily.   apixaban 5 MG Tabs tablet Commonly known as: Eliquis Take 1 tablet (5 mg total) by mouth 2 (two) times daily.   BD Disp Needles 18G X 1-1/2" Misc Generic drug:  NEEDLE (DISP) 18 G USE TO DRAW UP TESTOSTERONE INJECTION   cholecalciferol 25 MCG (1000 UNIT) tablet Commonly known as: VITAMIN D3 Take 1,000 Units by mouth daily.   Luer Lock Safety Syringes 22G X 1-1/2" 3 ML Misc Generic drug: SYRINGE-NEEDLE (DISP) 3 ML Use to inject testosterone   metoprolol tartrate 25 MG tablet Commonly known as: LOPRESSOR Take 1 tablet by mouth 2 (two) times daily.   ondansetron 4 MG tablet Commonly known as: ZOFRAN Take 1 tablet (4 mg total) by mouth every 8 (eight) hours as needed for nausea or vomiting.   PreserVision AREDS Tabs Take by mouth 2 (two) times daily.   RABEprazole 20 MG tablet Commonly known as: ACIPHEX Take 20 mg by mouth daily.   sildenafil 100 MG tablet Commonly known as: VIAGRA TAKE 1 TABLET DAILY AS NEEDED FOR ERECTILE DYSFUNCTION   testosterone cypionate 200 MG/ML injection Commonly known as: DEPOTESTOSTERONE CYPIONATE Inject 0.75 mL into the muscle every other week   venlafaxine XR 37.5 MG 24 hr capsule Commonly known as: EFFEXOR-XR Take 37.5 mg by mouth daily.        Allergies:  Allergies  Allergen Reactions   Sulfonamide Derivatives Rash    Family History  Problem Relation Age of Onset   Hypertension Father    Heart attack Maternal Uncle     Social History:  reports that he quit smoking about 57 years ago. His smoking use included cigarettes. He started smoking about 75 years ago. He has a 18.1 pack-year smoking history. He quit smokeless tobacco use about 55 years ago.  His smokeless tobacco use included chew. He reports that he does not drink alcohol and does not use drugs.  ROS: A complete review of systems was performed.  All systems are negative except for pertinent findings as noted.  Physical Exam:  Vital signs in last 24 hours: There were no vitals taken for this visit. Constitutional:  Alert and oriented, No acute distress Cardiovascular: Regular rate  Respiratory: Normal respiratory effortl  deficits Psychiatric: Normal mood and affect  I have reviewed prior pt notes  I have reviewed urinalysis results  I have independently reviewed prior imaging  I have reviewed prior PSA and T results  Most recent PSA from 10/12/2022--0.6 (prior level 0.2) Testosterone, total 1 week after injection-[id number]  Procedure note: After informed consent, attempted cystoscopy performed.  He had a moderate narrowing of his prostatic urethra which made it difficult to pass through.  Cystoscopy was aborted.  Impression/Assessment:  Low testosterone-on repletion 0.75 mL every other week.  Mid cycle testosterone level quite high He did have PSA go up to 0.6  Gross hematuria, resolved.  He does have a moderate urethral stricture that I did not attempt to dilate as he is not having significant lower urinary tract symptoms  Plan:  I let him know that he can continue his testosterone injections from now, I do not have a strong worry about  his mild PSA increase  I will hold off on further evaluation of his hematuria as his urine is clear today and he is not having significant lower urinary tract symptoms  I will see him back in about 3 months.  I did recommend he get PSA and testosterone levels drawn when he sees Dr. Sherryll Burger next month

## 2023-04-12 ENCOUNTER — Ambulatory Visit (INDEPENDENT_AMBULATORY_CARE_PROVIDER_SITE_OTHER): Payer: Medicare Other | Admitting: Urology

## 2023-04-12 ENCOUNTER — Encounter: Payer: Self-pay | Admitting: Urology

## 2023-04-12 VITALS — BP 135/86 | HR 69

## 2023-04-12 DIAGNOSIS — N35919 Unspecified urethral stricture, male, unspecified site: Secondary | ICD-10-CM | POA: Diagnosis not present

## 2023-04-12 DIAGNOSIS — Z8546 Personal history of malignant neoplasm of prostate: Secondary | ICD-10-CM

## 2023-04-12 DIAGNOSIS — E291 Testicular hypofunction: Secondary | ICD-10-CM

## 2023-04-12 DIAGNOSIS — N401 Enlarged prostate with lower urinary tract symptoms: Secondary | ICD-10-CM

## 2023-04-12 DIAGNOSIS — N138 Other obstructive and reflux uropathy: Secondary | ICD-10-CM | POA: Diagnosis not present

## 2023-04-12 LAB — URINALYSIS, ROUTINE W REFLEX MICROSCOPIC
Bilirubin, UA: NEGATIVE
Glucose, UA: NEGATIVE
Leukocytes,UA: NEGATIVE
Nitrite, UA: NEGATIVE
Protein,UA: NEGATIVE
RBC, UA: NEGATIVE
Specific Gravity, UA: 1.025 (ref 1.005–1.030)
Urobilinogen, Ur: 0.2 mg/dL (ref 0.2–1.0)
pH, UA: 5.5 (ref 5.0–7.5)

## 2023-04-12 MED ORDER — CIPROFLOXACIN HCL 500 MG PO TABS
500.0000 mg | ORAL_TABLET | Freq: Once | ORAL | Status: DC
Start: 2023-04-12 — End: 2023-05-03

## 2023-04-18 ENCOUNTER — Telehealth: Payer: Self-pay

## 2023-04-18 NOTE — Telephone Encounter (Signed)
Patient is made aware and voiced understanding. 

## 2023-04-18 NOTE — Telephone Encounter (Signed)
-----   Message from Donnita Falls sent at 04/18/2023  1:03 PM EDT ----- Please let pt know his CT showed no acute findings - nothing to explain hematuria. No GU stones, masses, or hydronephrosis.

## 2023-04-22 ENCOUNTER — Encounter: Payer: Self-pay | Admitting: Urology

## 2023-05-03 ENCOUNTER — Ambulatory Visit: Payer: Medicare Other | Attending: Cardiology | Admitting: Cardiology

## 2023-05-03 ENCOUNTER — Encounter: Payer: Self-pay | Admitting: Cardiology

## 2023-05-03 VITALS — BP 128/76 | HR 72 | Ht 70.0 in | Wt 218.8 lb

## 2023-05-03 DIAGNOSIS — Z1339 Encounter for screening examination for other mental health and behavioral disorders: Secondary | ICD-10-CM | POA: Diagnosis not present

## 2023-05-03 DIAGNOSIS — Z79899 Other long term (current) drug therapy: Secondary | ICD-10-CM | POA: Diagnosis not present

## 2023-05-03 DIAGNOSIS — I10 Essential (primary) hypertension: Secondary | ICD-10-CM | POA: Diagnosis not present

## 2023-05-03 DIAGNOSIS — Z23 Encounter for immunization: Secondary | ICD-10-CM | POA: Diagnosis not present

## 2023-05-03 DIAGNOSIS — E7800 Pure hypercholesterolemia, unspecified: Secondary | ICD-10-CM | POA: Diagnosis not present

## 2023-05-03 DIAGNOSIS — Z299 Encounter for prophylactic measures, unspecified: Secondary | ICD-10-CM | POA: Diagnosis not present

## 2023-05-03 DIAGNOSIS — I4821 Permanent atrial fibrillation: Secondary | ICD-10-CM | POA: Insufficient documentation

## 2023-05-03 DIAGNOSIS — Z7189 Other specified counseling: Secondary | ICD-10-CM | POA: Diagnosis not present

## 2023-05-03 DIAGNOSIS — I5030 Unspecified diastolic (congestive) heart failure: Secondary | ICD-10-CM | POA: Diagnosis not present

## 2023-05-03 DIAGNOSIS — R5383 Other fatigue: Secondary | ICD-10-CM | POA: Diagnosis not present

## 2023-05-03 DIAGNOSIS — Z1331 Encounter for screening for depression: Secondary | ICD-10-CM | POA: Diagnosis not present

## 2023-05-03 DIAGNOSIS — R52 Pain, unspecified: Secondary | ICD-10-CM | POA: Diagnosis not present

## 2023-05-03 DIAGNOSIS — I482 Chronic atrial fibrillation, unspecified: Secondary | ICD-10-CM | POA: Diagnosis not present

## 2023-05-03 DIAGNOSIS — D6869 Other thrombophilia: Secondary | ICD-10-CM | POA: Diagnosis not present

## 2023-05-03 DIAGNOSIS — Z0000 Encounter for general adult medical examination without abnormal findings: Secondary | ICD-10-CM | POA: Diagnosis not present

## 2023-05-03 NOTE — Progress Notes (Signed)
    Cardiology Office Note  Date: 05/03/2023   ID: Nicholas [name], DOB [birth year], MRN [id number]  History of Present Illness: Nicholas [name] is a 89 y.o. male last seen in March.  He is here for a routine visit.  Reports no palpitations or chest pain.  Denies any falls, no sudden dizziness or syncope.  He uses a cane when he gets out.  He had a visit with Dr. Sherryll Burger today and had lab work which is pending at this time.  I reviewed his medications.  Current cardiac regimen includes Eliquis and Lopressor.  Heart rate well-controlled today.  He does not report any spontaneous bleeding problems.  I reviewed his lab work from June as noted below.  Physical Exam: VS:  BP 128/76   Pulse 72   Ht 5\' 10"  (1.778 m)   Wt 218 lb 12.8 oz (99.2 kg)   SpO2 97%   BMI 31.39 kg/m , BMI Body mass index is 31.39 kg/m.  Wt Readings from Last 3 Encounters:  05/03/23 218 lb 12.8 oz (99.2 kg)  09/16/22 223 lb 3.2 oz (101.2 kg)  03/16/22 230 lb 12.8 oz (104.7 kg)    General: Patient appears comfortable at rest. HEENT: Conjunctiva and lids normal. Neck: Supple, no elevated JVP or carotid bruits. Lungs: Clear to auscultation, nonlabored breathing at rest. Cardiac: Irregularly irregular, 1/6 systolic murmur, no gallop.  ECG:  An ECG dated 09/16/2022 was personally reviewed today and demonstrated:  Rate controlled atrial fibrillation.  Labwork: June 2024: Cholesterol 138, triglycerides 91, HDL 52, LDL 75, hemoglobin A1c 5.6%, hemoglobin 14.0, platelets 187, potassium 4.3, BUN 15, creatinine 1.03, GFR 69, AST 28, ALT 9 04/06/2023: Creatinine, Ser 1.20   Other Studies Reviewed Today:  No interval cardiac testing for review today.  Assessment and Plan:  1.  Permanent atrial fibrillation with CHA2DS2-VASc score of 3.  Asymptomatic and with adequate heart rate control on Lopressor.  Continue Eliquis for stroke prophylaxis.  He denies any spontaneous bleeding problems.   2.  Primary hypertension.  Blood  pressure control is good today.  No changes made to current regimen.  Disposition:  Follow up  6 months.  Signed, Jonelle Sidle, M.D., F.A.C.C. [facility] at [facility]

## 2023-05-03 NOTE — Patient Instructions (Addendum)

## 2023-05-05 DIAGNOSIS — I482 Chronic atrial fibrillation, unspecified: Secondary | ICD-10-CM | POA: Diagnosis not present

## 2023-05-12 DIAGNOSIS — H353113 Nonexudative age-related macular degeneration, right eye, advanced atrophic without subfoveal involvement: Secondary | ICD-10-CM | POA: Diagnosis not present

## 2023-05-12 DIAGNOSIS — H43393 Other vitreous opacities, bilateral: Secondary | ICD-10-CM | POA: Diagnosis not present

## 2023-05-12 DIAGNOSIS — H43813 Vitreous degeneration, bilateral: Secondary | ICD-10-CM | POA: Diagnosis not present

## 2023-05-12 DIAGNOSIS — H353211 Exudative age-related macular degeneration, right eye, with active choroidal neovascularization: Secondary | ICD-10-CM | POA: Diagnosis not present

## 2023-05-12 DIAGNOSIS — H353124 Nonexudative age-related macular degeneration, left eye, advanced atrophic with subfoveal involvement: Secondary | ICD-10-CM | POA: Diagnosis not present

## 2023-05-12 DIAGNOSIS — H35033 Hypertensive retinopathy, bilateral: Secondary | ICD-10-CM | POA: Diagnosis not present

## 2023-05-12 NOTE — Progress Notes (Signed)
Letter sent.

## 2023-05-24 DIAGNOSIS — I739 Peripheral vascular disease, unspecified: Secondary | ICD-10-CM | POA: Diagnosis not present

## 2023-06-15 DIAGNOSIS — I482 Chronic atrial fibrillation, unspecified: Secondary | ICD-10-CM | POA: Diagnosis not present

## 2023-06-15 DIAGNOSIS — J449 Chronic obstructive pulmonary disease, unspecified: Secondary | ICD-10-CM | POA: Diagnosis not present

## 2023-06-15 DIAGNOSIS — R5383 Other fatigue: Secondary | ICD-10-CM | POA: Diagnosis not present

## 2023-06-15 DIAGNOSIS — Z299 Encounter for prophylactic measures, unspecified: Secondary | ICD-10-CM | POA: Diagnosis not present

## 2023-06-15 DIAGNOSIS — R7989 Other specified abnormal findings of blood chemistry: Secondary | ICD-10-CM | POA: Diagnosis not present

## 2023-06-15 DIAGNOSIS — I10 Essential (primary) hypertension: Secondary | ICD-10-CM | POA: Diagnosis not present

## 2023-06-17 DIAGNOSIS — R5383 Other fatigue: Secondary | ICD-10-CM | POA: Diagnosis not present

## 2023-06-23 DIAGNOSIS — H353124 Nonexudative age-related macular degeneration, left eye, advanced atrophic with subfoveal involvement: Secondary | ICD-10-CM | POA: Diagnosis not present

## 2023-06-23 DIAGNOSIS — H353113 Nonexudative age-related macular degeneration, right eye, advanced atrophic without subfoveal involvement: Secondary | ICD-10-CM | POA: Diagnosis not present

## 2023-07-07 ENCOUNTER — Other Ambulatory Visit: Payer: Self-pay | Admitting: Urology

## 2023-07-07 DIAGNOSIS — N5201 Erectile dysfunction due to arterial insufficiency: Secondary | ICD-10-CM

## 2023-07-11 DIAGNOSIS — D0461 Carcinoma in situ of skin of right upper limb, including shoulder: Secondary | ICD-10-CM | POA: Diagnosis not present

## 2023-07-11 DIAGNOSIS — L570 Actinic keratosis: Secondary | ICD-10-CM | POA: Diagnosis not present

## 2023-07-11 DIAGNOSIS — D485 Neoplasm of uncertain behavior of skin: Secondary | ICD-10-CM | POA: Diagnosis not present

## 2023-07-18 NOTE — Progress Notes (Signed)
History of Present Illness:   Nicholas [name] has a history of prostate cancer.  Here for annual follow-up.  He also has a history of BPH.  He initially presented in March, 2013, already with a diagnosis of prostate cancer, established in July of 2011. At that time, he had an elevated PSA and underwent TRUS/Bx. 2/12 biopsies both the right side, revealed GS 3+3 pattern. The patient went to [facility] for consultation with Dr Luane School, who recommended robotic removal of his prostate, despite his age of 67. The patient requested a second opinion with the radiotherapy department there, and it was recommended that he be followed conservatively with active surveillance. He initially did that, with his PSA staying stable at between 6 and 7, until 5 March, 2013 when it went up to 9.5.  He then underwent repeat TRUS/Bx here in April, 2013. 7/12 cores came back positive for adenocarcinoma on that biopsy. 3/12 revealed a GS 3+3 pattern, 4/12 revealed a GS 3+4 pattern. It was recommended that he undergo radiotherapy (EBRT), which he completed in late September, 2013.    1.30.2024: Recent PSA 0.2, T level 119.  He has no prostate cancer related issues.  With his low testosterone level which was drawn in the morning appropriately, he is having low energy level and easy fatigue.  He was started on testosterone replacement, 150 mg IM every other week.  10.15.2024: Here for possible cystoscopy.  Had 1 or 2 episodes of blood on his undershorts recently with microscopic hematuria.  He denies current gross hematuria.  No lower urinary tract symptoms.  He is not sure if the testosterone injections are making him feel any better.  Most recent PSA is 0.6, in July.  Past Medical History:  Diagnosis Date   Atrial fibrillation (HCC)    Cervical spondylosis without myelopathy    COPD (chronic obstructive pulmonary disease) (HCC)    Diverticulosis of colon (without mention of hemorrhage)     Esophageal reflux    Essential hypertension    Malignant neoplasm of prostate (HCC)    Obstructive sleep apnea    Osteoarthritis     Past Surgical History:  Procedure Laterality Date   CHOLECYSTECTOMY N/A 07/03/2014   Procedure: LAPAROSCOPIC CHOLECYSTECTOMY;  Surgeon: Dalia Heading, MD;  Location: [facility];  Service: General;  Laterality: N/A;   COLONOSCOPY     COLONOSCOPY N/A 12/18/2015   Procedure: COLONOSCOPY;  Surgeon: Malissa Hippo, MD;  Location: [facility];  Service: Endoscopy;  Laterality: N/A;  1200   ESOPHAGEAL DILATION N/A 08/04/2016   Procedure: ESOPHAGEAL DILATION;  Surgeon: Malissa Hippo, MD;  Location: [facility];  Service: Endoscopy;  Laterality: N/A;   ESOPHAGOGASTRODUODENOSCOPY N/A 08/04/2016   Procedure: ESOPHAGOGASTRODUODENOSCOPY (EGD);  Surgeon: Malissa Hippo, MD;  Location: [facility];  Service: Endoscopy;  Laterality: N/A;  12:00   ESOPHAGOGASTRODUODENOSCOPY (EGD) WITH ESOPHAGEAL DILATION N/A 06/18/2013   Procedure: ESOPHAGOGASTRODUODENOSCOPY (EGD) WITH ESOPHAGEAL DILATION;  Surgeon: Malissa Hippo, MD;  Location: [facility];  Service: Endoscopy;  Laterality: N/A;  220-rescheduled to 730 Ann to notify pt   ROTATOR CUFF REPAIR     TOTAL KNEE ARTHROPLASTY     2000, 2005.     Home Medications:  Allergies as of 07/19/2023       Reactions   Sulfonamide Derivatives Rash        Medication List        Accurate as of July 18, 2023  1:45 PM. If you have  any questions, ask your nurse or doctor.          albuterol-ipratropium 18-103 MCG/ACT inhaler Commonly known as: COMBIVENT Inhale 2 puffs into the lungs every 6 (six) hours as needed for wheezing or shortness of breath (uses rarely). UAD PRN   allopurinol 100 MG tablet Commonly known as: ZYLOPRIM Take 100 mg by mouth daily.   apixaban 5 MG Tabs tablet Commonly known as: Eliquis Take 1 tablet (5 mg total) by mouth 2 (two) times daily.   BD Disp Needles 18G X 1-1/2" Misc Generic drug:  NEEDLE (DISP) 18 G USE TO DRAW UP TESTOSTERONE INJECTION   cholecalciferol 25 MCG (1000 UNIT) tablet Commonly known as: VITAMIN D3 Take 1,000 Units by mouth daily.   Luer Lock Safety Syringes 22G X 1-1/2" 3 ML Misc Generic drug: SYRINGE-NEEDLE (DISP) 3 ML Use to inject testosterone   metoprolol tartrate 25 MG tablet Commonly known as: LOPRESSOR Take 1 tablet by mouth 2 (two) times daily.   ondansetron 4 MG tablet Commonly known as: ZOFRAN Take 1 tablet (4 mg total) by mouth every 8 (eight) hours as needed for nausea or vomiting.   PreserVision AREDS Tabs Take by mouth 2 (two) times daily.   RABEprazole 20 MG tablet Commonly known as: ACIPHEX Take 20 mg by mouth daily.   sildenafil 100 MG tablet Commonly known as: VIAGRA TAKE 1 TABLET DAILY AS NEEDED FOR ERECTILE DYSFUNCTION   testosterone cypionate 200 MG/ML injection Commonly known as: DEPOTESTOSTERONE CYPIONATE Inject 0.75 mL into the muscle every other week   venlafaxine XR 37.5 MG 24 hr capsule Commonly known as: EFFEXOR-XR Take 37.5 mg by mouth daily.        Allergies:  Allergies  Allergen Reactions   Sulfonamide Derivatives Rash    Family History  Problem Relation Age of Onset   Hypertension Father    Heart attack Maternal Uncle     Social History:  reports that he quit smoking about 58 years ago. His smoking use included cigarettes. He started smoking about 76 years ago. He has a 18.1 pack-year smoking history. He quit smokeless tobacco use about 56 years ago.  His smokeless tobacco use included chew. He reports that he does not drink alcohol and does not use drugs.  ROS: A complete review of systems was performed.  All systems are negative except for pertinent findings as noted.  Physical Exam:  Vital signs in last 24 hours: There were no vitals taken for this visit. Constitutional:  Alert and oriented, No acute distress Cardiovascular: Regular rate  Respiratory: Normal respiratory effortl  deficits Psychiatric: Normal mood and affect  I have reviewed prior pt notes  I have reviewed urinalysis results  I have independently reviewed prior imaging  I have reviewed prior PSA and T results  Most recent PSA from 10/12/2022--0.6 (prior level 0.2) Testosterone, total 1 week after injection-[id number]  Procedure note: After informed consent, attempted cystoscopy performed.  He had a moderate narrowing of his prostatic urethra which made it difficult to pass through.  Cystoscopy was aborted.  Impression/Assessment:  Low testosterone-on repletion 0.75 mL every other week.  Mid cycle testosterone level quite high He did have PSA go up to 0.6  Gross hematuria, resolved.  He does have a moderate urethral stricture that I did not attempt to dilate as he is not having significant lower urinary tract symptoms  Plan:  I let him know that he can continue his testosterone injections from now, I do not have a strong worry  about his mild PSA increase  I will hold off on further evaluation of his hematuria as his urine is clear today and he is not having significant lower urinary tract symptoms  I will see him back in about 3 months.  I did recommend he get PSA and testosterone levels drawn when he sees Dr. Sherryll Burger next month

## 2023-07-19 ENCOUNTER — Encounter: Payer: Self-pay | Admitting: Urology

## 2023-07-19 ENCOUNTER — Ambulatory Visit (INDEPENDENT_AMBULATORY_CARE_PROVIDER_SITE_OTHER): Payer: Medicare Other | Admitting: Urology

## 2023-07-19 VITALS — BP 163/80 | HR 71

## 2023-07-19 DIAGNOSIS — E291 Testicular hypofunction: Secondary | ICD-10-CM

## 2023-07-19 DIAGNOSIS — Z8546 Personal history of malignant neoplasm of prostate: Secondary | ICD-10-CM | POA: Diagnosis not present

## 2023-07-19 DIAGNOSIS — Z87898 Personal history of other specified conditions: Secondary | ICD-10-CM | POA: Diagnosis not present

## 2023-07-19 DIAGNOSIS — N138 Other obstructive and reflux uropathy: Secondary | ICD-10-CM

## 2023-07-19 LAB — MICROSCOPIC EXAMINATION: Bacteria, UA: NONE SEEN

## 2023-07-19 LAB — URINALYSIS, ROUTINE W REFLEX MICROSCOPIC
Bilirubin, UA: NEGATIVE
Glucose, UA: NEGATIVE
Leukocytes,UA: NEGATIVE
Nitrite, UA: NEGATIVE
RBC, UA: NEGATIVE
Specific Gravity, UA: 1.03 (ref 1.005–1.030)
Urobilinogen, Ur: 1 mg/dL (ref 0.2–1.0)
pH, UA: 6 (ref 5.0–7.5)

## 2023-07-20 LAB — PSA: Prostate Specific Ag, Serum: 0.5 ng/mL (ref 0.0–4.0)

## 2023-07-20 LAB — TESTOSTERONE: Testosterone: 570 ng/dL (ref 264–916)

## 2023-07-22 ENCOUNTER — Telehealth: Payer: Self-pay

## 2023-07-22 NOTE — Telephone Encounter (Signed)
Letter mailed making patient aware.

## 2023-07-22 NOTE — Telephone Encounter (Signed)
-----   Message from Bertram Millard Dahlstedt sent at 07/22/2023 11:41 AM EST ----- Pt would prefer a card/letter that PSA lower @ 0.5 and T level nml at over 500 ----- Message ----- From: Interface, [facility] Lab Results In Sent: 07/19/2023   3:35 PM EST To: Marcine Matar, MD

## 2023-07-28 DIAGNOSIS — C44622 Squamous cell carcinoma of skin of right upper limb, including shoulder: Secondary | ICD-10-CM | POA: Diagnosis not present

## 2023-07-29 ENCOUNTER — Telehealth: Payer: Self-pay

## 2023-07-29 NOTE — Telephone Encounter (Signed)
Pt called to confirm his PSA from MD Dahlstedt Pt states he got a letter with his results and wanted to make sure he should continue his testosterone injections per MD last office note he advise Pt to continue testosterone injections .75 mg every other week. Pt was notified and voiced understanding

## 2023-08-02 DIAGNOSIS — I739 Peripheral vascular disease, unspecified: Secondary | ICD-10-CM | POA: Diagnosis not present

## 2023-08-04 DIAGNOSIS — H35033 Hypertensive retinopathy, bilateral: Secondary | ICD-10-CM | POA: Diagnosis not present

## 2023-08-04 DIAGNOSIS — H353211 Exudative age-related macular degeneration, right eye, with active choroidal neovascularization: Secondary | ICD-10-CM | POA: Diagnosis not present

## 2023-08-04 DIAGNOSIS — H353113 Nonexudative age-related macular degeneration, right eye, advanced atrophic without subfoveal involvement: Secondary | ICD-10-CM | POA: Diagnosis not present

## 2023-08-04 DIAGNOSIS — H43393 Other vitreous opacities, bilateral: Secondary | ICD-10-CM | POA: Diagnosis not present

## 2023-08-04 DIAGNOSIS — H353124 Nonexudative age-related macular degeneration, left eye, advanced atrophic with subfoveal involvement: Secondary | ICD-10-CM | POA: Diagnosis not present

## 2023-08-04 DIAGNOSIS — H43813 Vitreous degeneration, bilateral: Secondary | ICD-10-CM | POA: Diagnosis not present

## 2023-09-13 DIAGNOSIS — Z299 Encounter for prophylactic measures, unspecified: Secondary | ICD-10-CM | POA: Diagnosis not present

## 2023-09-13 DIAGNOSIS — R52 Pain, unspecified: Secondary | ICD-10-CM | POA: Diagnosis not present

## 2023-09-13 DIAGNOSIS — R5383 Other fatigue: Secondary | ICD-10-CM | POA: Diagnosis not present

## 2023-09-13 DIAGNOSIS — I5030 Unspecified diastolic (congestive) heart failure: Secondary | ICD-10-CM | POA: Diagnosis not present

## 2023-09-13 DIAGNOSIS — J449 Chronic obstructive pulmonary disease, unspecified: Secondary | ICD-10-CM | POA: Diagnosis not present

## 2023-09-13 DIAGNOSIS — I10 Essential (primary) hypertension: Secondary | ICD-10-CM | POA: Diagnosis not present

## 2023-09-13 DIAGNOSIS — R7989 Other specified abnormal findings of blood chemistry: Secondary | ICD-10-CM | POA: Diagnosis not present

## 2023-09-29 DIAGNOSIS — H353124 Nonexudative age-related macular degeneration, left eye, advanced atrophic with subfoveal involvement: Secondary | ICD-10-CM | POA: Diagnosis not present

## 2023-09-29 DIAGNOSIS — H353113 Nonexudative age-related macular degeneration, right eye, advanced atrophic without subfoveal involvement: Secondary | ICD-10-CM | POA: Diagnosis not present

## 2023-10-11 DIAGNOSIS — L110 Acquired keratosis follicularis: Secondary | ICD-10-CM | POA: Diagnosis not present

## 2023-10-11 DIAGNOSIS — M79674 Pain in right toe(s): Secondary | ICD-10-CM | POA: Diagnosis not present

## 2023-10-11 DIAGNOSIS — M79671 Pain in right foot: Secondary | ICD-10-CM | POA: Diagnosis not present

## 2023-10-11 DIAGNOSIS — M79675 Pain in left toe(s): Secondary | ICD-10-CM | POA: Diagnosis not present

## 2023-10-11 DIAGNOSIS — I739 Peripheral vascular disease, unspecified: Secondary | ICD-10-CM | POA: Diagnosis not present

## 2023-10-11 DIAGNOSIS — L565 Disseminated superficial actinic porokeratosis (DSAP): Secondary | ICD-10-CM | POA: Diagnosis not present

## 2023-10-11 DIAGNOSIS — M79672 Pain in left foot: Secondary | ICD-10-CM | POA: Diagnosis not present

## 2023-11-24 DIAGNOSIS — H43813 Vitreous degeneration, bilateral: Secondary | ICD-10-CM | POA: Diagnosis not present

## 2023-11-24 DIAGNOSIS — H35033 Hypertensive retinopathy, bilateral: Secondary | ICD-10-CM | POA: Diagnosis not present

## 2023-11-24 DIAGNOSIS — H353113 Nonexudative age-related macular degeneration, right eye, advanced atrophic without subfoveal involvement: Secondary | ICD-10-CM | POA: Diagnosis not present

## 2023-11-24 DIAGNOSIS — H353124 Nonexudative age-related macular degeneration, left eye, advanced atrophic with subfoveal involvement: Secondary | ICD-10-CM | POA: Diagnosis not present

## 2023-11-24 DIAGNOSIS — H43393 Other vitreous opacities, bilateral: Secondary | ICD-10-CM | POA: Diagnosis not present

## 2023-11-24 DIAGNOSIS — H353211 Exudative age-related macular degeneration, right eye, with active choroidal neovascularization: Secondary | ICD-10-CM | POA: Diagnosis not present

## 2023-11-29 ENCOUNTER — Telehealth: Payer: Self-pay

## 2023-11-29 NOTE — Telephone Encounter (Signed)
 Pt called to get Testosterone  [facility] pt notified that Rx will be faxed out today pt voiced his understanding

## 2023-12-20 DIAGNOSIS — L110 Acquired keratosis follicularis: Secondary | ICD-10-CM | POA: Diagnosis not present

## 2023-12-20 DIAGNOSIS — M79671 Pain in right foot: Secondary | ICD-10-CM | POA: Diagnosis not present

## 2023-12-20 DIAGNOSIS — I739 Peripheral vascular disease, unspecified: Secondary | ICD-10-CM | POA: Diagnosis not present

## 2023-12-20 DIAGNOSIS — M79672 Pain in left foot: Secondary | ICD-10-CM | POA: Diagnosis not present

## 2023-12-20 DIAGNOSIS — L565 Disseminated superficial actinic porokeratosis (DSAP): Secondary | ICD-10-CM | POA: Diagnosis not present

## 2023-12-20 DIAGNOSIS — M79675 Pain in left toe(s): Secondary | ICD-10-CM | POA: Diagnosis not present

## 2023-12-20 DIAGNOSIS — M79674 Pain in right toe(s): Secondary | ICD-10-CM | POA: Diagnosis not present

## 2024-01-17 ENCOUNTER — Ambulatory Visit: Payer: Medicare Other | Admitting: Urology

## 2024-01-19 DIAGNOSIS — H35033 Hypertensive retinopathy, bilateral: Secondary | ICD-10-CM | POA: Diagnosis not present

## 2024-01-19 DIAGNOSIS — H43393 Other vitreous opacities, bilateral: Secondary | ICD-10-CM | POA: Diagnosis not present

## 2024-01-19 DIAGNOSIS — H43813 Vitreous degeneration, bilateral: Secondary | ICD-10-CM | POA: Diagnosis not present

## 2024-01-19 DIAGNOSIS — H353211 Exudative age-related macular degeneration, right eye, with active choroidal neovascularization: Secondary | ICD-10-CM | POA: Diagnosis not present

## 2024-01-19 DIAGNOSIS — H353124 Nonexudative age-related macular degeneration, left eye, advanced atrophic with subfoveal involvement: Secondary | ICD-10-CM | POA: Diagnosis not present

## 2024-01-19 DIAGNOSIS — H353113 Nonexudative age-related macular degeneration, right eye, advanced atrophic without subfoveal involvement: Secondary | ICD-10-CM | POA: Diagnosis not present

## 2024-01-23 DIAGNOSIS — L820 Inflamed seborrheic keratosis: Secondary | ICD-10-CM | POA: Diagnosis not present

## 2024-01-23 DIAGNOSIS — D485 Neoplasm of uncertain behavior of skin: Secondary | ICD-10-CM | POA: Diagnosis not present

## 2024-01-23 DIAGNOSIS — L570 Actinic keratosis: Secondary | ICD-10-CM | POA: Diagnosis not present

## 2024-02-23 ENCOUNTER — Ambulatory Visit: Attending: Cardiology | Admitting: Cardiology

## 2024-02-23 ENCOUNTER — Encounter: Payer: Self-pay | Admitting: Cardiology

## 2024-02-23 VITALS — BP 132/78 | HR 72 | Wt 216.4 lb

## 2024-02-23 DIAGNOSIS — I10 Essential (primary) hypertension: Secondary | ICD-10-CM | POA: Insufficient documentation

## 2024-02-23 DIAGNOSIS — I4821 Permanent atrial fibrillation: Secondary | ICD-10-CM | POA: Insufficient documentation

## 2024-02-23 NOTE — Patient Instructions (Addendum)

## 2024-02-23 NOTE — Progress Notes (Signed)
    Cardiology Office Note  Date: 02/23/2024   ID: Nicholas [name], DOB April 16, 1929, MRN [id number]  History of Present Illness: Nicholas [name] is a 89 y.o. male last seen in November 2024.  He is here for a routine visit.  Reports no palpitations or chest pain, chronic fatigue as before.  This has not changed with testosterone  supplementation (per urology) and he is considering stopping it.  We went over his medications.  He continues on Lopressor  25 mg twice daily with good heart rate control of atrial fibrillation.  Also Eliquis  5 mg twice daily for stroke prophylaxis.  Recent lab work is outlined below.  I reviewed his ECG today which shows rate controlled atrial fibrillation at 72 bpm with leftward axis.  Physical Exam: VS:  BP 132/78 (BP Location: Left Arm)   Pulse 72   Wt 216 lb 6.4 oz (98.2 kg)   SpO2 98%   BMI 31.05 kg/m , BMI Body mass index is 31.05 kg/m.  Wt Readings from Last 3 Encounters:  02/23/24 216 lb 6.4 oz (98.2 kg)  05/03/23 218 lb 12.8 oz (99.2 kg)  09/16/22 223 lb 3.2 oz (101.2 kg)    General: Patient appears comfortable at rest. HEENT: Conjunctiva and lids normal. Neck: Supple, no elevated JVP or carotid bruits. Lungs: Clear to auscultation, nonlabored breathing at rest. Cardiac: Irregularly irregular, 1/6 systolic murmur.  ECG:  An ECG dated 09/16/2022 was personally reviewed today and demonstrated:  Atrial fibrillation.  Labwork: 04/06/2023: Creatinine, Ser 1.18 May 2023: Hemoglobin 14.6, platelets 192, BUN 16, creatinine 1.09, GFR 64, potassium 6, AST 33, ALT 8, cholesterol 135, triglycerides 83, HDL 54, LDL 65, TSH 2.99 June 2025: Cholesterol 150, HDL 57, LDL 89, triglycerides 104, PSA 0.36, hemoglobin 14.4, platelets 178, BUN 12, creatinine 1.12, GFR 62, potassium 4.4, AST 46, ALT 19, TSH 2.918, testosterone  167  Other Studies Reviewed Today:  No interval cardiac testing for review today.  Assessment and Plan:  1.  Permanent atrial  fibrillation with CHA2DS2-VASc score of 3.  Continue strategy of heart rate control and anticoagulation.  ECG reviewed.  Continue Lopressor  25 mg twice daily and Eliquis  5 mg twice daily.  He does not report any spontaneous bleeding problems.  Recent lab work reviewed.   2.  Primary hypertension.  No change in current regimen.  Keep follow-up with PCP.  Disposition:  Follow up 6 months.  Signed, Jayson JUDITHANN Sierras, M.D., F.A.C.C. [facility]

## 2024-03-05 DIAGNOSIS — L110 Acquired keratosis follicularis: Secondary | ICD-10-CM | POA: Diagnosis not present

## 2024-03-05 DIAGNOSIS — I739 Peripheral vascular disease, unspecified: Secondary | ICD-10-CM | POA: Diagnosis not present

## 2024-03-05 DIAGNOSIS — L565 Disseminated superficial actinic porokeratosis (DSAP): Secondary | ICD-10-CM | POA: Diagnosis not present

## 2024-03-05 DIAGNOSIS — M79674 Pain in right toe(s): Secondary | ICD-10-CM | POA: Diagnosis not present

## 2024-03-05 DIAGNOSIS — M79672 Pain in left foot: Secondary | ICD-10-CM | POA: Diagnosis not present

## 2024-03-05 DIAGNOSIS — M79671 Pain in right foot: Secondary | ICD-10-CM | POA: Diagnosis not present

## 2024-03-05 DIAGNOSIS — M79675 Pain in left toe(s): Secondary | ICD-10-CM | POA: Diagnosis not present

## 2024-03-06 ENCOUNTER — Ambulatory Visit (INDEPENDENT_AMBULATORY_CARE_PROVIDER_SITE_OTHER): Admitting: Urology

## 2024-03-06 VITALS — BP 139/83 | HR 86

## 2024-03-06 DIAGNOSIS — Z8546 Personal history of malignant neoplasm of prostate: Secondary | ICD-10-CM

## 2024-03-06 DIAGNOSIS — E291 Testicular hypofunction: Secondary | ICD-10-CM

## 2024-03-06 DIAGNOSIS — R7989 Other specified abnormal findings of blood chemistry: Secondary | ICD-10-CM

## 2024-03-06 DIAGNOSIS — N5201 Erectile dysfunction due to arterial insufficiency: Secondary | ICD-10-CM

## 2024-03-06 LAB — URINALYSIS, ROUTINE W REFLEX MICROSCOPIC
Bilirubin, UA: NEGATIVE
Glucose, UA: NEGATIVE
Leukocytes,UA: NEGATIVE
Nitrite, UA: NEGATIVE
Protein,UA: NEGATIVE
RBC, UA: NEGATIVE
Specific Gravity, UA: 1.03 (ref 1.005–1.030)
Urobilinogen, Ur: 0.2 mg/dL (ref 0.2–1.0)
pH, UA: 5.5 (ref 5.0–7.5)

## 2024-03-06 MED ORDER — TESTOSTERONE CYPIONATE 200 MG/ML IM SOLN: INTRAMUSCULAR | 1 refills | Status: AC

## 2024-03-06 MED ORDER — SILDENAFIL CITRATE 100 MG PO TABS: ORAL_TABLET | ORAL | 7 refills | Status: AC

## 2024-03-06 NOTE — Progress Notes (Signed)
 History of Present Illness:   Nicholas [name] has a history of prostate cancer.  Here for annual follow-up.  He also has a history of BPH.  He initially presented in March, 2013, already with a diagnosis of prostate cancer, established in July of 2011. At that time, he had an elevated PSA and underwent TRUS/Bx. 2/12 biopsies both the right side, revealed GS 3+3 pattern. The patient went to [facility] for consultation with Dr Tommi, who recommended robotic removal of his prostate, despite his age of 89. The patient requested a second opinion with the radiotherapy department there, and it was recommended that he be followed conservatively with active surveillance. He initially did that, with his PSA staying stable at between 6 and 7, until 5 March, 2013 when it went up to 9.5.  He then underwent repeat TRUS/Bx here in April, 2013. 7/12 cores came back positive for adenocarcinoma on that biopsy. 3/12 revealed a GS 3+3 pattern, 4/12 revealed a GS 3+4 pattern. It was recommended that he undergo radiotherapy (EBRT), which he completed in late September, 2013.    1.30.2024: Recent PSA 0.2, T level 119.  He has no prostate cancer related issues.  With his low testosterone  level which was drawn in the morning appropriately, he is having low energy level and easy fatigue.  He was started on testosterone  replacement, 150 mg IM every other week.  10.15.2024: Here for possible cystoscopy.  Had 1 or 2 episodes of blood on his undershorts recently with microscopic hematuria.  1.20.2025: Testosterone  level from July 644 PSA 0.6.  His cystoscopy at his last visit showed a urethral stricture that was not passed with the scope.  He has not had significant urinary difficulty.  He is not having blood in his urine.  He is on testosterone  cypionate 150 mg IM every other week still, his next dose is due tomorrow.  9.9.2025: Here today for recheck.  Overall doing well.  Still injects 0.75 mL of  testosterone  cypionate every 2 weeks, the last injection was 6 days ago.  No blood in his urine or on his undershorts.  Good stream, feels like he empties well.  Past Medical History:  Diagnosis Date   Atrial fibrillation (HCC)    Cervical spondylosis without myelopathy    COPD (chronic obstructive pulmonary disease) (HCC)    Diverticulosis of colon (without mention of hemorrhage)    Esophageal reflux    Essential hypertension    Malignant neoplasm of prostate (HCC)    Obstructive sleep apnea    Osteoarthritis     Past Surgical History:  Procedure Laterality Date   CHOLECYSTECTOMY N/A 07/03/2014   Procedure: LAPAROSCOPIC CHOLECYSTECTOMY;  Surgeon: Oneil DELENA Budge, MD;  Location: [facility];  Service: General;  Laterality: N/A;   COLONOSCOPY     COLONOSCOPY N/A 12/18/2015   Procedure: COLONOSCOPY;  Surgeon: Claudis RAYMOND Rivet, MD;  Location: [facility];  Service: Endoscopy;  Laterality: N/A;  1200   ESOPHAGEAL DILATION N/A 08/04/2016   Procedure: ESOPHAGEAL DILATION;  Surgeon: Claudis RAYMOND Rivet, MD;  [facility]: [facility];  Service: Endoscopy;  Laterality: N/A;   ESOPHAGOGASTRODUODENOSCOPY N/A 08/04/2016   Procedure: ESOPHAGOGASTRODUODENOSCOPY (EGD);  Surgeon: Claudis RAYMOND Rivet, MD;  [facility]: [facility];  Service: Endoscopy;  Laterality: N/A;  12:00   ESOPHAGOGASTRODUODENOSCOPY (EGD) WITH ESOPHAGEAL DILATION N/A 06/18/2013   Procedure: ESOPHAGOGASTRODUODENOSCOPY (EGD) WITH ESOPHAGEAL DILATION;  Surgeon: Claudis RAYMOND Rivet, MD;  Location: [facility];  Service: Endoscopy;  Laterality: N/A;  220-rescheduled to  730 Ann to notify pt   ROTATOR CUFF REPAIR     TOTAL KNEE ARTHROPLASTY     2000, 2005.     Home Medications:  Allergies as of 03/06/2024       Reactions   Sulfonamide Derivatives Rash        Medication List        Accurate as of March 06, 2024  8:36 AM. If you have any questions, ask your nurse or doctor.          acetaminophen  500 MG tablet Commonly known as:  TYLENOL  Take 1,000 mg by mouth daily as needed for moderate pain (pain score 4-6).   albuterol -ipratropium 18-103 MCG/ACT inhaler Commonly known as: COMBIVENT Inhale 2 puffs into the lungs every 6 (six) hours as needed for wheezing or shortness of breath (uses rarely). UAD PRN   allopurinol  100 MG tablet Commonly known as: ZYLOPRIM  Take 100 mg by mouth daily.   ammonium lactate 12 % lotion Commonly known as: LAC-HYDRIN Apply 1 Application topically as needed.   amoxicillin 500 MG tablet Commonly known as: AMOXIL Take 500 mg by mouth. For dental procedures only   apixaban  5 MG Tabs tablet Commonly known as: Eliquis  Take 1 tablet (5 mg total) by mouth 2 (two) times daily.   BD Disp Needles 18G X 1-1/2 Misc Generic drug: NEEDLE (DISP) 18 G USE TO DRAW UP TESTOSTERONE  INJECTION   cholecalciferol 25 MCG (1000 UNIT) tablet Commonly known as: VITAMIN D3 Take 1,000 Units by mouth daily.   cyanocobalamin  500 MCG tablet Commonly known as: VITAMIN B12 Take 1,000 mcg by mouth daily.   loratadine  10 MG tablet Commonly known as: CLARITIN  Take 10 mg by mouth daily as needed.   Luer Lock Safety Syringes 22G X 1-1/2 3 ML Misc Generic drug: SYRINGE-NEEDLE (DISP) 3 ML Use to inject testosterone    metoprolol  tartrate 25 MG tablet Commonly known as: LOPRESSOR  Take 1 tablet by mouth 2 (two) times daily.   miconazole 2 % cream Commonly known as: MICOTIN Apply 1 Application topically daily as needed.   ondansetron  4 MG tablet Commonly known as: ZOFRAN  Take 1 tablet (4 mg total) by mouth every 8 (eight) hours as needed for nausea or vomiting.   PreserVision AREDS Tabs Take by mouth 2 (two) times daily.   RABEprazole 20 MG tablet Commonly known as: ACIPHEX Take 20 mg by mouth daily.   sildenafil  100 MG tablet Commonly known as: VIAGRA  TAKE 1 TABLET DAILY AS NEEDED FOR ERECTILE DYSFUNCTION   simethicone  80 MG chewable tablet Commonly known as: MYLICON Chew 80 mg by mouth  every 6 (six) hours as needed for flatulence.   testosterone  cypionate 200 MG/ML injection Commonly known as: DEPOTESTOSTERONE CYPIONATE Inject 0.75 mL into the muscle every other week   venlafaxine XR 37.5 MG 24 hr capsule Commonly known as: EFFEXOR-XR Take 37.5 mg by mouth daily.        Allergies:  Allergies  Allergen Reactions   Sulfonamide Derivatives Rash    Family History  Problem Relation Age of Onset   Hypertension Father    Heart attack Maternal Uncle     Social History:  reports that he quit smoking about 58 years ago. His smoking use included cigarettes. He started smoking about 76 years ago. He has a 18.1 pack-year smoking history. He quit smokeless tobacco use about 56 years ago.  His smokeless tobacco use included chew. He reports that he does not drink alcohol  and does not use drugs.  ROS:  A complete review of systems was performed.  All systems are negative except for pertinent findings as noted.  Physical Exam:  Vital signs in last 24 hours: There were no vitals taken for this visit. Constitutional:  Alert and oriented, No acute distress Cardiovascular: Regular rate  Respiratory: Normal respiratory effortl deficits Psychiatric: Normal mood and affect  I have reviewed prior pt notes  I have reviewed urinalysis results--clear today  I have reviewed the recent PSA and T results (PSA drawn at the [facility] fairly recently was 0.32)  Most recent hemoglobin reviewed--14.6.   Impression/Assessment:  Low testosterone -on repletion 0.75 mL every other week.  Feels like this is working well for him.  Laboratories checked today  Gross hematuria, resolved.  He was found to have a urethral stricture.  History of prostate cancer, status post radiotherapy.  Last PSA at the [facility] 0.32  Plan:  Testosterone  and PSA levels were drawn today-these will be in mid cycle levels of the testosterone   Continue testosterone  cypionate 150 mg IM every other week  I will see back  in about 6 months

## 2024-03-08 ENCOUNTER — Telehealth: Payer: Self-pay

## 2024-03-08 NOTE — Telephone Encounter (Signed)
 Pharmacist called to get clarification on injection needles for pt Rx pharmacist (gurline) was advise per Rx script pt is to receive 6 of each needles pharmacist made me aware that pt has been getting 23 gauge due to inventory issues pharmacist was advised that was fine

## 2024-03-15 DIAGNOSIS — H35033 Hypertensive retinopathy, bilateral: Secondary | ICD-10-CM | POA: Diagnosis not present

## 2024-03-15 DIAGNOSIS — H43813 Vitreous degeneration, bilateral: Secondary | ICD-10-CM | POA: Diagnosis not present

## 2024-03-15 DIAGNOSIS — Z961 Presence of intraocular lens: Secondary | ICD-10-CM | POA: Diagnosis not present

## 2024-03-15 DIAGNOSIS — H353113 Nonexudative age-related macular degeneration, right eye, advanced atrophic without subfoveal involvement: Secondary | ICD-10-CM | POA: Diagnosis not present

## 2024-03-15 DIAGNOSIS — H353124 Nonexudative age-related macular degeneration, left eye, advanced atrophic with subfoveal involvement: Secondary | ICD-10-CM | POA: Diagnosis not present

## 2024-03-15 DIAGNOSIS — H43393 Other vitreous opacities, bilateral: Secondary | ICD-10-CM | POA: Diagnosis not present

## 2024-03-15 DIAGNOSIS — H353211 Exudative age-related macular degeneration, right eye, with active choroidal neovascularization: Secondary | ICD-10-CM | POA: Diagnosis not present

## 2024-03-22 DIAGNOSIS — R7989 Other specified abnormal findings of blood chemistry: Secondary | ICD-10-CM | POA: Diagnosis not present

## 2024-05-01 DIAGNOSIS — E7800 Pure hypercholesterolemia, unspecified: Secondary | ICD-10-CM | POA: Diagnosis not present

## 2024-05-01 DIAGNOSIS — R5383 Other fatigue: Secondary | ICD-10-CM | POA: Diagnosis not present

## 2024-05-01 DIAGNOSIS — I10 Essential (primary) hypertension: Secondary | ICD-10-CM | POA: Diagnosis not present

## 2024-05-01 DIAGNOSIS — Z1339 Encounter for screening examination for other mental health and behavioral disorders: Secondary | ICD-10-CM | POA: Diagnosis not present

## 2024-05-01 DIAGNOSIS — Z0000 Encounter for general adult medical examination without abnormal findings: Secondary | ICD-10-CM | POA: Diagnosis not present

## 2024-05-01 DIAGNOSIS — Z79899 Other long term (current) drug therapy: Secondary | ICD-10-CM | POA: Diagnosis not present

## 2024-05-01 DIAGNOSIS — Z7189 Other specified counseling: Secondary | ICD-10-CM | POA: Diagnosis not present

## 2024-05-01 DIAGNOSIS — Z1331 Encounter for screening for depression: Secondary | ICD-10-CM | POA: Diagnosis not present

## 2024-05-01 DIAGNOSIS — Z23 Encounter for immunization: Secondary | ICD-10-CM | POA: Diagnosis not present

## 2024-05-01 DIAGNOSIS — I5030 Unspecified diastolic (congestive) heart failure: Secondary | ICD-10-CM | POA: Diagnosis not present

## 2024-05-01 DIAGNOSIS — Z299 Encounter for prophylactic measures, unspecified: Secondary | ICD-10-CM | POA: Diagnosis not present

## 2024-05-14 DIAGNOSIS — H353124 Nonexudative age-related macular degeneration, left eye, advanced atrophic with subfoveal involvement: Secondary | ICD-10-CM | POA: Diagnosis not present

## 2024-05-14 DIAGNOSIS — H353211 Exudative age-related macular degeneration, right eye, with active choroidal neovascularization: Secondary | ICD-10-CM | POA: Diagnosis not present

## 2024-05-28 DIAGNOSIS — M79675 Pain in left toe(s): Secondary | ICD-10-CM | POA: Diagnosis not present

## 2024-05-28 DIAGNOSIS — I739 Peripheral vascular disease, unspecified: Secondary | ICD-10-CM | POA: Diagnosis not present

## 2024-05-28 DIAGNOSIS — L110 Acquired keratosis follicularis: Secondary | ICD-10-CM | POA: Diagnosis not present

## 2024-05-28 DIAGNOSIS — L565 Disseminated superficial actinic porokeratosis (DSAP): Secondary | ICD-10-CM | POA: Diagnosis not present

## 2024-05-28 DIAGNOSIS — M79672 Pain in left foot: Secondary | ICD-10-CM | POA: Diagnosis not present

## 2024-05-28 DIAGNOSIS — M79674 Pain in right toe(s): Secondary | ICD-10-CM | POA: Diagnosis not present

## 2024-05-28 DIAGNOSIS — M79671 Pain in right foot: Secondary | ICD-10-CM | POA: Diagnosis not present

## 2024-09-11 ENCOUNTER — Ambulatory Visit: Admitting: Urology
